# Patient Record
Sex: Female | Born: 1975 | Race: Black or African American | Hispanic: No | Marital: Married | Smoking: Never smoker
Health system: Southern US, Community
[De-identification: ages and names within clinical notes are randomized; demographics above are authoritative.]

## PROBLEM LIST (undated history)

## (undated) ENCOUNTER — Inpatient Hospital Stay (HOSPITAL_COMMUNITY): Payer: Self-pay

## (undated) DIAGNOSIS — O139 Gestational [pregnancy-induced] hypertension without significant proteinuria, unspecified trimester: Secondary | ICD-10-CM

## (undated) DIAGNOSIS — E119 Type 2 diabetes mellitus without complications: Secondary | ICD-10-CM

## (undated) DIAGNOSIS — IMO0002 Reserved for concepts with insufficient information to code with codable children: Secondary | ICD-10-CM

## (undated) DIAGNOSIS — R112 Nausea with vomiting, unspecified: Secondary | ICD-10-CM

## (undated) DIAGNOSIS — T7840XA Allergy, unspecified, initial encounter: Secondary | ICD-10-CM

## (undated) DIAGNOSIS — R011 Cardiac murmur, unspecified: Secondary | ICD-10-CM

## (undated) DIAGNOSIS — Z8719 Personal history of other diseases of the digestive system: Secondary | ICD-10-CM

## (undated) DIAGNOSIS — Z9889 Other specified postprocedural states: Secondary | ICD-10-CM

## (undated) DIAGNOSIS — D649 Anemia, unspecified: Secondary | ICD-10-CM

## (undated) DIAGNOSIS — D573 Sickle-cell trait: Secondary | ICD-10-CM

## (undated) DIAGNOSIS — H269 Unspecified cataract: Secondary | ICD-10-CM

## (undated) DIAGNOSIS — R87619 Unspecified abnormal cytological findings in specimens from cervix uteri: Secondary | ICD-10-CM

## (undated) DIAGNOSIS — L709 Acne, unspecified: Secondary | ICD-10-CM

## (undated) HISTORY — DX: Gestational (pregnancy-induced) hypertension without significant proteinuria, unspecified trimester: O13.9

## (undated) HISTORY — DX: Cardiac murmur, unspecified: R01.1

## (undated) HISTORY — DX: Type 2 diabetes mellitus without complications: E11.9

## (undated) HISTORY — DX: Sickle-cell trait: D57.3

## (undated) HISTORY — DX: Acne, unspecified: L70.9

## (undated) HISTORY — PX: OTHER SURGICAL HISTORY: SHX169

## (undated) HISTORY — DX: Reserved for concepts with insufficient information to code with codable children: IMO0002

## (undated) HISTORY — DX: Allergy, unspecified, initial encounter: T78.40XA

## (undated) HISTORY — DX: Unspecified cataract: H26.9

## (undated) HISTORY — DX: Unspecified abnormal cytological findings in specimens from cervix uteri: R87.619

---

## 2005-10-09 ENCOUNTER — Emergency Department (HOSPITAL_COMMUNITY): Admission: EM | Admit: 2005-10-09 | Discharge: 2005-10-09 | Payer: Self-pay | Admitting: Emergency Medicine

## 2006-05-26 DIAGNOSIS — IMO0002 Reserved for concepts with insufficient information to code with codable children: Secondary | ICD-10-CM

## 2006-05-26 HISTORY — DX: Reserved for concepts with insufficient information to code with codable children: IMO0002

## 2007-02-18 ENCOUNTER — Other Ambulatory Visit: Admission: RE | Admit: 2007-02-18 | Discharge: 2007-02-18 | Payer: Self-pay | Admitting: Gynecology

## 2007-12-07 ENCOUNTER — Other Ambulatory Visit: Admission: RE | Admit: 2007-12-07 | Discharge: 2007-12-07 | Payer: Self-pay | Admitting: Gynecology

## 2008-04-19 ENCOUNTER — Ambulatory Visit: Payer: Self-pay | Admitting: Women's Health

## 2008-06-19 ENCOUNTER — Ambulatory Visit (HOSPITAL_COMMUNITY): Admission: RE | Admit: 2008-06-19 | Discharge: 2008-06-19 | Payer: Self-pay | Admitting: Gynecology

## 2008-07-13 ENCOUNTER — Ambulatory Visit: Payer: Self-pay | Admitting: Women's Health

## 2008-07-28 ENCOUNTER — Ambulatory Visit: Payer: Self-pay | Admitting: Gynecology

## 2009-05-04 ENCOUNTER — Ambulatory Visit: Payer: Self-pay | Admitting: Women's Health

## 2009-10-12 ENCOUNTER — Emergency Department (HOSPITAL_COMMUNITY): Admission: EM | Admit: 2009-10-12 | Discharge: 2009-10-12 | Payer: Self-pay | Admitting: Emergency Medicine

## 2010-03-26 HISTORY — PX: GASTRIC BYPASS: SHX52

## 2010-06-16 ENCOUNTER — Encounter: Payer: Self-pay | Admitting: Gynecology

## 2010-09-15 ENCOUNTER — Emergency Department (HOSPITAL_COMMUNITY)
Admission: EM | Admit: 2010-09-15 | Discharge: 2010-09-15 | Disposition: A | Discharge status: Home / Self Care | Payer: 59 | Attending: Emergency Medicine | Admitting: Emergency Medicine

## 2010-09-15 DIAGNOSIS — J3489 Other specified disorders of nose and nasal sinuses: Secondary | ICD-10-CM | POA: Insufficient documentation

## 2010-09-15 DIAGNOSIS — J069 Acute upper respiratory infection, unspecified: Secondary | ICD-10-CM | POA: Insufficient documentation

## 2010-09-15 DIAGNOSIS — R05 Cough: Secondary | ICD-10-CM | POA: Insufficient documentation

## 2010-09-15 DIAGNOSIS — R070 Pain in throat: Secondary | ICD-10-CM | POA: Insufficient documentation

## 2010-09-15 DIAGNOSIS — R059 Cough, unspecified: Secondary | ICD-10-CM | POA: Insufficient documentation

## 2010-12-12 ENCOUNTER — Encounter: Payer: Self-pay | Admitting: *Deleted

## 2010-12-18 ENCOUNTER — Ambulatory Visit (INDEPENDENT_AMBULATORY_CARE_PROVIDER_SITE_OTHER): Payer: 59 | Admitting: Women's Health

## 2010-12-18 ENCOUNTER — Encounter: Payer: Self-pay | Admitting: Women's Health

## 2010-12-18 ENCOUNTER — Other Ambulatory Visit (HOSPITAL_COMMUNITY)
Admission: RE | Admit: 2010-12-18 | Discharge: 2010-12-18 | Disposition: A | Discharge status: Home / Self Care | Payer: 59 | Source: Ambulatory Visit | Attending: Obstetrics and Gynecology | Admitting: Obstetrics and Gynecology

## 2010-12-18 VITALS — BP 130/70 | Ht 62.0 in | Wt 192.0 lb

## 2010-12-18 DIAGNOSIS — R81 Glycosuria: Secondary | ICD-10-CM

## 2010-12-18 DIAGNOSIS — Z01419 Encounter for gynecological examination (general) (routine) without abnormal findings: Secondary | ICD-10-CM | POA: Insufficient documentation

## 2010-12-18 DIAGNOSIS — R82998 Other abnormal findings in urine: Secondary | ICD-10-CM

## 2010-12-18 NOTE — Progress Notes (Signed)
Melissa Braun January 07, 1976 [id number]    History:    The patient presents for annual exam.  35 year old MBF G0 monthly 28 day cycles for about 7 days to up to 7 days are cramping with some heavy flow has had a history of infertility. She does have a history of a normal HSG, husband with normal SA.  history of diabetes with hypertension she is currently on no medications for either now. He had gastric bypass surgery in November of 2011. She has lost over 60 pounds with diet exercise and healthy her lifestyle. She's currently sees Dr. Talmage Nap . She has  followup scheduled.   Past medical history, past surgical history, family history and social history were all reviewed and documented in the EPIC chart.   ROS:  A 14 point ROS was performed and pertinent positives and negatives are included in the history.  Exam:  Filed Vitals:   12/18/10 1537  BP: 130/70    General appearance:  Normal Head/Neck:  Normal, without cervical or supraclavicular adenopathy. Thyroid:  Symmetrical, normal in size, without palpable masses or nodularity. Respiratory  Effort:  Normal  Auscultation:  Clear without wheezing or rhonchi Cardiovascular  Auscultation:  Regular rate, without rubs, murmurs or gallops  Edema/varicosities:  Not grossly evident Abdominal  Masses/tenderness:  Soft,nontender, without masses, guarding or rebound.  Liver/spleen:  No organomegaly noted  Hernia:  None appreciated  Occult test:   Skin  Inspection:  Grossly normal  Palpation:  Grossly normal Neurologic/psychiatric  Orientation:  Normal with appropriate conversation.  Mood/affect:  Normal  Genitourinary with chaperone present    Breasts: Examined lying and sitting.     Right: Without masses, retractions,         discharge or axillary adenopathy.     Left: Without masses, retractions,         discharge or axillary adenopathy.   Inguinal/mons:  Normal without inguinal adenopathy  External genitalia:   Normal  BUS/Urethra/Skene's glands:  Normal  Bladder:  Normal  Vagina:  Normal  Cervix:  Normal  Uterus:  anteverted, normal in size, shape and contour.  Midline and mobile  Adnexa/parametria:     Rt: Without masses or tenderness.   Lt: Without masses or tenderness.  Anus and perineum: Normal  Digital rectal exam: Normal sphincter tone without palpated masses or       tenderness.   Assessment/Plan:  35 y.o. year old female for annual exam.   History of infertility. Her last hemoglobin A1c with less than 9 which is much better for her to need care with Dr. balanced. She's currently on folic acid and a multivitamin and will continue. Did review of she would miss a cycle to return to the office for a viability ultrasound she has recently adopted a 1-month-old daughter named Melissa Braun.  Reviewed the importance of exercise, calcium rich diet, SVTs,. Reviewed exercise in relationship to health and diabetes maintenance. Pap and UA only today, she has had her labs at her primary care.   Harrington Challenger MD, 4:33 PM 12/18/2010

## 2010-12-19 ENCOUNTER — Telehealth: Payer: Self-pay | Admitting: Women's Health

## 2010-12-19 DIAGNOSIS — N390 Urinary tract infection, site not specified: Secondary | ICD-10-CM

## 2010-12-19 MED ORDER — SULFAMETHOXAZOLE-TMP DS 800-160 MG PO TABS: 1.0000 | ORAL_TABLET | Freq: Two times a day (BID) | ORAL | Status: AC

## 2010-12-19 NOTE — Telephone Encounter (Signed)
PT. NOTIFIED RX ESCRIBED TO HER PHARMACY AND TO RETURN FOR TOC UA  AND ORDER IN PC.

## 2010-12-19 NOTE — Telephone Encounter (Signed)
Sherri left message to call you directly.  100,000 on urine culture.  Allergic levaqun    Septra DS i po bid for 3 d #6  Please call in for and check TOC ua in 2 weeeks.  Thanks    Harriett Sine

## 2010-12-24 ENCOUNTER — Telehealth: Payer: Self-pay | Admitting: Women's Health

## 2010-12-24 DIAGNOSIS — B373 Candidiasis of vulva and vagina: Secondary | ICD-10-CM

## 2010-12-24 MED ORDER — FLUCONAZOLE 150 MG PO TABS: 150.0000 mg | ORAL_TABLET | Freq: Once | ORAL | Status: AC

## 2010-12-24 NOTE — Telephone Encounter (Signed)
Telephone call to patient. Informed Pap smear was normal but positive for yeast. Diflucan 150 by mouth x1 dose called to her pharmacy.

## 2011-02-20 ENCOUNTER — Emergency Department (HOSPITAL_COMMUNITY)
Admission: EM | Admit: 2011-02-20 | Discharge: 2011-02-20 | Disposition: A | Discharge status: Home / Self Care | Payer: No Typology Code available for payment source | Attending: Emergency Medicine | Admitting: Emergency Medicine

## 2011-02-20 ENCOUNTER — Emergency Department (HOSPITAL_COMMUNITY): Payer: No Typology Code available for payment source

## 2011-02-20 DIAGNOSIS — M542 Cervicalgia: Secondary | ICD-10-CM | POA: Insufficient documentation

## 2011-02-20 DIAGNOSIS — S139XXA Sprain of joints and ligaments of unspecified parts of neck, initial encounter: Secondary | ICD-10-CM | POA: Insufficient documentation

## 2011-08-13 ENCOUNTER — Telehealth: Payer: Self-pay | Admitting: *Deleted

## 2011-08-13 NOTE — Telephone Encounter (Signed)
Pt had questions about a possible in grown hair in vaginal area. All question anserine pt has appointment on 08/14/11

## 2011-08-14 ENCOUNTER — Encounter: Payer: Self-pay | Admitting: Women's Health

## 2011-08-14 ENCOUNTER — Ambulatory Visit (INDEPENDENT_AMBULATORY_CARE_PROVIDER_SITE_OTHER): Payer: 59 | Admitting: Women's Health

## 2011-08-14 DIAGNOSIS — B373 Candidiasis of vulva and vagina: Secondary | ICD-10-CM

## 2011-08-14 DIAGNOSIS — L738 Other specified follicular disorders: Secondary | ICD-10-CM

## 2011-08-14 DIAGNOSIS — L739 Follicular disorder, unspecified: Secondary | ICD-10-CM

## 2011-08-14 MED ORDER — FLUCONAZOLE 150 MG PO TABS: ORAL_TABLET | ORAL | Status: DC

## 2011-08-14 MED ORDER — SULFAMETHOXAZOLE-TRIMETHOPRIM 400-80 MG PO TABS: 1.0000 | ORAL_TABLET | Freq: Two times a day (BID) | ORAL | Status: DC

## 2011-08-14 NOTE — Patient Instructions (Signed)
Folliculitis  °Folliculitis is an infection and inflammation of the hair follicles. Hair follicles become red and irritated. This inflammation is usually caused by bacteria. The bacteria thrive in warm, moist environments. This condition can be seen anywhere on the body.  °CAUSES °The most common cause of folliculitis is an infection by germs (bacteria). Fungal and viral infections can also cause the condition. Viral infections may be more common in people whose bodies are unable to fight disease well (weakened immune systems). Examples include people with: °· AIDS.  °· An organ transplant.  °· Cancer.  °People with depressed immune systems, diabetes, or obesity, have a greater risk of getting folliculitis than the general population. Certain chemicals, especially oils and tars, also can cause folliculitis. °SYMPTOMS °· An early sign of folliculitis is a small, white or yellow pus-filled, itchy lesion (pustule). These lesions appear on a red, inflamed follicle. They are usually less than 5 mm (.20 inches).  °· The most likely starting points are the scalp, thighs, legs, back and buttocks. Folliculitis is also frequently found in areas of repeated shaving.  °· When an infection of the follicle goes deeper, it becomes a boil or furuncle. A group of closely packed boils create a larger lesion (a carbuncle). These sores (lesions) tend to occur in hairy, sweaty areas of the body.  °TREATMENT  °· A doctor who specializes in skin problems (dermatologists) treats mild cases of folliculitis with antiseptic washes.  °· They also use a skin application which kills germs (topical antibiotics). Tea tree oil is a good topical antiseptic as well. It can be found at a health food store. A small percentage of individuals may develop an allergy to the tea tree oil.  °· Mild to moderate boils respond well to warm water compresses applied three times daily.  °· In some cases, oral antibiotics should be taken with the skin treatment.    °· If lesions contain large quantities of pus or fluid, your caregiver may drain them. This allows the topical antibiotics to get to the affected areas better.  °· Stubborn cases of folliculitis may respond to laser hair removal. This process uses a high intensity light beam (a laser) to destroy the follicle and reduces the scarring from folliculitis. After laser hair removal, hair will no longer grow in the laser treated area.  °Patients with long-lasting folliculitis need to find out where the infection is coming from. Germs can live in the nostrils of the patient. This can trigger an outbreak now and then. Sometimes the bacteria live in the nostrils of a family member. This person does not develop the disorder but they repeatedly re-expose others to the germ. To break the cycle of recurrence in the patient, the family member must also undergo treatment. °PREVENTION  °· Individuals who are predisposed to folliculitis should be extremely careful about personal hygiene.  °· Application of antiseptic washes may help prevent recurrences.  °· A topical antibiotic cream, mupirocin (Bactroban®), has been effective at reducing bacteria in the nostrils. It is applied inside the nose with your little finger. This is done twice daily for a week. Then it is repeated every 6 months.  °· Because follicle disorders tend to come back, patients must receive follow-up care. Your caregiver may be able to recognize a recurrence before it becomes severe.  °SEEK IMMEDIATE MEDICAL CARE IF:  °· You develop redness, swelling, or increasing pain in the area.  °· You have a fever.  °· You are not improving with treatment   or are getting worse.  °· You have any other questions or concerns.  °Document Released: 07/21/2001 Document Revised: 05/01/2011 Document Reviewed: 05/17/2008 °[employer]® Patient Information ©2012 [employer]. °

## 2011-08-14 NOTE — Progress Notes (Signed)
Patient ID: Melissa Braun, female   DOB: 08-21-1975, 36 y.o.   MRN: [id number] Presents with complaint of painful ingrown hair on the mons pubis and questionable infection under her right axilla. History of diabetes currently on no medications due to weight loss resolving problem. Denies fever. Requested area to be opened and drained due to pain. States area started approximately one week ago after shaving and has gotten progressively larger and more tender.  Exam: Right axilla 2 cm slightly tender nonfluctuant folliculitis. On mons pubis 5 cm, indurated erythematous area, tender to touch. External genitalia extremely erythematous. Speculum exam cervix is pink moderate amount of a white discharge. Wet prep positive for yeast.Mons pubis prepped with Betadine, 2% plain Xylocaine used to anesthetize the area #11 blade used to open, drained small amount of serous drainage.  Folliculitis Yeast  Plan: Check random glucose. Septra DS one by mouth twice a day for 10 days, Diflucan 150 by mouth daily for 3 days with a refill. Instructed to soak in warm tub twice daily, loose clothes, Polysporin. Instructed to call if no relief or area not responding in 3 days. Patient felt better when leaving.

## 2011-08-15 LAB — GLUCOSE, RANDOM: Glucose, Bld: 314 mg/dL — ABNORMAL HIGH (ref 70–99)

## 2011-08-17 ENCOUNTER — Encounter (HOSPITAL_COMMUNITY): Payer: Self-pay | Admitting: *Deleted

## 2011-08-17 ENCOUNTER — Emergency Department (HOSPITAL_COMMUNITY)
Admission: EM | Admit: 2011-08-17 | Discharge: 2011-08-18 | Disposition: A | Discharge status: Home / Self Care | Payer: 59 | Attending: Emergency Medicine | Admitting: Emergency Medicine

## 2011-08-17 DIAGNOSIS — E119 Type 2 diabetes mellitus without complications: Secondary | ICD-10-CM | POA: Insufficient documentation

## 2011-08-17 DIAGNOSIS — L02412 Cutaneous abscess of left axilla: Secondary | ICD-10-CM

## 2011-08-17 DIAGNOSIS — X58XXXA Exposure to other specified factors, initial encounter: Secondary | ICD-10-CM | POA: Insufficient documentation

## 2011-08-17 DIAGNOSIS — R062 Wheezing: Secondary | ICD-10-CM | POA: Insufficient documentation

## 2011-08-17 DIAGNOSIS — IMO0002 Reserved for concepts with insufficient information to code with codable children: Secondary | ICD-10-CM | POA: Insufficient documentation

## 2011-08-17 DIAGNOSIS — I10 Essential (primary) hypertension: Secondary | ICD-10-CM | POA: Insufficient documentation

## 2011-08-17 DIAGNOSIS — L02411 Cutaneous abscess of right axilla: Secondary | ICD-10-CM

## 2011-08-17 DIAGNOSIS — L509 Urticaria, unspecified: Secondary | ICD-10-CM | POA: Insufficient documentation

## 2011-08-17 DIAGNOSIS — T7840XA Allergy, unspecified, initial encounter: Secondary | ICD-10-CM | POA: Insufficient documentation

## 2011-08-17 MED ORDER — DIPHENHYDRAMINE HCL 25 MG PO CAPS
50.0000 mg | ORAL_CAPSULE | Freq: Once | ORAL | Status: AC
  Administered 2011-08-17: 50 mg via ORAL
  Filled 2011-08-17: qty 2

## 2011-08-17 NOTE — ED Notes (Signed)
Pt has been taking antibiotics and has been having increasing hives.  Pt has generalized rash, hives.  No sob

## 2011-08-18 MED ORDER — PREDNISONE 20 MG PO TABS
60.0000 mg | ORAL_TABLET | Freq: Once | ORAL | Status: AC
  Administered 2011-08-18: 60 mg via ORAL
  Filled 2011-08-18: qty 3

## 2011-08-18 MED ORDER — CLINDAMYCIN HCL 150 MG PO CAPS: 300.0000 mg | ORAL_CAPSULE | Freq: Three times a day (TID) | ORAL | Status: AC

## 2011-08-18 MED ORDER — FAMOTIDINE 20 MG PO TABS
20.0000 mg | ORAL_TABLET | Freq: Once | ORAL | Status: AC
  Administered 2011-08-18: 20 mg via ORAL
  Filled 2011-08-18: qty 1

## 2011-08-18 MED ORDER — PREDNISONE 20 MG PO TABS: 40.0000 mg | ORAL_TABLET | Freq: Every day | ORAL | Status: AC

## 2011-08-18 NOTE — ED Provider Notes (Signed)
History     CSN: [id number]  Arrival date & time 08/17/11  2113   First MD Initiated Contact with Patient 08/18/11 0012      Chief Complaint  Patient presents with  . Allergic Reaction    (Consider location/radiation/quality/duration/timing/severity/associated sxs/prior treatment) Patient is a 36 y.o. female presenting with allergic reaction. The history is provided by the patient.  Allergic Reaction The primary symptoms are  wheezing, rash and urticaria. The primary symptoms do not include shortness of breath, cough, nausea, vomiting, dizziness, palpitations or angioedema. The current episode started more than 2 days ago. The problem has been gradually improving. This is a new problem.  The onset of the reaction was associated with a new medication.   patient reports she was started on Septra this past Thursday for an infected hair follicle by her GYN physician. After 24 hours of the Septra patient developed severe rash and itching. She calls GYN back, Septra was stopped and patient was started on Doxycycline on radiate millimeters images it appears that she can by the time patient started the doxycycline on Saturday her itching and rash had almost resolved. After second dose of doxycycline patient had return of rash and severe itching. Prior to arrival patient with severe itching to torso upper extremities upper legs and palms of bilateral hands. Rash and itching spared face. No significant swelling noted patient admits that the infected hair follicle on her pubic area has greatly improved. But now she is concerned regarding the fact that she doesn't feel safe continuing either of these antibiotics. Patient also has access to her right axilla and to her left anterior axilla that are TTP as well.   Past Medical History  Diagnosis Date  . Diabetes mellitus AGE 41    DR Talmage Nap  . Infertility   . Hypertension   . Acne   . LGSIL (low grade squamous intraepithelial dysplasia) 2008    Past  Surgical History  Procedure Date  . Exploratory laparoscopy     DUE TO RLQ PAIN  . Gastric bypass nov. 2011    [facility] [place]    Family History  Problem Relation Age of Onset  . Diabetes Father   . Hypertension Father   . Cancer Father     prostate    History  Substance Use Topics  . Smoking status: Never Smoker   . Smokeless tobacco: Never Used  . Alcohol Use: No    OB History    Grav Para Term Preterm Abortions TAB SAB Ect Mult Living   0               Review of Systems  Constitutional: Negative.   HENT: Negative.   Eyes: Negative.   Respiratory: Positive for wheezing. Negative for cough, chest tightness and shortness of breath.   Cardiovascular: Negative.  Negative for palpitations.  Gastrointestinal: Negative.  Negative for nausea and vomiting.  Genitourinary: Negative.   Musculoskeletal: Negative.   Skin: Positive for rash.  Neurological: Negative.  Negative for dizziness.  Hematological: Negative.   Psychiatric/Behavioral: Negative.     Allergies  Bactrim; Doxycycline; and Levaquin  Home Medications   Current Outpatient Rx  Name Route Sig Dispense Refill  . AMPICILLIN 250 MG PO CAPS Oral Take 250 mg by mouth 2 (two) times daily.      Marland Kitchen DOXYCYCLINE HYCLATE 100 MG PO CAPS Oral Take 100 mg by mouth 2 (two) times daily.    Marland Kitchen FOLIC ACID PO Oral Take 1 tablet by mouth daily.     Marland Kitchen  ONE-DAILY MULTI VITAMINS PO TABS Oral Take 1 tablet by mouth daily.      . SULFAMETHOXAZOLE-TRIMETHOPRIM 400-80 MG PO TABS Oral Take 1 tablet by mouth 2 (two) times daily.      BP 137/58  Pulse 120  Temp 98.7 F (37.1 C)  Resp 22  SpO2 100%  LMP 07/25/2011  Physical Exam  Constitutional: She appears well-developed and well-nourished.  HENT:  Head: Normocephalic and atraumatic.  Eyes: Conjunctivae are normal.  Neck: Neck supple.  Cardiovascular: Normal rate and regular rhythm.   Pulmonary/Chest: Effort normal and breath sounds normal.  Abdominal: Soft. Bowel sounds are  normal.  Musculoskeletal: Normal range of motion.  Neurological: She is alert.  Skin: Skin is warm and dry. Rash noted. Rash is urticarial. No erythema.       Raised red rash noted to lower back consistent with urticaria. Palmar aspects of bilateral hands noted to be very red.  Abscess of mons pubis is draining and soft and patient admits much improved. There is an abscess anteriorly to the right axilla that is firm, raised (approximately 2 x 2 in size) with minimal redness, very firm to touch without palpable fluctuance.  The abscess anterior to the left axilla is also raised, minimal to no erythema and appears to be a resolving abscess as there is no palpable fluctuance and minimal TTP.  Psychiatric: She has a normal mood and affect.    ED Course  Procedures   Findings and clinical impression discussed with patient. Will plan to discharge home on clindamycin and encourage followup either here or at the home urgent care in 2 days for recheck of these areas. Patient is to return sooner if her allergic symptoms return and worsen. We'll treat with prednisone and Pepcid and Benadryl for one week. Patient agreeable with plan.   Labs Reviewed - No data to display No results found.   No diagnosis found.    MDM  HPI/PE and clinical findings c/w 1. Allergic reaction (symptoms much improved after Benadryl. Minimal itching and rash at time of discharge, patient denies significant swelling, shortness of breath or wheezing at any time during the symptoms. Will reat with Prednisone, Pepcid and Benadryl x1 week and change patient's antibiotic. She is to follow up in 2 days for recheck either here or at [facility]. 2. Multiple abscesses (patient with a resolving abscess to the mons pubis, almost completely resolved abscess to anterior left axilla, approximately 2 x 2 centimeter raised, firm abscess to anterior right axilla that is TTP without palpable fluctuance. Abscesses to bilateral axilla  circled for reevaluation in 2 days after 2 days of clindamycin.)        Leanne Chang, NP 08/18/11 1610

## 2011-08-18 NOTE — ED Provider Notes (Signed)
Medical screening examination/treatment/procedure(s) were performed by non-physician practitioner and as supervising physician I was immediately available for consultation/collaboration.  Rhema Boyett K Sadik Piascik-Rasch, MD 08/18/11 0742 

## 2011-08-18 NOTE — Discharge Instructions (Signed)
Please review the instructions below. Your were evaluated in the emergency department tonight for your itchy rash. Your symptoms are consistent with and allergic reaction. I would stop the Septra and Doxycycline and consider yourself allergic to these medications in the future. Take the Prednisone as directed daily for 5 days. Get over-the-counter Pepcid and take 20 mg 2 times a day for 5 days. Also get over-the-counter Benadryl and take 25-50 mg every 4-6 hours as needed for itching. Try to take the Benadryl as regularly as possible for the next 2-3 days. If your symptoms worsen return immediately. Otherwise return either here or to the [facility] in 2 days so that we can reassess your allergic symptoms as well as the status of your multiple abscesses after 2 days of the new antibiotic.    Abscess An abscess (boil or furuncle) is an infected area under your skin. This area is filled with yellowish white fluid (pus). HOME CARE   Only take medicine as told by your doctor.   Keep the skin clean around your abscess. Keep clothes that may touch the abscess clean.   Change any bandages (dressings) as told by your doctor.   Avoid direct skin contact with other people. The infection can spread by skin contact with others.   Practice good hygiene and do not share personal care items.   Do not share athletic equipment, towels, or whirlpools. Shower after every practice or work out session.   If a draining area cannot be covered:   Do not play sports.   Children should not go to daycare until the wound has healed or until fluid (drainage) stops coming out of the wound.   See your doctor for a follow-up visit as told.  GET HELP RIGHT AWAY IF:   There is more pain, puffiness (swelling), and redness in the wound site.   There is fluid or bleeding from the wound site.   You have muscle aches, chills, fever, or feel sick.   You or your child has a temperature by mouth above 102 F (38.9  C), not controlled by medicine.   Your baby is older than 3 months with a rectal temperature of 102 F (38.9 C) or higher.  MAKE SURE YOU:   Understand these instructions.   Will watch your condition.   Will get help right away if you are not doing well or get worse.  Document Released: 10/29/2007 Document Revised: 05/01/2011 Document Reviewed: 10/29/2007 [facility] Patient Information 2012 [place], [place].Allergic Reaction Allergic reactions can be caused by anything your body is sensitive to. Your body may be sensitive to food, medicines, molds, pollens, cockroaches, dust mites, pets, insect stings, and other things around you. An allergic reaction may cause puffiness (swelling), itching, sneezing, coughing, or problems breathing.  Allergies cannot be cured, but they can be controlled with medicine. Some allergies happen only at certain times of the year. Try to stay away from what causes your reaction if possible. Sometimes, it is hard to tell what causes your reaction. HOME CARE If you have a rash or red patches (hives) on your skin:  Take medicines as told by your doctor.   Do not drive or drink alcohol after taking medicines. They can make you sleepy.   Put cold cloths on your skin. Take baths in cool water. This will help your itching. Do not take hot baths or showers. Heat will make the itching worse.   If your allergies get worse, your doctor might give you  other medicines. Talk to your doctor if problems continue.  GET HELP RIGHT AWAY IF:   You have trouble breathing.   You have a tight feeling in your chest or throat.   Your mouth gets puffy (swollen).   You have red, itchy patches on your skin (hives) that get worse.   You have itching all over your body.  MAKE SURE YOU:   Understand these instructions.   Will watch your condition.   Will get help right away if you are not doing well or get worse.  Document Released: 04/30/2009 Document Revised: 05/01/2011  Document Reviewed: 04/30/2009 [facility] Patient Information 2012 [place], [place].

## 2011-08-20 ENCOUNTER — Telehealth: Payer: Self-pay | Admitting: *Deleted

## 2011-08-20 NOTE — Telephone Encounter (Signed)
Message copied by Libby Maw on Wed Aug 20, 2011 11:58 AM ------      Message from: [place], [place] J      Created: Mon Aug 18, 2011  8:21 AM       Margorie Renner, please schedule appointment with Dr. Talmage Nap. Patient out of town week of April 1. Late afternoon best for patient. HAs seen in the past, had been on insulin and in the past, blood sugar now 314 on no medication. Had major weight loss

## 2011-08-20 NOTE — Telephone Encounter (Signed)
appt set up with Dr. Talmage Nap on 09/09/11 @ 12:15. Records sent.

## 2011-11-23 ENCOUNTER — Emergency Department (HOSPITAL_COMMUNITY)
Admission: EM | Admit: 2011-11-23 | Discharge: 2011-11-23 | Disposition: A | Discharge status: Home / Self Care | Payer: 59 | Attending: Emergency Medicine | Admitting: Emergency Medicine

## 2011-11-23 ENCOUNTER — Encounter (HOSPITAL_COMMUNITY): Payer: Self-pay | Admitting: Emergency Medicine

## 2011-11-23 DIAGNOSIS — Z794 Long term (current) use of insulin: Secondary | ICD-10-CM | POA: Insufficient documentation

## 2011-11-23 DIAGNOSIS — R4182 Altered mental status, unspecified: Secondary | ICD-10-CM | POA: Insufficient documentation

## 2011-11-23 DIAGNOSIS — E1169 Type 2 diabetes mellitus with other specified complication: Secondary | ICD-10-CM | POA: Insufficient documentation

## 2011-11-23 DIAGNOSIS — E162 Hypoglycemia, unspecified: Secondary | ICD-10-CM

## 2011-11-23 LAB — BASIC METABOLIC PANEL
CO2: 24 mEq/L (ref 19–32)
Calcium: 8.9 mg/dL (ref 8.4–10.5)
Chloride: 105 mEq/L (ref 96–112)
Glucose, Bld: 58 mg/dL — ABNORMAL LOW (ref 70–99)
Sodium: 141 mEq/L (ref 135–145)

## 2011-11-23 LAB — CBC WITH DIFFERENTIAL/PLATELET
Basophils Absolute: 0 10*3/uL (ref 0.0–0.1)
Eosinophils Relative: 1 % (ref 0–5)
HCT: 35.1 % — ABNORMAL LOW (ref 36.0–46.0)
Lymphocytes Relative: 14 % (ref 12–46)
Lymphs Abs: 2 10*3/uL (ref 0.7–4.0)
MCV: 71.2 fL — ABNORMAL LOW (ref 78.0–100.0)
Monocytes Absolute: 0.7 10*3/uL (ref 0.1–1.0)
Neutro Abs: 11.3 10*3/uL — ABNORMAL HIGH (ref 1.7–7.7)
Platelets: 201 10*3/uL (ref 150–400)
RBC: 4.93 MIL/uL (ref 3.87–5.11)
WBC: 14.2 10*3/uL — ABNORMAL HIGH (ref 4.0–10.5)

## 2011-11-23 LAB — GLUCOSE, CAPILLARY
Glucose-Capillary: 64 mg/dL — ABNORMAL LOW (ref 70–99)
Glucose-Capillary: 108 mg/dL — ABNORMAL HIGH (ref 70–99)

## 2011-11-23 MED ORDER — DEXTROSE 50 % IV SOLN
INTRAVENOUS | Status: AC
  Administered 2011-11-23: 04:00:00
  Filled 2011-11-23: qty 50

## 2011-11-23 NOTE — ED Provider Notes (Signed)
I assumed care of patient from Dr. Bebe Shaggy at 7 AM. Decreased mental status secondary to hypoglycemia. Feeling better.  Has had 3 BS >100 and feels ready to go home.  BP 123/69  Pulse 87  Temp 97.5 F (36.4 C) (Oral)  Resp 13  SpO2 100%   Glynn Octave, MD 11/23/11 [phone]

## 2011-11-23 NOTE — ED Notes (Signed)
Patient given orange juice to drink.

## 2011-11-23 NOTE — Discharge Instructions (Signed)
Low Blood Sugar Low blood sugar (hypoglycemia) means that the level of sugar in your blood is lower than it should be. Signs of low blood sugar include:  Getting sweaty.   Feeling hungry.   Feeling dizzy or weak.   Feeling sleepier than normal.   Feeling nervous.   Headaches.   Having a fast heartbeat.  Low blood sugar can happen fast and can be an emergency. Your doctor can do tests to check your blood sugar level. You can have low blood sugar and not have diabetes. HOME CARE  Check your blood sugar as told by your doctor. If it is less than 70 mg/dl or as told by your doctor, take 1 of the following:   3 to 4 glucose tablets.    cup clear juice.    cup soda pop, not diet.   1 cup milk.   5 to 6 hard candies.   Recheck blood sugar after 15 minutes. Repeat until it is at the right level.   Eat a snack if it is more than 1 hour until the next meal.   Only take medicine as told by your doctor.   Do not skip meals. Eat on time.   Do not drink alcohol except with meals.   Check your blood glucose before driving.   Check your blood glucose before and after exercise.   Always carry treatment with you, such as glucose pills.   Always wear a medical alert bracelet if you have diabetes.  GET HELP RIGHT AWAY IF:   Your blood glucose goes below 70 mg/dl or as told by your doctor, and you:   Are confused.   Are not able to swallow.   Pass out (faint).   You cannot treat yourself. You may need someone to help you.   You have low blood sugar problems often.   You have problems from your medicines.   You are not feeling better after 3 to 4 days.   You have vision changes.  MAKE SURE YOU:   Understand these instructions.   Will watch this condition.   Will get help right away if you are not doing well or get worse.  Document Released: 08/06/2009 Document Revised: 05/01/2011 Document Reviewed: 08/06/2009 [facility] Patient Information 2012 [place],  [place].  BE SURE TO CHECK YOUR SUGAR FREQUENTLY TODAY.  PLEASE CALL YOUR DOCTOR FOR FURTHER INSTRUCTIONS ON YOUR INSULIN DOSING

## 2011-11-23 NOTE — ED Notes (Signed)
Report given to oncoming RN.

## 2011-11-23 NOTE — ED Notes (Signed)
Pt's CBG is 64 an letting Rn Steward Drone know.3:41am JG.

## 2011-11-23 NOTE — ED Notes (Signed)
Patient eating graham crackers with a cup of ginger ale.  Patient much more alert and awake at this time.  Patient has no other complaints.

## 2011-11-23 NOTE — ED Provider Notes (Signed)
History     CSN: [id number]  Arrival date & time 11/23/11  0325   First MD Initiated Contact with Patient 11/23/11 [phone]      Chief Complaint  Patient presents with  . Altered Mental Status     Patient is a 36 y.o. female presenting with altered mental status. The history is provided by the patient.  Altered Mental Status This is a new problem. Episode onset: an unknown time ago. The problem occurs constantly. The problem has been gradually improving. Pertinent negatives include no chest pain, no abdominal pain, no headaches and no shortness of breath. Nothing aggravates the symptoms. Relieved by: glucose. Treatments tried: glucose. The treatment provided mild relief.  pt presents for altered mental status Apparently EMS arrived checked glucose and it was 40 Given D50 with some improvement in mental status On arrival glucose was 64 Pt reports taking only insulin She denies cp/sob/headache/fever or recent infections  Past Medical History  Diagnosis Date  . Diabetes mellitus AGE 18    DR Talmage Nap  . Infertility   . Hypertension   . Acne   . LGSIL (low grade squamous intraepithelial dysplasia) 2008    Past Surgical History  Procedure Date  . Exploratory laparoscopy     DUE TO RLQ PAIN  . Gastric bypass nov. 2011    cary Hayti    Family History  Problem Relation Age of Onset  . Diabetes Father   . Hypertension Father   . Cancer Father     prostate    History  Substance Use Topics  . Smoking status: Never Smoker   . Smokeless tobacco: Never Used  . Alcohol Use: No    OB History    Grav Para Term Preterm Abortions TAB SAB Ect Mult Living   0               Review of Systems  Respiratory: Negative for shortness of breath.   Cardiovascular: Negative for chest pain.  Gastrointestinal: Negative for abdominal pain.  Neurological: Negative for headaches.  Psychiatric/Behavioral: Positive for altered mental status.  All other systems reviewed and are  negative.    Allergies  Bactrim; Doxycycline; and Levofloxacin  Home Medications   Current Outpatient Rx  Name Route Sig Dispense Refill  . AMPICILLIN 250 MG PO CAPS Oral Take 250 mg by mouth 2 (two) times daily.      Marland Kitchen DOXYCYCLINE HYCLATE 100 MG PO CAPS Oral Take 100 mg by mouth 2 (two) times daily.    Marland Kitchen FOLIC ACID PO Oral Take 1 tablet by mouth daily.     Marland Kitchen ONE-DAILY MULTI VITAMINS PO TABS Oral Take 1 tablet by mouth daily.        BP 132/72  Pulse 90  Temp 97.5 F (36.4 C) (Oral)  Resp 14  SpO2 98%  Physical Exam CONSTITUTIONAL: Well developed/well nourished HEAD AND FACE: Normocephalic/atraumatic EYES: EOMI/PERRL ENMT: Mucous membranes moist NECK: supple no meningeal signs SPINE:entire spine nontender CV: S1/S2 noted, no murmurs/rubs/gallops noted LUNGS: Lungs are clear to auscultation bilaterally, no apparent distress ABDOMEN: soft, nontender, no rebound or guarding GU:no cva tenderness NEURO: sleeping on arrival but easily arousable, no arm/leg drift, no facial droop noted EXTREMITIES: pulses normal, full ROM SKIN: warm, color normal PSYCH: no abnormalities of mood noted  ED Course  Procedures    Labs Reviewed  CBC WITH DIFFERENTIAL  BASIC METABOLIC PANEL   Pt with hypoglycemia that is improving Will follow closely 6:14 AM Mental status much improved Taking PO  6:26 AM Pt reports was well yesterday, took her evening insulin prior to bedtime Her husband (who is on the phone) noted that she was diaphoretic, confused at home.  EMS called and hypoglycemia noted She denies cp/sob/fever/weakness/dysuria/abdominal pain recently 8:06 AM WILL MONITOR IN ED ANOTHER HOUR THEN RECHECK GLUCOSE.  IF STILL ABOVE 100, WILL BE DISCHARGED HOME, D/W RANCOUR AT [facility]  MDM  Nursing notes including past medical history and social history reviewed and considered in documentation All labs/vitals reviewed and considered Previous records reviewed and considered - recent ED  visit reviewed         Joya Gaskins, MD 11/23/11 [phone]

## 2011-11-23 NOTE — ED Notes (Signed)
IV 18g in right AC was D/Cd

## 2011-11-23 NOTE — ED Notes (Signed)
Per EMS patient had altered mental status and CBG 40, 1 amp D50 given and recheck of 148.  Patient does not want to talk to staff.

## 2012-01-01 ENCOUNTER — Encounter: Payer: Self-pay | Admitting: Women's Health

## 2012-01-01 ENCOUNTER — Ambulatory Visit (INDEPENDENT_AMBULATORY_CARE_PROVIDER_SITE_OTHER): Payer: 59 | Admitting: Women's Health

## 2012-01-01 VITALS — BP 126/78 | Ht 62.0 in | Wt 175.0 lb

## 2012-01-01 DIAGNOSIS — Z01419 Encounter for gynecological examination (general) (routine) without abnormal findings: Secondary | ICD-10-CM

## 2012-01-01 DIAGNOSIS — E139 Other specified diabetes mellitus without complications: Secondary | ICD-10-CM | POA: Insufficient documentation

## 2012-01-01 DIAGNOSIS — E119 Type 2 diabetes mellitus without complications: Secondary | ICD-10-CM

## 2012-01-01 NOTE — Patient Instructions (Addendum)

## 2012-01-01 NOTE — Progress Notes (Signed)
Melissa Braun 1976/02/15 [id number]    History:    The patient presents for annual exam.  Regular monthly 6-7 day cycle using no contraception. History of normal Paps. Type I diabetic on insulin - Dr. Talmage Nap.   Past medical history, past surgical history, family history and social history were all reviewed and documented in the EPIC chart. Has 2 adopted children [name] age 36 and a son 83 months. Had gastric bypass surgery has lost approximately 100 pounds. Works at [employer].   ROS:  A  ROS was performed and pertinent positives and negatives are included in the history.  Exam:  Filed Vitals:   01/01/12 1559  BP: 126/78    General appearance:  Normal Head/Neck:  Normal, without cervical or supraclavicular adenopathy. Thyroid:  Symmetrical, normal in size, without palpable masses or nodularity. Respiratory  Effort:  Normal  Auscultation:  Clear without wheezing or rhonchi Cardiovascular  Auscultation:  Regular rate, without rubs, murmurs or gallops  Edema/varicosities:  Not grossly evident Abdominal  Soft,nontender, without masses, guarding or rebound.  Liver/spleen:  No organomegaly noted  Hernia:  None appreciated  Skin  Inspection:  Grossly normal  Palpation:  Grossly normal Neurologic/psychiatric  Orientation:  Normal with appropriate conversation.  Mood/affect:  Normal  Genitourinary    Breasts: Examined lying and sitting.     Right: Without masses, retractions, discharge or axillary adenopathy.     Left: Without masses, retractions, discharge or axillary adenopathy.   Inguinal/mons:  Normal without inguinal adenopathy  External genitalia:  Normal  BUS/Urethra/Skene's glands:  Normal  Bladder:  Normal  Vagina:  Normal  Cervix:  Normal  Uterus:   normal in size, shape and contour.  Midline and mobile  Adnexa/parametria:     Rt: Without masses or tenderness.   Lt: Without masses or tenderness.  Anus and perineum: Normal  Digital rectal exam: Normal sphincter  tone without palpated masses or tenderness  Assessment/Plan:  36 y.o. MBF G0  for annual exam with no complaints.  Normal GYN exam Type I diabetic since age 36-Dr. Talmage Nap labs and meds  Plan: Contraception reviewed declines if conceives will deal with it. States has tried to conceive for many years without success and has now adopted 2 children. SBE's, exercise, calcium rich diet, MVI daily encouraged. History of normal Paps, no Pap new screening guidelines reviewed.    Harrington Challenger [facility] [place], 4:58 PM 01/01/2012

## 2012-01-21 ENCOUNTER — Ambulatory Visit (INDEPENDENT_AMBULATORY_CARE_PROVIDER_SITE_OTHER): Payer: 59 | Admitting: Women's Health

## 2012-01-21 ENCOUNTER — Encounter: Payer: Self-pay | Admitting: Women's Health

## 2012-01-21 DIAGNOSIS — N912 Amenorrhea, unspecified: Secondary | ICD-10-CM

## 2012-01-21 NOTE — Progress Notes (Signed)
Patient ID: Melissa Braun, female   DOB: Mar 04, 1976, 36 y.o.   MRN: [id number] Presents with home positive U PT. LMP 12/21/11 normal cycle. History of monthly every 28 day cycles, using no contraception, history of infertility. Currently on insulin at at bedtime history of insulin-dependent diabetes diagnosed at age 78. Gastric Bypass surgery 11/11 with approximately 100 pound weight loss. Denies any bleeding, discharge, pain, or nausea. Pleased with pregnancy but surprised. Has 2 adopted children [name] 2-1/2 and a son 17 months.  Exam: Positive U PT External genitalia within normal limits, speculum exam no blood or discharge noted. Bimanual no CMT or adnexal fullness or tenderness, uterus palpates 6-8 weeks' size.  Early pregnancy/5 weeks 6 days per dates with  Insulin-dependent diabetes and history of gastric bypass  Plan: Refer to perinatologist. Dr. Sherrie [facility] at [facility] contacted and will contact patient for new OB appointment. Instructed to continue insulin, prenatal vitamin daily, and keep scheduled appointment.

## 2012-01-21 NOTE — Patient Instructions (Addendum)
No meds other than insulin, prenatal vit and plain tylenol or robitussinPregnancy If you are planning on getting pregnant, it is a good idea to make a preconception appointment with your care- giver to discuss having a healthy lifestyle before getting pregnant. Such as, diet, weight, exercise, taking prenatal vitamins especially folic acid (it helps prevent brain and spinal cord defects), avoiding alcohol, smoking and illegal drugs, medical problems (diabetes, convulsions), family history of genetic problems, working conditions and immunizations. It is better to have knowledge of these things and do something about them before getting pregnant. In your pregnancy, it is important to follow certain guidelines to have a healthy baby. It is very important to get good prenatal care and follow your caregiver's instructions. Prenatal care includes all the medical care you receive before your baby's birth. This helps to prevent problems during the pregnancy and childbirth. HOME CARE INSTRUCTIONS   Start your prenatal visits by the 12th week of pregnancy or before when possible. They are usually scheduled monthly at first. They are more often in the last 2 months before delivery. It is important that you keep your caregiver's appointments and follow your caregiver's instructions regarding medication use, exercise, and diet.   During pregnancy, you are providing food for you and your baby. Eat a regular, well-balanced diet. Choose foods such as meat, fish, milk and other dairy products, vegetables, fruits, whole-grain breads and cereals. Your caregiver will inform you of the ideal weight gain depending on your current height and weight. Drink lots of liquids. Try to drink 8 glasses of water a day.   Alcohol is associated with a number of birth defects including fetal alcohol syndrome. It is best to avoid alcohol completely. Smoking will cause low birth rate and prematurity. Use of alcohol and nicotine during your  pregnancy also increases the chances that your child will be chemically dependent later in their life and may contribute to SIDS (Sudden Infant Death Syndrome).   Do not use illegal drugs.   Only take prescription or over-the-counter medications that are recommended by your caregiver. Other medications can cause genetic and physical problems in the baby.   Morning sickness can often be helped by keeping soda crackers at the bedside. Eat a couple before arising in the morning.   A sexual relationship may be continued until near the end of pregnancy if there are no other problems such as early (premature) leaking of amniotic fluid from the membranes, vaginal bleeding, painful intercourse or belly (abdominal) pain.   Exercise regularly. Check with your caregiver if you are unsure of the safety of some of your exercises.   Do not use hot tubs, steam rooms or saunas. These increase the risk of fainting or passing out and hurting yourself and the baby. Swimming is OK for exercise. Get plenty of rest, including afternoon naps when possible especially in the third trimester.   Avoid toxic odors and chemicals.   Do not wear high heels. They may cause you to lose your balance and fall.   Do not lift over 5 pounds. If you do lift anything, lift with your legs and thighs, not your back.   Avoid long trips, especially in the third trimester.   If you have to travel out of the city or state, take a copy of your medical records with you.  SEEK IMMEDIATE MEDICAL CARE IF:   You develop an unexplained oral temperature above 102 F (38.9 C), or as your caregiver suggests.   You have leaking  of fluid from the vagina. If leaking membranes are suspected, take your temperature and inform your caregiver of this when you call.   There is vaginal spotting or bleeding. Notify your caregiver of the amount and how many pads are used.   You continue to feel sick to your stomach (nauseous) and have no relief from  remedies suggested, or you throw up (vomit) blood or coffee ground like materials.   You develop upper abdominal pain.   You have round ligament discomfort in the lower abdominal area. This still must be evaluated by your caregiver.   You feel contractions of the uterus.   You do not feel the baby move, or there is less movement than before.   You have painful urination.   You have abnormal vaginal discharge.   You have persistent diarrhea.   You get a severe headache.   You have problems with your vision.   You develop muscle weakness.   You feel dizzy and faint.   You develop shortness of breath.   You develop chest pain.   You have back pain that travels down to your leg and feet.   You feel irregular or a very fast heartbeat.   You develop excessive weight gain in a short period of time (5 pounds in 3 to 5 days).   You are involved with a domestic violence situation.  Document Released: 05/12/2005 Document Revised: 05/01/2011 Document Reviewed: 11/03/2008 [facility] Patient Information 2012 [place], [place].

## 2012-02-03 ENCOUNTER — Telehealth: Payer: Self-pay | Admitting: *Deleted

## 2012-02-03 NOTE — Telephone Encounter (Signed)
Pt called requesting copy of positive preg. Test  01/21/12. Pt filled out medical release, copy faxed to pt at [phone].

## 2012-02-23 ENCOUNTER — Other Ambulatory Visit: Payer: Self-pay | Admitting: Family Medicine

## 2012-02-23 ENCOUNTER — Ambulatory Visit (INDEPENDENT_AMBULATORY_CARE_PROVIDER_SITE_OTHER): Payer: 59 | Admitting: Family Medicine

## 2012-02-23 ENCOUNTER — Encounter: Payer: Self-pay | Admitting: Family Medicine

## 2012-02-23 ENCOUNTER — Encounter: Payer: 59 | Attending: Family Medicine | Admitting: Dietician

## 2012-02-23 VITALS — BP 127/74 | Temp 97.5°F | Ht 61.25 in | Wt 180.9 lb

## 2012-02-23 DIAGNOSIS — O24919 Unspecified diabetes mellitus in pregnancy, unspecified trimester: Secondary | ICD-10-CM | POA: Insufficient documentation

## 2012-02-23 DIAGNOSIS — O10019 Pre-existing essential hypertension complicating pregnancy, unspecified trimester: Secondary | ICD-10-CM

## 2012-02-23 DIAGNOSIS — O09529 Supervision of elderly multigravida, unspecified trimester: Secondary | ICD-10-CM

## 2012-02-23 DIAGNOSIS — IMO0002 Reserved for concepts with insufficient information to code with codable children: Secondary | ICD-10-CM

## 2012-02-23 DIAGNOSIS — O99840 Bariatric surgery status complicating pregnancy, unspecified trimester: Secondary | ICD-10-CM | POA: Insufficient documentation

## 2012-02-23 DIAGNOSIS — O0990 Supervision of high risk pregnancy, unspecified, unspecified trimester: Secondary | ICD-10-CM

## 2012-02-23 DIAGNOSIS — I10 Essential (primary) hypertension: Secondary | ICD-10-CM

## 2012-02-23 DIAGNOSIS — Z713 Dietary counseling and surveillance: Secondary | ICD-10-CM | POA: Insufficient documentation

## 2012-02-23 DIAGNOSIS — E669 Obesity, unspecified: Secondary | ICD-10-CM

## 2012-02-23 DIAGNOSIS — Z3682 Encounter for antenatal screening for nuchal translucency: Secondary | ICD-10-CM

## 2012-02-23 DIAGNOSIS — O99810 Abnormal glucose complicating pregnancy: Secondary | ICD-10-CM | POA: Insufficient documentation

## 2012-02-23 LAB — POCT URINALYSIS DIP (DEVICE)
Bilirubin Urine: NEGATIVE
Hgb urine dipstick: NEGATIVE
Nitrite: NEGATIVE
Specific Gravity, Urine: 1.015 (ref 1.005–1.030)
pH: 7 (ref 5.0–8.0)

## 2012-02-23 MED ORDER — INSULIN NPH (HUMAN) (ISOPHANE) 100 UNIT/ML ~~LOC~~ SUSP: 10.0000 [IU] | Freq: Two times a day (BID) | SUBCUTANEOUS | Status: DC

## 2012-02-23 MED ORDER — GLUCOSE BLOOD VI STRP: ORAL_STRIP | Status: DC

## 2012-02-23 MED ORDER — INSULIN ASPART 100 UNIT/ML ~~LOC~~ SOLN: 12.0000 [IU] | Freq: Three times a day (TID) | SUBCUTANEOUS | Status: DC

## 2012-02-23 MED ORDER — ACCU-CHEK FASTCLIX LANCETS MISC: 1.0000 [IU] | Freq: Four times a day (QID) | Status: DC

## 2012-02-23 NOTE — Progress Notes (Signed)
  Subjective:    Melissa Braun is a 36 y.o. G1P0 [redacted]w[redacted]d being seen today for her obstetrical visit.  Patient reports no complaints. Fetal movement: not yet. Discussed DM at length.  Pt. Has adopted 2 children secondary to infertility and lost 80#, this is a spontaneous pregnancy.  Objective:    BP 127/74  Temp 97.5 F (36.4 C)  Ht 5' 1.25" (1.556 m)  Wt 180 lb 14.4 oz (82.056 kg)  BMI 33.90 kg/m2  LMP 12/11/2011  Physical Exam  HEENT:  [place]/AT, sclera without icterus Neck:  Supple, normal thyroid Lungs:   Clear bilaterally CV:  Reg S1S2, no murmur Abdomen:  Soft, NT Ext: tr edema L>R Skin:  Acne noted on face and chest Breasts:  Symmetric with everted nipples, no mass, no supraclavicular or axillary lymphadenopathy. GU:  NEFG, BUS WNL, Cervix nulliparous without lesion, uterus 12-14 wk size, firm. Exam  FHT:  169  Uterine Size:  12-14 wks size  Presentation:  unknown     Assessment:    Pregnancy:  G1P0   Patient Active Problem List   Diagnosis Date Noted  . Diabetes mellitus, antepartum 02/23/2012    Priority: High  . Benign essential hypertension antepartum 02/23/2012    Priority: High  . AMA (advanced maternal age) multigravida 35+ 02/23/2012    Priority: Medium  . Gastric bypass status for obesity complicating pregnancy, childbirth, or the puerperium 02/23/2012    Priority: Medium  . Essential hypertension, benign 02/23/2012  . Obesity 02/23/2012  . Unspecified high-risk pregnancy 02/23/2012  . Diabetes 1.5, managed as type 1 01/01/2012     Plan:   baseline labs, Hgb A1C, TSH, 24 hour urine, Fetal ECHO, Opthalmology referral MFM referral with [facility] vs. Other for AMA. Serial u/s for growth, 2x/wk testing > 32 wks. New OB labs Pt. Desires natural childbirth if possible, discussed water birth opportunities.   Follow up in 2 Weeks.

## 2012-02-23 NOTE — Progress Notes (Signed)
Nutrition note: 1st visit consult Pt has type 2 DM, had gastric bypass in 2011, and has h/o high BP. Pt stated she lost 75-80# after having gastric bypass and does not really follow any of the diet advice anymore except pt doesn't eat fruit due to MDs recs to avoid. Pt reports her BS normally run 80-90 for fasting & ~126 for 22 pp. Pt has gained 5.9# @ [redacted]w[redacted]d, which is > expected so far. Pt reports eating 3 meals & 2 snacks/ d. Pt reports drinking water, a little milk, and decaf/ sugar-free soda & tea. Pt is taking PNV & will be starting new insulin regime.  Pt reports no N&V.  Pt reports she is not getting any physical activity currently. Pt given verbal & written GDM education. Disc wt gain goals of 11-20#.  Disc importance of BF & physical activity such as walking. Pt agrees to follow GDM diet including 3 meals & 3 snacks with proper CHO/ protein combination.  Pt does not receive WIC but plans to apply. Pt does plan to BF. F/u in 2-4 wks Blondell Reveal, MS, RD, LDN

## 2012-02-23 NOTE — Progress Notes (Signed)
Diabetes Education:  Currently has been taking the Lantus and Novolog insulin.  Instructed regarding the use of the Novolin N insulin and the insulin curve.  She is to take 10 units of the Novolin N (NPH) in the AM and at bedtime.  The Novolog is 12 units before meals.  Instructed in the method for mixing the Novolin N and the Novolog insulin in the AM pre-breakfast.  Provided an insulin Kit BD Lot [id number] Expiration 2016/04/21.  Ask that she take a pre-lunch glucose for the next week.  She is to monitor her blood glucose fasting and 2 hr after the first bite of each meal.  Provide blood glucose log for recording blood glucose.  Did review the diet in light of her gastric bypass.  She is to limit breakfast to 30 gm of CHO, snacks are 15 gm of CHO and all meals are at 30 gm of CHO with addition of the protein and non-starchy vegetables.  It appears that her diet has been less restricted following her gastric bypass as she has only lost 75 lb total with the surgery.  Wncouraged aiming for the 60-90 mg for fasting and <120 mg for the 2 hours post-meal.  Her husband was present for the session.  Maggie Pat Sires, RN, CDE

## 2012-02-23 NOTE — Patient Instructions (Addendum)
Following an appropriate diet is the most important thing to do for your health and that of your unborn baby. Please keep accurate BS logs and bring them with you to every visit.  Check BS four times a day. Your goal of fasting BS is < 90, and 2 hours after meals is < 120.  Melissa Braun is safe to use in pregnancy.  Stop taking Sudafed and metoprolol.  Pregnancy - First Trimester During sexual intercourse, millions of sperm go into the vagina. Only 1 sperm will penetrate and fertilize the female egg while it is in the Fallopian tube. One week later, the fertilized egg implants into the wall of the uterus. An embryo begins to develop into a baby. At 6 to 8 weeks, the eyes and face are formed and the heartbeat can be seen on ultrasound. At the end of 12 weeks (first trimester), all the baby's organs are formed. Now that you are pregnant, you will want to do everything you can to have a healthy baby. Two of the most important things are to get good prenatal care and follow your caregiver's instructions. Prenatal care is all the medical care you receive before the baby's birth. It is given to prevent, find, and treat problems during the pregnancy and childbirth. PRENATAL EXAMS  During prenatal visits, your weight, blood pressure and urine are checked. This is done to make sure you are healthy and progressing normally during the pregnancy.   A pregnant woman should gain 25 to 35 pounds during the pregnancy. However, if you are over weight or underweight, your caregiver will advise you regarding your weight.   Your caregiver will ask and answer questions for you.   Blood work, cervical cultures, other necessary tests and a Pap test are done during your prenatal exams. These tests are done to check on your health and the probable health of your baby. Tests are strongly recommended and done for HIV with your permission. This is the virus that causes AIDS. These tests are done because medications can be given to  help prevent your baby from being born with this infection should you have been infected without knowing it. Blood work is also used to find out your blood type, previous infections and follow your blood levels (hemoglobin).   Low hemoglobin (anemia) is common during pregnancy. Iron and vitamins are given to help prevent this. Later in the pregnancy, blood tests for diabetes will be done along with any other tests if any problems develop. You may need tests to make sure you and the baby are doing well.   You may need other tests to make sure you and the baby are doing well.  CHANGES DURING THE FIRST TRIMESTER (THE FIRST 3 MONTHS OF PREGNANCY) Your body goes through many changes during pregnancy. They vary from person to person. Talk to your caregiver about changes you notice and are concerned about. Changes can include:  Your menstrual period stops.   The egg and sperm carry the genes that determine what you look like. Genes from you and your partner are forming a baby. The female genes determine whether the baby is a boy or a girl.   Your body increases in girth and you may feel bloated.   Feeling sick to your stomach (nauseous) and throwing up (vomiting). If the vomiting is uncontrollable, call your caregiver.   Your breasts will begin to enlarge and become tender.   Your nipples may stick out more and become darker.   The need  to urinate more. Painful urination may mean you have a bladder infection.   Tiring easily.   Loss of appetite.   Cravings for certain kinds of food.   At first, you may gain or lose a couple of pounds.   You may have changes in your emotions from day to day (excited to be pregnant or concerned something may go wrong with the pregnancy and baby).   You may have more vivid and strange dreams.  HOME CARE INSTRUCTIONS   It is very important to avoid all smoking, alcohol and un-prescribed drugs during your pregnancy. These affect the formation and growth of the  baby. Avoid chemicals while pregnant to ensure the delivery of a healthy infant.   Start your prenatal visits by the 12th week of pregnancy. They are usually scheduled monthly at first, then more often in the last 2 months before delivery. Keep your caregiver's appointments. Follow your caregiver's instructions regarding medication use, blood and lab tests, exercise, and diet.   During pregnancy, you are providing food for you and your baby. Eat regular, well-balanced meals. Choose foods such as meat, fish, milk and other low fat dairy products, vegetables, fruits, and whole-grain breads and cereals. Your caregiver will tell you of the ideal weight gain.   You can help morning sickness by keeping soda crackers at the bedside. Eat a couple before arising in the morning. You may want to use the crackers without salt on them.   Eating 4 to 5 small meals rather than 3 large meals a day also may help the nausea and vomiting.   Drinking liquids between meals instead of during meals also seems to help nausea and vomiting.   A physical sexual relationship may be continued throughout pregnancy if there are no other problems. Problems may be early (premature) leaking of amniotic fluid from the membranes, vaginal bleeding, or belly (abdominal) pain.   Exercise regularly if there are no restrictions. Check with your caregiver or physical therapist if you are unsure of the safety of some of your exercises. Greater weight gain will occur in the last 2 trimesters of pregnancy. Exercising will help:   Control your weight.   Keep you in shape.   Prepare you for labor and delivery.   Help you lose your pregnancy weight after you deliver your baby.   Wear a good support or jogging bra for breast tenderness during pregnancy. This may help if worn during sleep too.   Ask when prenatal classes are available. Begin classes when they are offered.   Do not use hot tubs, steam rooms or saunas.   Wear your seat  belt when driving. This protects you and your baby if you are in an accident.   Avoid raw meat, uncooked cheese, cat litter boxes and soil used by cats throughout the pregnancy. These carry germs that can cause birth defects in the baby.   The first trimester is a good time to visit your dentist for your dental health. Getting your teeth cleaned is OK. Use a softer toothbrush and brush gently during pregnancy.   Ask for help if you have financial, counseling or nutritional needs during pregnancy. Your caregiver will be able to offer counseling for these needs as well as refer you for other special needs.   Do not take any medications or herbs unless told by your caregiver.   Inform your caregiver if there is any mental or physical domestic violence.   Make a list of emergency phone numbers  of family, friends, hospital, and police and fire departments.   Write down your questions. Take them to your prenatal visit.   Do not douche.   Do not cross your legs.   If you have to stand for long periods of time, rotate you feet or take small steps in a circle.   You may have more vaginal secretions that may require a sanitary pad. Do not use tampons or scented sanitary pads.  MEDICATIONS AND DRUG USE IN PREGNANCY  Take prenatal vitamins as directed. The vitamin should contain 1 milligram of folic acid. Keep all vitamins out of reach of children. Only a couple vitamins or tablets containing iron may be fatal to a baby or young child when ingested.   Avoid use of all medications, including herbs, over-the-counter medications, not prescribed or suggested by your caregiver. Only take over-the-counter or prescription medicines for pain, discomfort, or fever as directed by your caregiver. Do not use aspirin, ibuprofen, or naproxen unless directed by your caregiver.   Let your caregiver also know about herbs you may be using.   Alcohol is related to a number of birth defects. This includes fetal  alcohol syndrome. All alcohol, in any form, should be avoided completely. Smoking will cause low birth rate and premature babies.   Street or illegal drugs are very harmful to the baby. They are absolutely forbidden. A baby born to an addicted mother will be addicted at birth. The baby will go through the same withdrawal an adult does.   Let your caregiver know about any medications that you have to take and for what reason you take them.  MISCARRIAGE IS COMMON DURING PREGNANCY A miscarriage does not mean you did something wrong. It is not a reason to worry about getting pregnant again. Your caregiver will help you with questions you may have. If you have a miscarriage, you may need minor surgery. SEEK MEDICAL CARE IF:  You have any concerns or worries during your pregnancy. It is better to call with your questions if you feel they cannot wait, rather than worry about them. SEEK IMMEDIATE MEDICAL CARE IF:   An unexplained oral temperature above 102 F (38.9 C) develops, or as your caregiver suggests.   You have leaking of fluid from the vagina (birth canal). If leaking membranes are suspected, take your temperature and inform your caregiver of this when you call.   There is vaginal spotting or bleeding. Notify your caregiver of the amount and how many pads are used.   You develop a bad smelling vaginal discharge with a change in the color.   You continue to feel sick to your stomach (nauseated) and have no relief from remedies suggested. You vomit blood or coffee ground-like materials.   You lose more than 2 pounds of weight in 1 week.   You gain more than 2 pounds of weight in 1 week and you notice swelling of your face, hands, feet, or legs.   You gain 5 pounds or more in 1 week (even if you do not have swelling of your hands, face, legs, or feet).   You get exposed to [place] measles and have never had them.   You are exposed to fifth disease or chickenpox.   You develop belly  (abdominal) pain. Round ligament discomfort is a common non-cancerous (benign) cause of abdominal pain in pregnancy. Your caregiver still must evaluate this.   You develop headache, fever, diarrhea, pain with urination, or shortness of breath.   You  fall or are in a car accident or have any kind of trauma.   There is mental or physical violence in your home.  Document Released: 05/06/2001 Document Revised: 05/01/2011 Document Reviewed: 11/07/2008 [facility] Patient Information 2012 [place], [place]. Gestational Diabetes Mellitus Gestational diabetes mellitus (GDM) is diabetes that occurs only during pregnancy. This happens when the body cannot properly handle the glucose (sugar) that increases in the blood after eating. During pregnancy, insulin resistance (reduced sensitivity to insulin) occurs because of the release of hormones from the placenta. Usually, the pancreas of pregnant women produces enough insulin to overcome the resistance that occurs. However, in gestational diabetes, the insulin is there but it does not work effectively. If the resistance is severe enough that the pancreas does not produce enough insulin, extra glucose builds up in the blood.  WHO IS AT RISK FOR DEVELOPING GESTATIONAL DIABETES?  Women with a history of diabetes in the family.   Women over age 31.   Women who are overweight.   Women in certain ethnic groups (Hispanic, African American, Native American, [place] and [place] Islander).  WHAT CAN HAPPEN TO THE BABY? If the mother's blood glucose is too high while she is pregnant, the extra sugar will travel through the umbilical cord to the baby. Some of the problems the baby may have are:  Large Baby - If the baby receives too much sugar, the baby will gain more weight. This may cause the baby to be too large to be born normally (vaginally) and a Cesarean section (C-section) may be needed.   Low Blood Glucose (hypoglycemia) - The baby makes extra insulin, in response  to the extra sugar its gets from its mother. When the baby is born and no longer needs this extra insulin, the baby's blood glucose level may drop.   Jaundice (yellow coloring of the skin and eyes) - This is fairly common in babies. It is caused from a build-up of the chemical called bilirubin. This is rarely serious, but is seen more often in babies whose mothers had gestational diabetes.  RISKS TO THE MOTHER Women who have had gestational diabetes may be at higher risk for some problems, including:  Preeclampsia or toxemia, which includes problems with high blood pressure. Blood pressure and protein levels in the urine must be checked frequently.   Infections.   Cesarean section (C-section) for delivery.   Developing Type 2 diabetes later in life. About 30-50% will develop diabetes later, especially if obese.  DIAGNOSIS  The hormones that cause insulin resistance are highest at about 24-28 weeks of pregnancy. If symptoms are experienced, they are much like symptoms you would normally expect during pregnancy.  GDM is often diagnosed using a two part method: 1. After 24-28 weeks of pregnancy, the woman drinks a glucose solution and takes a blood test. If the glucose level is high, a second test will be given.  2. Oral Glucose Tolerance Test (OGTT) which is 3 hours long - After not eating overnight, the blood glucose is checked. The woman drinks a glucose solution, and hourly blood glucose tests are taken.  If the woman has risk factors for GDM, the caregiver may test earlier than 24 weeks of pregnancy. TREATMENT  Treatment of GDM is directed at keeping the mother's blood glucose level normal, and may include:  Meal planning.   Taking insulin or other medicine to control your blood glucose level.   Exercise.   Keeping a daily record of the foods you eat.   Blood  glucose monitoring and keeping a record of your blood glucose levels.   May monitor ketone levels in the urine, although this  is no longer considered necessary in most pregnancies.  HOME CARE INSTRUCTIONS  While you are pregnant:  Follow your caregiver's advice regarding your prenatal appointments, meal planning, exercise, medicines, vitamins, blood and other tests, and physical activities.   Keep a record of your meals, blood glucose tests, and the amount of insulin you are taking (if any). Show this to your caregiver at every prenatal visit.   If you have GDM, you may have problems with hypoglycemia (low blood glucose). You may suspect this if you become suddenly dizzy, feel shaky, and/or weak. If you think this is happening and you have a glucose meter, try to test your blood glucose level. Follow your caregiver's advice for when and how to treat your low blood glucose. Generally, the 15:15 rule is followed: Treat by consuming 15 grams of carbohydrates, wait 15 minutes, and recheck blood glucose. Examples of 15 grams of carbohydrates are:   1 cup skim or low-fat milk.    cup juice.   3-4 glucose tablets.   5-6 hard candies.   1 small box raisins.    cup regular soda pop.   Practice good hygiene, to avoid infections.   Do not smoke.  SEEK MEDICAL CARE IF:   You develop abnormal vaginal discharge, with or without itching.   You become weak and tired more than expected.   You seem to sweat a lot.   You have a sudden increase in weight, 5 pounds or more in one week.   You are losing weight, 3 pounds or more in a week.   Your blood glucose level is high, and you need instructions on what to do about it.  SEEK IMMEDIATE MEDICAL CARE IF:   You develop a severe headache.   You faint or pass out.   You develop nausea and vomiting.   You become disoriented or confused.   You have a convulsion.   You develop vision problems.   You develop stomach pain.   You develop vaginal bleeding.   You develop uterine contractions.   You have leaking or a gush of fluid from the vagina.  AFTER YOU  HAVE THE BABY:  Go to all of your follow-up appointments, and have blood tests as advised by your caregiver.   Maintain a healthy lifestyle, to prevent diabetes in the future. This includes:   Following a healthy meal plan.   Controlling your weight.   Getting enough exercise and proper rest.   Do not smoke.   Breastfeed your baby if you can. This will lower the chance of you and your baby developing diabetes later in life.  For more information about diabetes, go to the American Diabetes Association at: PMFashions.com.cy. For more information about gestational diabetes, go to the [employer] at: RentRule.com.au. Document Released: 08/18/2000 Document Revised: 05/01/2011 Document Reviewed: 03/12/2009 [facility] Patient Information 2012 [place], [place]. Breastfeeding BENEFITS OF BREASTFEEDING For the baby  The first milk (colostrum) helps the baby's digestive system function better.   There are antibodies from the mother in the milk that help the baby fight off infections.   The baby has a lower incidence of asthma, allergies, and SIDS (sudden infant death syndrome).   The nutrients in breast milk are better than formulas for the baby and helps the baby's brain grow better.   Babies who breastfeed have less  gas, colic, and constipation.  For the mother  Breastfeeding helps develop a very special bond between mother and baby.   It is more convenient, always available at the correct temperature and cheaper than formula feeding.   It burns calories in the mother and helps with losing weight that was gained during pregnancy.   It makes the uterus contract back down to normal size faster and slows bleeding following delivery.   Breastfeeding mothers have a lower risk of developing breast cancer.  NURSE FREQUENTLY  A healthy, full-term baby may breastfeed as often as every hour or space his or her feedings to every 3 hours.     How often to nurse will vary from baby to baby. Watch your baby for signs of hunger, not the clock.   Nurse as often as the baby requests, or when you feel the need to reduce the fullness of your breasts.   Awaken the baby if it has been 3 to 4 hours since the last feeding.   Frequent feeding will help the mother make more milk and will prevent problems like sore nipples and engorgement of the breasts.  BABY'S POSITION AT THE BREAST  Whether lying down or sitting, be sure that the baby's tummy is facing your tummy.   Support the breast with 4 fingers underneath the breast and the thumb above. Make sure your fingers are well away from the nipple and baby's mouth.   Stroke the baby's lips and cheek closest to the breast gently with your finger or nipple.   When the baby's mouth is open wide enough, place all of your nipple and as much of the dark area around the nipple as possible into your baby's mouth.   Pull the baby in close so the tip of the nose and the baby's cheeks touch the breast during the feeding.  FEEDINGS  The length of each feeding varies from baby to baby and from feeding to feeding.   The baby must suck about 2 to 3 minutes for your milk to get to him or her. This is called a "let down." For this reason, allow the baby to feed on each breast as long as he or she wants. Your baby will end the feeding when he or she has received the right balance of nutrients.   To break the suction, put your finger into the corner of the baby's mouth and slide it between his or her gums before removing your breast from his or her mouth. This will help prevent sore nipples.  REDUCING BREAST ENGORGEMENT  In the first week after your baby is born, you may experience signs of breast engorgement. When breasts are engorged, they feel heavy, warm, full, and may be tender to the touch. You can reduce engorgement if you:   Nurse frequently, every 2 to 3 hours. Mothers who breastfeed early and  often have fewer problems with engorgement.   Place light ice packs on your breasts between feedings. This reduces swelling. Wrap the ice packs in a lightweight towel to protect your skin.   Apply moist hot packs to your breast for 5 to 10 minutes before each feeding. This increases circulation and helps the milk flow.   Gently massage your breast before and during the feeding.   Make sure that the baby empties at least one breast at every feeding before switching sides.   Use a breast pump to empty the breasts if your baby is sleepy or not nursing well. You  may also want to pump if you are returning to work or or you feel you are getting engorged.   Avoid bottle feeds, pacifiers or supplemental feedings of water or juice in place of breastfeeding.   Be sure the baby is latched on and positioned properly while breastfeeding.   Prevent fatigue, stress, and anemia.   Wear a supportive bra, avoiding underwire styles.   Eat a balanced diet with enough fluids.  If you follow these suggestions, your engorgement should improve in 24 to 48 hours. If you are still experiencing difficulty, call your lactation consultant or caregiver. IS MY BABY GETTING ENOUGH MILK? Sometimes, mothers worry about whether their babies are getting enough milk. You can be assured that your baby is getting enough milk if:  The baby is actively sucking and you hear swallowing.   The baby nurses at least 8 to 12 times in a 24 hour time period. Nurse your baby until he or she unlatches or falls asleep at the first breast (at least 10 to 20 minutes), then offer the second side.   The baby is wetting 5 to 6 disposable diapers (6 to 8 cloth diapers) in a 24 hour period by 77 to 42 days of age.   The baby is having at least 2 to 3 stools every 24 hours for the first few months. Breast milk is all the food your baby needs. It is not necessary for your baby to have water or formula. In fact, to help your breasts make more milk,  it is best not to give your baby supplemental feedings during the early weeks.   The stool should be soft and yellow.   The baby should gain 4 to 7 ounces per week after he is 51 days old.  TAKE CARE OF YOURSELF Take care of your breasts by:  Bathing or showering daily.   Avoiding the use of soaps on your nipples.   Start feedings on your left breast at one feeding and on your right breast at the next feeding.   You will notice an increase in your milk supply 2 to 5 days after delivery. You may feel some discomfort from engorgement, which makes your breasts very firm and often tender. Engorgement "peaks" out within 24 to 48 hours. In the meantime, apply warm moist towels to your breasts for 5 to 10 minutes before feeding. Gentle massage and expression of some milk before feeding will soften your breasts, making it easier for your baby to latch on. Wear a well fitting nursing bra and air dry your nipples for 10 to 15 minutes after each feeding.   Only use cotton bra pads.   Only use pure lanolin on your nipples after nursing. You do not need to wash it off before nursing.  Take care of yourself by:   Eating well-balanced meals and nutritious snacks.   Drinking milk, fruit juice, and water to satisfy your thirst (about 8 glasses a day).   Getting plenty of rest.   Increasing calcium in your diet (1200 mg a day).   Avoiding foods that you notice affect the baby in a bad way.  SEEK MEDICAL CARE IF:   You have any questions or difficulty with breastfeeding.   You need help.   You have a hard, red, sore area on your breast, accompanied by a fever of 100.5 F (38.1 C) or more.   Your baby is too sleepy to eat well or is having trouble sleeping.   Your  baby is wetting less than 6 diapers per day, by 22 days of age.   Your baby's skin or white part of his or her eyes is more yellow than it was in the hospital.   You feel depressed.  Document Released: 05/12/2005 Document Revised:  05/01/2011 Document Reviewed: 12/25/2008 [facility] Patient Information 2012 [place], [place].

## 2012-02-23 NOTE — Progress Notes (Signed)
P=87, Given new patient information, discussed appropriate weight gain. Needs to see Diabetic Educator/Nutrition today, has been diabetic 17 years.  Referred here from [facility] due to history diabetes and gastric bypass surgery. Plans to get flu shot at work.

## 2012-02-23 NOTE — Progress Notes (Signed)
MFM appointment scheduled on March 09, 2012 at 8 am for Genetic Counseling and Development worker, international aid.

## 2012-02-27 ENCOUNTER — Encounter (HOSPITAL_COMMUNITY): Payer: Self-pay | Admitting: Family Medicine

## 2012-03-01 ENCOUNTER — Telehealth: Payer: Self-pay | Admitting: Medical

## 2012-03-01 LAB — CBC
HCT: 31.1 % — ABNORMAL LOW (ref 34.0–46.6)
MCH: 26.1 pg — ABNORMAL LOW (ref 26.6–33.0)
MCHC: 34.4 g/dL (ref 31.5–35.7)
MCV: 76 fL — ABNORMAL LOW (ref 79–97)
RDW: 22.6 % — ABNORMAL HIGH (ref 12.3–15.4)

## 2012-03-01 LAB — ANTIBODY SCREEN: Antibody Screen: NEGATIVE

## 2012-03-01 LAB — SPECIMEN STATUS REPORT

## 2012-03-01 LAB — HEMOGLOBIN A1C
Est. average glucose Bld gHb Est-mCnc: 166 mg/dL
Hgb A1c MFr Bld: 7.4 % — ABNORMAL HIGH (ref 4.8–5.6)

## 2012-03-01 LAB — PROTEIN, URINE, 24 HOUR: Protein, Ur: 7.2 mg/dL (ref 0.0–15.0)

## 2012-03-01 NOTE — Telephone Encounter (Signed)
Message copied by Freddi Starr on Mon Mar 01, 2012 11:52 AM ------      Message from: Reva Bores      Created: Thu Feb 26, 2012  5:30 PM       Can this be looked into---Will have to call [facility]

## 2012-03-01 NOTE — Telephone Encounter (Signed)
[facility]ed [facility] about issues with OB urine culture. Patient will need to Rx for Routine OB urine culture at next visit and can take this to [facility] for collection. Test could not be performed on previous urine sample as it must be complete within 48 hours of collection and patient was seen on 02/23/12.

## 2012-03-02 ENCOUNTER — Encounter: Payer: Self-pay | Admitting: Family Medicine

## 2012-03-02 LAB — HIV ANTIBODY (ROUTINE TESTING W REFLEX)
HIV 1/O/2 Abs-Index Value: <1 (ref <1.00)
HIV-1/HIV-2 Ab: NONREACTIVE

## 2012-03-02 LAB — COMPREHENSIVE METABOLIC PANEL
AST: 19 IU/L (ref 0–40)
Alkaline Phosphatase: 74 IU/L (ref 42–107)
BUN/Creatinine Ratio: 13 (ref 8–20)
CO2: 21 mmol/L (ref 19–28)
Chloride: 105 mmol/L (ref 97–108)
Sodium: 139 mmol/L (ref 134–144)
Total Bilirubin: 0.3 mg/dL (ref 0.0–1.2)

## 2012-03-02 LAB — TSH: TSH: 0.744 u[IU]/mL (ref 0.450–4.500)

## 2012-03-02 LAB — RPR: RPR: NONREACTIVE

## 2012-03-07 LAB — OB RESULTS CONSOLE RUBELLA ANTIBODY, IGM: Rubella: IMMUNE

## 2012-03-08 ENCOUNTER — Ambulatory Visit (INDEPENDENT_AMBULATORY_CARE_PROVIDER_SITE_OTHER): Payer: 59 | Admitting: Obstetrics & Gynecology

## 2012-03-08 VITALS — BP 132/79 | Temp 98.3°F | Wt 182.0 lb

## 2012-03-08 DIAGNOSIS — O24919 Unspecified diabetes mellitus in pregnancy, unspecified trimester: Secondary | ICD-10-CM

## 2012-03-08 LAB — POCT URINALYSIS DIP (DEVICE)
Bilirubin Urine: NEGATIVE
Ketones, ur: NEGATIVE mg/dL
Leukocytes, UA: NEGATIVE
Specific Gravity, Urine: 1.015 (ref 1.005–1.030)
pH: 6 (ref 5.0–8.0)

## 2012-03-08 NOTE — Progress Notes (Signed)
Pulse: 92

## 2012-03-08 NOTE — Progress Notes (Signed)
Fastings 85-120; mostly below 90.  2 hour pp after breakfast mostly below 120.  2 hr pp lunch 120-150.  2 hr pp dinner 160-180.  Will increase before lunch and dinner novalog to 14.  Pt needs refills on strips but unsure of meter type.  Pt will take meter to pharmacy and request a refill.  Pt needs to bring CBG log.  Genetic screening tomorrow.

## 2012-03-08 NOTE — Patient Instructions (Signed)
Medication change:  Novalog 14 units before lunch and dinner.  Keep before breakfast 12 units of Novalog.  Keep the NPH (cloudy) at 10 units twice a day. Bring CBG log and meter to your next visit.

## 2012-03-09 ENCOUNTER — Other Ambulatory Visit: Payer: Self-pay

## 2012-03-09 ENCOUNTER — Ambulatory Visit (HOSPITAL_COMMUNITY)
Admission: RE | Admit: 2012-03-09 | Discharge: 2012-03-09 | Disposition: A | Discharge status: Home / Self Care | Payer: 59 | Source: Ambulatory Visit | Attending: Family Medicine | Admitting: Family Medicine

## 2012-03-09 VITALS — BP 144/83 | HR 93 | Wt 184.0 lb

## 2012-03-09 DIAGNOSIS — O10019 Pre-existing essential hypertension complicating pregnancy, unspecified trimester: Secondary | ICD-10-CM

## 2012-03-09 DIAGNOSIS — Z3689 Encounter for other specified antenatal screening: Secondary | ICD-10-CM | POA: Insufficient documentation

## 2012-03-09 DIAGNOSIS — I10 Essential (primary) hypertension: Secondary | ICD-10-CM

## 2012-03-09 DIAGNOSIS — E139 Other specified diabetes mellitus without complications: Secondary | ICD-10-CM

## 2012-03-09 DIAGNOSIS — O0990 Supervision of high risk pregnancy, unspecified, unspecified trimester: Secondary | ICD-10-CM

## 2012-03-09 DIAGNOSIS — O24919 Unspecified diabetes mellitus in pregnancy, unspecified trimester: Secondary | ICD-10-CM | POA: Insufficient documentation

## 2012-03-09 DIAGNOSIS — O351XX0 Maternal care for (suspected) chromosomal abnormality in fetus, not applicable or unspecified: Secondary | ICD-10-CM | POA: Insufficient documentation

## 2012-03-09 DIAGNOSIS — O3510X0 Maternal care for (suspected) chromosomal abnormality in fetus, unspecified, not applicable or unspecified: Secondary | ICD-10-CM | POA: Insufficient documentation

## 2012-03-09 DIAGNOSIS — IMO0002 Reserved for concepts with insufficient information to code with codable children: Secondary | ICD-10-CM

## 2012-03-09 DIAGNOSIS — Z3682 Encounter for antenatal screening for nuchal translucency: Secondary | ICD-10-CM

## 2012-03-09 DIAGNOSIS — O09529 Supervision of elderly multigravida, unspecified trimester: Secondary | ICD-10-CM

## 2012-03-09 DIAGNOSIS — E669 Obesity, unspecified: Secondary | ICD-10-CM

## 2012-03-09 NOTE — Progress Notes (Signed)
Genetic Counseling  High-Risk Gestation Note  Appointment Date:  03/09/2012 Referred By: Reva Bores, MD Date of Birth:  January 27, 1976 Partner: Grayland Jack  Pregnancy History: G1P0 Estimated Date of Delivery: 09/16/12 Estimated Gestational Age: [redacted]w[redacted]d  Mrs. Wallis Mart and her husband, Mr. Myfanwy Simkin, were seen for genetic counseling because of a maternal age of 66.   They were counseled regarding maternal age and the association with risk for chromosome conditions due to nondisjunction with aging of the ova.   We reviewed chromosomes, nondisjunction, and the associated 1 in 110 risk for fetal aneuploidy related to a maternal age of 36 y.o. at [redacted]w[redacted]d gestation.  They were counseled that the risk for aneuploidy decreases as gestational age increases, accounting for those pregnancies which spontaneously abort.  We specifically discussed Down syndrome (trisomy 72), trisomies 76 and 28, and sex chromosome aneuploidies (47,XXX and 47,XXY) including the common features and prognoses of each.   We reviewed available screening options including First screen, Quad screen, noninvasive prenatal testing (NIPT), and detailed ultrasound. They understand that screening tests are used to modify a patient's a priori risk for aneuploidy, typically based on age.  This estimate provides a pregnancy specific risk assessment.  Specifically, we discussed that NIPT analyzes cell free fetal DNA found in the maternal circulation. This test is not diagnostic for chromosome conditions, but can provide information regarding the presence or absence of extra fetal DNA for chromosomes 13, 18, 21, X, and Y, and missing fetal DNA for chromosome X and Y (Turner syndrome). Thus, it would not identify or rule out all genetic conditions. The reported detection rate is greater than 99% for Trisomy 21, greater than 98% for Trisomy 18, and is approximately 80% (8 out of 10) for Trisomy 13. The false positive rate is reported to be  less than 0.1% for any of these conditions.  In addition, we discussed that ~50-80% of fetuses with Down syndrome and up to 90-95% of fetuses with trisomy 18/13, when well visualized, have detectable anomalies or soft markers by detailed ultrasound (~18+ weeks gestation).   They were also counseled regarding diagnostic testing via CVS or amniocentesis.  We reviewed the risks, limitations, and benefits of each screening and diagnostic testing option. After consideration of all the options, and a clear understanding of the newness and limitations of NIPT, they elected to proceed with cell free fetal DNA testing. Those results will be available in 8-10 days.  They also expressed interest in having a nuchal translucency ultrasound.  An NT ultrasound was performed today (1.7 mm).  The report will be documented separately.  She also elected to return in ~[redacted] weeks gestation for a detailed anatomy ultrasound.   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  We discussed that SCA affects the shape and function of the red blood cell by producing abnormal hemoglobin. They were counseled that hemoglobin is a protein in the RBCs that carries oxygen to the body's organs. Individuals who have SCA have a changes within the genes that codes for hemoglobin. This couple was counseled that SCA is inherited in an autosomal recessive manner, and occurs when both copies of the hemoglobin gene are changed and produce an abnormal hemoglobin S. Typically, one abnormal gene for the production of hemoglobin S is inherited from each parent. A carrier of SCA has one altered copy of the gene for hemoglobin and one typical working copy. Carriers of recessive conditions typically do not have symptoms related to  the condition because they still have one functioning copy of the gene, and thus some production of the typical protein coded for by that gene.  Given the recessive inheritance, we discussed the importance of  understanding Mr. Studley's carrier status in order to accurately predict the risk of a hemoglobinopathy in the fetus. Mr. Jefcoat reported that he has had testing for SCA in the past and that he does NOT have SCT. He did not have medical records available to verify the reported information. We discussed that other hemoglobin variants (hemoglobin C), when combined with hemoglobin S, can cause a medically significant hemoglobinopathy. Mr. Skyles was offered the option of hemoglobin electrophoresis to determine whether he has any hemoglobin variant, including SCT. He declined this testing today. They understand that an accurate risk assessment cannot be performed without further information. However, assuming that Mr. Drum does not have SCT or any other hemoglobin variant, the fetus would not be expected to have an increased risk for a hemoglobinopathy.  We also reviewed the availability of newborn screening in [place] for hemoglobinopathies.   Both family histories were reviewed and found to be noncontributory for birth defects, mental retardation, and other known genetic conditions. Without further information regarding the provided family history, an accurate genetic risk cannot be calculated. Further genetic counseling is warranted if more information is obtained.  Mrs. Cunanan denied exposure to environmental toxins or chemical agents. She denied the use of alcohol, tobacco or street drugs. She denied significant viral illnesses during the course of her pregnancy. Her medical and surgical histories were contributory.  This patient stated that she has diabetes and controls her blood sugar with insulin.  We discussed the fact that women who have insulin dependent diabetes are at an increased risk to have a baby with a birth defect.  The increase in risk correlates with the level of blood sugar control during the pregnancy, particularly during organogenesis.  The increase in risk is for any type of birth  defect but is greatest for heart, limb, and neural tube defects.  The risk could be as high as 6-10% for individuals whose blood sugars are not well-controlled, but lower for women who have good blood sugar control throughout pregnancy.   I counseled this couple regarding the above risks and available options.  The approximate face-to-face time with the genetic counselor was 75 minutes.  Donald Prose, MS,  Certified Genetic Counselor

## 2012-03-10 ENCOUNTER — Encounter: Payer: Self-pay | Admitting: Family Medicine

## 2012-03-10 DIAGNOSIS — D573 Sickle-cell trait: Secondary | ICD-10-CM | POA: Insufficient documentation

## 2012-03-10 NOTE — Progress Notes (Signed)
Per Note in chart from [place]. Patient will need to take an rx to [facility] so that they can collect a urine for culture.

## 2012-03-11 ENCOUNTER — Emergency Department (HOSPITAL_COMMUNITY)
Admission: EM | Admit: 2012-03-11 | Discharge: 2012-03-11 | Disposition: A | Discharge status: Home / Self Care | Payer: 59 | Attending: Emergency Medicine | Admitting: Emergency Medicine

## 2012-03-11 ENCOUNTER — Encounter (HOSPITAL_COMMUNITY): Payer: Self-pay | Admitting: *Deleted

## 2012-03-11 DIAGNOSIS — Z794 Long term (current) use of insulin: Secondary | ICD-10-CM | POA: Insufficient documentation

## 2012-03-11 DIAGNOSIS — I10 Essential (primary) hypertension: Secondary | ICD-10-CM | POA: Insufficient documentation

## 2012-03-11 DIAGNOSIS — D573 Sickle-cell trait: Secondary | ICD-10-CM | POA: Insufficient documentation

## 2012-03-11 DIAGNOSIS — E1169 Type 2 diabetes mellitus with other specified complication: Secondary | ICD-10-CM | POA: Insufficient documentation

## 2012-03-11 DIAGNOSIS — Z888 Allergy status to other drugs, medicaments and biological substances status: Secondary | ICD-10-CM | POA: Insufficient documentation

## 2012-03-11 DIAGNOSIS — N979 Female infertility, unspecified: Secondary | ICD-10-CM | POA: Insufficient documentation

## 2012-03-11 DIAGNOSIS — E162 Hypoglycemia, unspecified: Secondary | ICD-10-CM

## 2012-03-11 LAB — BASIC METABOLIC PANEL
BUN: 8 mg/dL (ref 6–23)
CO2: 19 mEq/L (ref 19–32)
Chloride: 103 mEq/L (ref 96–112)
Creatinine, Ser: 0.31 mg/dL — ABNORMAL LOW (ref 0.50–1.10)
GFR calc Af Amer: >90 mL/min (ref >90)
Glucose, Bld: 119 mg/dL — ABNORMAL HIGH (ref 70–99)
Potassium: 3.2 mEq/L — ABNORMAL LOW (ref 3.5–5.1)

## 2012-03-11 LAB — URINALYSIS, ROUTINE W REFLEX MICROSCOPIC
Bilirubin Urine: NEGATIVE
Glucose, UA: >1000 mg/dL — AB
Hgb urine dipstick: NEGATIVE
Ketones, ur: 15 mg/dL — AB
Nitrite: NEGATIVE
Specific Gravity, Urine: 1.016 (ref 1.005–1.030)
pH: 6 (ref 5.0–8.0)

## 2012-03-11 LAB — URINE MICROSCOPIC-ADD ON

## 2012-03-11 LAB — CBC WITH DIFFERENTIAL/PLATELET
Basophils Relative: 0 % (ref 0–1)
Eosinophils Relative: 0 % (ref 0–5)
HCT: 35.9 % — ABNORMAL LOW (ref 36.0–46.0)
Hemoglobin: 12.3 g/dL (ref 12.0–15.0)
Lymphocytes Relative: 9 % — ABNORMAL LOW (ref 12–46)
Lymphs Abs: 1 10*3/uL (ref 0.7–4.0)
MCV: 77.9 fL — ABNORMAL LOW (ref 78.0–100.0)
Monocytes Relative: 3 % (ref 3–12)
Neutro Abs: 10.2 10*3/uL — ABNORMAL HIGH (ref 1.7–7.7)
RDW: 21.2 % — ABNORMAL HIGH (ref 11.5–15.5)
WBC: 11.5 10*3/uL — ABNORMAL HIGH (ref 4.0–10.5)

## 2012-03-11 LAB — GLUCOSE, CAPILLARY: Glucose-Capillary: 227 mg/dL — ABNORMAL HIGH (ref 70–99)

## 2012-03-11 MED ORDER — INSULIN ASPART 100 UNIT/ML ~~LOC~~ SOLN: 12.0000 [IU] | Freq: Three times a day (TID) | SUBCUTANEOUS | Status: DC

## 2012-03-11 MED ORDER — SODIUM CHLORIDE 0.9 % IV SOLN: Freq: Once | INTRAVENOUS | Status: DC

## 2012-03-11 MED ORDER — SODIUM CHLORIDE 0.9 % IV SOLN: INTRAVENOUS | Status: DC

## 2012-03-11 MED ORDER — SODIUM CHLORIDE 0.9 % IV BOLUS (SEPSIS): 500.0000 mL | Freq: Once | INTRAVENOUS | Status: DC

## 2012-03-11 NOTE — ED Provider Notes (Signed)
History     CSN: [id number]  Arrival date & time 03/11/12  0430   First MD Initiated Contact with Patient 03/11/12 0446      No chief complaint on file.   (Consider location/radiation/quality/duration/timing/severity/associated sxs/prior treatment) HPI Comments: Melissa Braun is a 36 y.o. Female who is here for altered mental status. She presents by EMS. Her CBG was in the 30s, and she was treated with D50, IV.  Apparently, a family member found her altered. The patient recalls feeling dizzy, and cold. She ate her usual diet during the day yesterday. She had her insulin dosage increased 2 days ago. She has no recent illnesses, including cough, chest pain, dysuria, urinary frequency, vomiting, or diarrhea. She stopped eating a time snack several weeks ago. There are no other modifying factors.  The history is provided by the patient.    Past Medical History  Diagnosis Date  . Diabetes mellitus AGE 1    DR Talmage Nap  . Infertility   . Hypertension   . Acne   . LGSIL (low grade squamous intraepithelial dysplasia) 2008  . Sickle cell trait   . Heart murmur     Past Surgical History  Procedure Date  . Exploratory laparoscopy     DUE TO RLQ PAIN  . Gastric bypass nov. 2011    cary Bentley    Family History  Problem Relation Age of Onset  . Diabetes Father   . Hypertension Father   . Cancer Father     prostate and mouth  . Sickle cell anemia Mother   . Anemia Mother   . Sickle cell trait Sister   . Hypertension Sister     History  Substance Use Topics  . Smoking status: Never Smoker   . Smokeless tobacco: Never Used  . Alcohol Use: No    OB History    Grav Para Term Preterm Abortions TAB SAB Ect Mult Living   1               Review of Systems  All other systems reviewed and are negative.    Allergies  Bactrim; Doxycycline; and Levofloxacin  Home Medications   Current Outpatient Rx  Name Route Sig Dispense Refill  . ACCU-CHEK FASTCLIX LANCETS MISC  Percutaneous 1 Units by Percutaneous route 4 (four) times daily. 100 each 12    Dx: 648.83  . AZELAIC ACID 15 % EX GEL Topical Apply topically 2 (two) times daily. After skin is thoroughly washed and patted dry, gently but thoroughly massage a thin film of azelaic acid cream into the affected area twice daily, in the morning and evening.    Marland Kitchen GLUCOSE BLOOD VI STRP  Check blood sugars 4x/daily 100 each 12    Dx code:  409.81  . INSULIN ISOPHANE HUMAN 100 UNIT/ML [place] SUSP Subcutaneous Inject 10 Units into the skin 2 (two) times daily.    Marland Kitchen PRENATAL VITAMINS PO Oral Take 1 tablet by mouth daily.    Marland Kitchen PSEUDOEPHEDRINE-ACETAMINOPHEN 30-500 MG PO TABS Oral Take 1 tablet by mouth every 4 (four) hours as needed. For sinus pain    . INSULIN ASPART 100 UNIT/ML [place] SOLN Subcutaneous Inject 12 Units into the skin 3 (three) times daily before meals. 1 vial 0    BP 112/59  Pulse 88  Resp 18  SpO2 100%  LMP 12/11/2011  Physical Exam  Nursing note and vitals reviewed. Constitutional: She is oriented to person, place, and time. She appears well-developed and well-nourished.  HENT:  Head: Normocephalic and atraumatic.  Eyes: Conjunctivae normal and EOM are normal. Pupils are equal, round, and reactive to light.  Neck: Normal range of motion and phonation normal. Neck supple.  Cardiovascular: Normal rate, regular rhythm and intact distal pulses.   Pulmonary/Chest: Effort normal and breath sounds normal. She exhibits no tenderness.  Abdominal: Soft. She exhibits no distension. There is no tenderness. There is no guarding.  Musculoskeletal: Normal range of motion.  Neurological: She is alert and oriented to person, place, and time. She has normal strength. She exhibits normal muscle tone.  Skin: Skin is warm and dry.  Psychiatric: She has a normal mood and affect. Her behavior is normal. Judgment and thought content normal.    ED Course  Procedures (including critical care time)  CBG in the ED,  123. She's given oral food and liquid  07:45- I. discussed case with Dr. Despina Hidden, he recommends, reverting to her prior insulin dosing with meals and encourage a bedtime snack. Regular followup will be maintained.  Labs Reviewed  GLUCOSE, CAPILLARY - Abnormal; Notable for the following:    Glucose-Capillary 123 (*)     All other components within normal limits  CBC WITH DIFFERENTIAL - Abnormal; Notable for the following:    WBC 11.5 (*)     HCT 35.9 (*)     MCV 77.9 (*)     RDW 21.2 (*)     Platelets 144 (*)  PLATELET COUNT CONFIRMED BY SMEAR   Neutrophils Relative 88 (*)     Lymphocytes Relative 9 (*)     Neutro Abs 10.2 (*)     All other components within normal limits  BASIC METABOLIC PANEL - Abnormal; Notable for the following:    Potassium 3.2 (*)     Glucose, Bld 119 (*)     Creatinine, Ser 0.31 (*)     All other components within normal limits  URINALYSIS, ROUTINE W REFLEX MICROSCOPIC - Abnormal; Notable for the following:    Glucose, UA >1000 (*)     Ketones, ur 15 (*)     All other components within normal limits  GLUCOSE, CAPILLARY - Abnormal; Notable for the following:    Glucose-Capillary 227 (*)     All other components within normal limits  URINE MICROSCOPIC-ADD ON - Abnormal; Notable for the following:    Squamous Epithelial / LPF FEW (*)     All other components within normal limits  URINE CULTURE   US Fetal Nuchal Translucency Measurement  03/09/2012  OBSTETRICAL ULTRASOUND: This exam was performed within a [place]. The OB US report was generated in the [facility] system, and faxed to the ordering physician.   This report is also available in [employer] and in the [employer]. See [facility] Obstetric US report.     1. Hypoglycemia       MDM  Hypoglycemia, associated with recent increase in insulin dosing. No clinical or laboratory findings for infection. Doubt metabolic instability, serious bacterial infection or  impending vascular collapse; the patient is stable for discharge.    Plan: Home Medications- reduce mealtime Novolog to 12 units; Home Treatments- Eat bedtime snack; Recommended follow up- OB as scheduled in 4 days        Flint Melter, MD 03/11/12 [phone]

## 2012-03-11 NOTE — ED Notes (Signed)
Pt st's she's [redacted] weeks pregnant, not 16 as she previously stated.

## 2012-03-11 NOTE — ED Notes (Addendum)
Per EMS:  Family called EMS because they couldn't wake pt up.  Pt has hx of diabetes, pt was cool and diaphoretic upon EMS' arrival, initial cbg was 53.  Pt received full amp of D50, repeat CBG was 121.  Pt is fully alert x4 now.  Pt is [redacted] weeks pregnant.

## 2012-03-11 NOTE — ED Notes (Signed)
Pt made aware that we need urine sample.  

## 2012-03-12 LAB — URINE CULTURE: Colony Count: NO GROWTH

## 2012-03-15 ENCOUNTER — Other Ambulatory Visit: Payer: Self-pay | Admitting: Obstetrics & Gynecology

## 2012-03-15 ENCOUNTER — Ambulatory Visit (INDEPENDENT_AMBULATORY_CARE_PROVIDER_SITE_OTHER): Payer: 59 | Admitting: Obstetrics & Gynecology

## 2012-03-15 VITALS — BP 134/78 | Temp 98.0°F | Wt 184.2 lb

## 2012-03-15 DIAGNOSIS — O99840 Bariatric surgery status complicating pregnancy, unspecified trimester: Secondary | ICD-10-CM

## 2012-03-15 DIAGNOSIS — IMO0002 Reserved for concepts with insufficient information to code with codable children: Secondary | ICD-10-CM

## 2012-03-15 LAB — POCT URINALYSIS DIP (DEVICE)
Hgb urine dipstick: NEGATIVE
Ketones, ur: NEGATIVE mg/dL
Protein, ur: NEGATIVE mg/dL
Specific Gravity, Urine: 1.015 (ref 1.005–1.030)

## 2012-03-15 NOTE — Progress Notes (Signed)
Pulse- 94 

## 2012-03-15 NOTE — Progress Notes (Signed)
Pt had a low 35 at 4 am one morning.  Has been having lows in the morning frequently.  Will change NPH at night from 10 units qhs to 9 units qhs.  Rest of CBGs are nml per patient (patient did not bring CBG log)  Pt is going to call Maggie next week with CBGs.  Waiting on [facility] results.  Ordered ferritin, RBC folate, calcium, Vit D 25 OHD, and Vit B 12 through [facility] for history of gastric bypass.

## 2012-03-19 ENCOUNTER — Telehealth (HOSPITAL_COMMUNITY): Payer: Self-pay

## 2012-03-19 NOTE — Telephone Encounter (Signed)
Called Flora Parks to discuss her [facility], cell free fetal DNA testing. Testing was offered because of a maternal age of 52. We reviewed that these are within normal limits, showing a less than 1 in 10,000 risk for trisomies 21, 18 and 13. We reviewed that this testing identifies > 99% of pregnancies with trisomy 21, >98% of pregnancies with trisomy 35, and >80% with trisomy 5; the false positive rate is <0.1% for all conditions. Testing was also performed for X and Y chromosome analysis. This did not show evidence of aneuploidy for X or Y. It was also consistent with female gender. X and Y analysis has a detection rate of approximately 99%. She understands that this testing does not identify all genetic conditions. All questions were answered to her satisfaction, she was encouraged to call with additional questions or concerns.  Despina Arias, MS Certified Genetic Counselor

## 2012-03-22 ENCOUNTER — Telehealth: Payer: Self-pay | Admitting: Dietician

## 2012-03-22 ENCOUNTER — Telehealth: Payer: Self-pay | Admitting: *Deleted

## 2012-03-22 NOTE — Telephone Encounter (Signed)
Diabetes Education per Phone Call on 03/22/2012:  Received fax from Diane Day in [facility].  Carollee Herter had faxed her numbers for the last week into to the clinic for me to review. Fasting levels: 77-89 mg/dl 2 hr past BK: 161-096 (3 were greater than 120) 2 hr past Lunch: 155-173 mg/dl (all were greater than 155) 2 hr past Dinner: 112-150 mg/dl. (4 of the 7 were greater than 120)  Review of her meals reveals that breakfast is more than 30 gm of CHO and has more than needed fat.  She is to try an [place] muffin rather than a biscuit for this meal and omit any fried foods.  Snacks: She is not always eating a snack in the AM, Afternoon, or Evening.  Encouraged her to get her snack and aim for less carb at meal time.  Lunch:  Appears to be greater than 50 gm of CHO at lunch.  Encouraged to eat less carb and have a mid-afternoon snack.  Dinner:  Aim for less fried foods and plan for 45-50 gm of CHO at dinner and a bedtime snack.  Keep a diary for this next week and plan to bring the levels to clinic or to call/fax them in as she did this week.  Dorothy Puffer, CDE

## 2012-03-22 NOTE — Telephone Encounter (Addendum)
Pt left message stating that she is calling with her CBG results from the past week. She was unable to call earlier today to speak w/Maggie May per Dr. Bertram Denver request.  I called pt and asked her to fax the information to me and I will forward to [facility].  Pt agreed. Fax received and forwarded on to [facility] @ [facility].  Seward Grater was notified to expect the fax and to call pt directly with any change in plan of care recommendations.

## 2012-03-24 LAB — CALCIUM: Calcium: 8.9 mg/dL (ref 8.7–10.2)

## 2012-03-24 LAB — FOLATE RBC
Folate, RBC: 1003 ng/mL (ref 499–1504)
HCT: 34.7 % (ref 34.0–46.6)

## 2012-03-24 LAB — VITAMIN B12: Vitamin B-12: 245 pg/mL (ref 211–946)

## 2012-03-24 LAB — FERRITIN: Ferritin: 14 ng/mL — ABNORMAL LOW (ref 15–150)

## 2012-04-05 ENCOUNTER — Ambulatory Visit (INDEPENDENT_AMBULATORY_CARE_PROVIDER_SITE_OTHER): Payer: 59 | Admitting: Obstetrics & Gynecology

## 2012-04-05 ENCOUNTER — Encounter: Payer: 59 | Attending: Family Medicine | Admitting: Dietician

## 2012-04-05 VITALS — BP 146/83 | Temp 98.2°F | Wt 186.0 lb

## 2012-04-05 DIAGNOSIS — Z713 Dietary counseling and surveillance: Secondary | ICD-10-CM | POA: Insufficient documentation

## 2012-04-05 DIAGNOSIS — O24919 Unspecified diabetes mellitus in pregnancy, unspecified trimester: Secondary | ICD-10-CM

## 2012-04-05 DIAGNOSIS — O0990 Supervision of high risk pregnancy, unspecified, unspecified trimester: Secondary | ICD-10-CM

## 2012-04-05 DIAGNOSIS — O99810 Abnormal glucose complicating pregnancy: Secondary | ICD-10-CM | POA: Insufficient documentation

## 2012-04-05 LAB — POCT URINALYSIS DIP (DEVICE)
Bilirubin Urine: NEGATIVE
Glucose, UA: NEGATIVE mg/dL
Hgb urine dipstick: NEGATIVE
Specific Gravity, Urine: 1.02 (ref 1.005–1.030)
Urobilinogen, UA: 1 mg/dL (ref 0.0–1.0)
pH: 7 (ref 5.0–8.0)

## 2012-04-05 NOTE — Progress Notes (Signed)
Pulse 92 2ndBP: 138/87 Edema trace in ankles and feet.

## 2012-04-05 NOTE — Progress Notes (Signed)
Diabetes/Nutritional F/U:  Relates recent episode of low blood glucose.  On that day, had 25-30 gm CHO breakfast and about 9:45-10:15, had an episode of the nervous, sweaty symptoms with a blood glucose drop to 55 mg. Treated with "a piece of candy and about 4-6 oz of regular soda and relates it went too high."  Does not have glucose log today.  Completed review of treatment of hypoglycemia and interventions to try and the process of using either the candy or the soda.  Maggie Lexington Krotz, RN, RD, CDE

## 2012-04-05 NOTE — Patient Instructions (Signed)
Gestational Diabetes Mellitus Gestational diabetes mellitus (GDM) is diabetes that occurs only during pregnancy. This happens when the body cannot properly handle the glucose (sugar) that increases in the blood after eating. During pregnancy, insulin resistance (reduced sensitivity to insulin) occurs because of the release of hormones from the placenta. Usually, the pancreas of pregnant women produces enough insulin to overcome the resistance that occurs. However, in gestational diabetes, the insulin is there but it does not work effectively. If the resistance is severe enough that the pancreas does not produce enough insulin, extra glucose builds up in the blood.  WHO IS AT RISK FOR DEVELOPING GESTATIONAL DIABETES?  Women with a history of diabetes in the family.  Women over age 25.  Women who are overweight.  Women in certain ethnic groups (Hispanic, African American, Native American, Asian and Pacific Islander). WHAT CAN HAPPEN TO THE BABY? If the mother's blood glucose is too high while she is pregnant, the extra sugar will travel through the umbilical cord to the baby. Some of the problems the baby may have are:  Large Baby - If the baby receives too much sugar, the baby will gain more weight. This may cause the baby to be too large to be born normally (vaginally) and a Cesarean section (C-section) may be needed.  Low Blood Glucose (hypoglycemia)  The baby makes extra insulin, in response to the extra sugar its gets from its mother. When the baby is born and no longer needs this extra insulin, the baby's blood glucose level may drop.  Jaundice (yellow coloring of the skin and eyes)  This is fairly common in babies. It is caused from a build-up of the chemical called bilirubin. This is rarely serious, but is seen more often in babies whose mothers had gestational diabetes. RISKS TO THE MOTHER Women who have had gestational diabetes may be at higher risk for some problems,  including:  Preeclampsia or toxemia, which includes problems with high blood pressure. Blood pressure and protein levels in the urine must be checked frequently.  Infections.  Cesarean section (C-section) for delivery.  Developing Type 2 diabetes later in life. About 30-50% will develop diabetes later, especially if obese. DIAGNOSIS  The hormones that cause insulin resistance are highest at about 24-28 weeks of pregnancy. If symptoms are experienced, they are much like symptoms you would normally expect during pregnancy.  GDM is often diagnosed using a two part method: 1. After 24-28 weeks of pregnancy, the woman drinks a glucose solution and takes a blood test. If the glucose level is high, a second test will be given. 2. Oral Glucose Tolerance Test (OGTT) which is 3 hours long  After not eating overnight, the blood glucose is checked. The woman drinks a glucose solution, and hourly blood glucose tests are taken. If the woman has risk factors for GDM, the caregiver may test earlier than 24 weeks of pregnancy. TREATMENT  Treatment of GDM is directed at keeping the mother's blood glucose level normal, and may include:  Meal planning.  Taking insulin or other medicine to control your blood glucose level.  Exercise.  Keeping a daily record of the foods you eat.  Blood glucose monitoring and keeping a record of your blood glucose levels.  May monitor ketone levels in the urine, although this is no longer considered necessary in most pregnancies. HOME CARE INSTRUCTIONS  While you are pregnant:  Follow your caregiver's advice regarding your prenatal appointments, meal planning, exercise, medicines, vitamins, blood and other tests, and physical   activities.  Keep a record of your meals, blood glucose tests, and the amount of insulin you are taking (if any). Show this to your caregiver at every prenatal visit.  If you have GDM, you may have problems with hypoglycemia (low blood glucose).  You may suspect this if you become suddenly dizzy, feel shaky, and/or weak. If you think this is happening and you have a glucose meter, try to test your blood glucose level. Follow your caregiver's advice for when and how to treat your low blood glucose. Generally, the 15:15 rule is followed: Treat by consuming 15 grams of carbohydrates, wait 15 minutes, and recheck blood glucose. Examples of 15 grams of carbohydrates are:  1 cup skim or low-fat milk.   cup juice.  3-4 glucose tablets.  5-6 hard candies.  1 small box raisins.   cup regular soda pop.  Practice good hygiene, to avoid infections.  Do not smoke. SEEK MEDICAL CARE IF:   You develop abnormal vaginal discharge, with or without itching.  You become weak and tired more than expected.  You seem to sweat a lot.  You have a sudden increase in weight, 5 pounds or more in one week.  You are losing weight, 3 pounds or more in a week.  Your blood glucose level is high, and you need instructions on what to do about it. SEEK IMMEDIATE MEDICAL CARE IF:   You develop a severe headache.  You faint or pass out.  You develop nausea and vomiting.  You become disoriented or confused.  You have a convulsion.  You develop vision problems.  You develop stomach pain.  You develop vaginal bleeding.  You develop uterine contractions.  You have leaking or a gush of fluid from the vagina. AFTER YOU HAVE THE BABY:  Go to all of your follow-up appointments, and have blood tests as advised by your caregiver.  Maintain a healthy lifestyle, to prevent diabetes in the future. This includes:  Following a healthy meal plan.  Controlling your weight.  Getting enough exercise and proper rest.  Do not smoke.  Breastfeed your baby if you can. This will lower the chance of you and your baby developing diabetes later in life. For more information about diabetes, go to the American Diabetes Association at:  www.americandiabetesassociation.org. For more information about gestational diabetes, go to the American Congress of Obstetricians and Gynecologists at: www.acog.org. Document Released: 08/18/2000 Document Revised: 08/04/2011 Document Reviewed: 03/12/2009 ExitCare Patient Information 2013 [employer].  

## 2012-04-05 NOTE — Progress Notes (Signed)
States BG only rarely low, may spike to 220. Does not have her book today. Will meet with nutritionist. Has [place] scheduled

## 2012-04-13 DIAGNOSIS — O24919 Unspecified diabetes mellitus in pregnancy, unspecified trimester: Secondary | ICD-10-CM

## 2012-04-13 DIAGNOSIS — O10019 Pre-existing essential hypertension complicating pregnancy, unspecified trimester: Secondary | ICD-10-CM

## 2012-04-13 DIAGNOSIS — O09529 Supervision of elderly multigravida, unspecified trimester: Secondary | ICD-10-CM

## 2012-04-13 DIAGNOSIS — O0990 Supervision of high risk pregnancy, unspecified, unspecified trimester: Secondary | ICD-10-CM

## 2012-04-13 DIAGNOSIS — IMO0002 Reserved for concepts with insufficient information to code with codable children: Secondary | ICD-10-CM

## 2012-04-16 ENCOUNTER — Ambulatory Visit (HOSPITAL_COMMUNITY)
Admission: RE | Admit: 2012-04-16 | Discharge: 2012-04-16 | Disposition: A | Discharge status: Home / Self Care | Payer: 59 | Source: Ambulatory Visit | Attending: Obstetrics & Gynecology | Admitting: Obstetrics & Gynecology

## 2012-04-16 VITALS — BP 141/80 | HR 95 | Wt 189.0 lb

## 2012-04-16 DIAGNOSIS — O0990 Supervision of high risk pregnancy, unspecified, unspecified trimester: Secondary | ICD-10-CM

## 2012-04-16 DIAGNOSIS — O358XX0 Maternal care for other (suspected) fetal abnormality and damage, not applicable or unspecified: Secondary | ICD-10-CM | POA: Insufficient documentation

## 2012-04-16 DIAGNOSIS — O24919 Unspecified diabetes mellitus in pregnancy, unspecified trimester: Secondary | ICD-10-CM | POA: Insufficient documentation

## 2012-04-16 DIAGNOSIS — O10019 Pre-existing essential hypertension complicating pregnancy, unspecified trimester: Secondary | ICD-10-CM

## 2012-04-16 DIAGNOSIS — Z3682 Encounter for antenatal screening for nuchal translucency: Secondary | ICD-10-CM

## 2012-04-16 DIAGNOSIS — Z363 Encounter for antenatal screening for malformations: Secondary | ICD-10-CM | POA: Insufficient documentation

## 2012-04-16 DIAGNOSIS — IMO0002 Reserved for concepts with insufficient information to code with codable children: Secondary | ICD-10-CM

## 2012-04-16 DIAGNOSIS — E669 Obesity, unspecified: Secondary | ICD-10-CM

## 2012-04-16 DIAGNOSIS — Z1389 Encounter for screening for other disorder: Secondary | ICD-10-CM | POA: Insufficient documentation

## 2012-04-16 DIAGNOSIS — I10 Essential (primary) hypertension: Secondary | ICD-10-CM

## 2012-04-16 DIAGNOSIS — O09529 Supervision of elderly multigravida, unspecified trimester: Secondary | ICD-10-CM | POA: Insufficient documentation

## 2012-04-16 DIAGNOSIS — E139 Other specified diabetes mellitus without complications: Secondary | ICD-10-CM

## 2012-04-16 NOTE — Progress Notes (Signed)
Melissa Braun  was seen today for an ultrasound appointment.  See full report in [facility].  Comments: Ms. Steinmeyer is seen today due to history of pregestational diabetes and advanced maternal age.  She is currently scheduled for fetal echo.  Cell free fetal DNA ([facility] test) was low risk for aneuploidy.  Impression: Single IUP at 18 1/7 weeks Normal detailed fetal anatomy No markers associated with aneuploidy noted Normal amniotic fluid volume  Plan: Recommend follow up ultrasound in 6 weeks.   Alpha Gula, MD

## 2012-04-25 ENCOUNTER — Other Ambulatory Visit: Payer: Self-pay | Admitting: Obstetrics & Gynecology

## 2012-04-25 DIAGNOSIS — E569 Vitamin deficiency, unspecified: Secondary | ICD-10-CM

## 2012-04-25 MED ORDER — FERROUS SULFATE 325 (65 FE) MG PO TABS: 325.0000 mg | ORAL_TABLET | Freq: Every day | ORAL | Status: DC

## 2012-04-25 MED ORDER — VITAMIN D3 50 MCG (2000 UT) PO CAPS: 2000.0000 [IU] | ORAL_CAPSULE | Freq: Every day | ORAL | Status: DC

## 2012-04-25 NOTE — Progress Notes (Signed)
Patient has low vit D level and low ferritin.  Patient was prescribed Vit D3 2000 units daily and ferrous sulfate.  RN to call patient to inform her about her medications.  Recheck Vit D level in one month.

## 2012-04-26 ENCOUNTER — Ambulatory Visit (INDEPENDENT_AMBULATORY_CARE_PROVIDER_SITE_OTHER): Payer: 59 | Admitting: Obstetrics & Gynecology

## 2012-04-26 VITALS — BP 140/82 | Temp 98.2°F | Wt 187.9 lb

## 2012-04-26 DIAGNOSIS — O24919 Unspecified diabetes mellitus in pregnancy, unspecified trimester: Secondary | ICD-10-CM

## 2012-04-26 DIAGNOSIS — O0990 Supervision of high risk pregnancy, unspecified, unspecified trimester: Secondary | ICD-10-CM

## 2012-04-26 LAB — POCT URINALYSIS DIP (DEVICE)
Hgb urine dipstick: NEGATIVE
Protein, ur: NEGATIVE mg/dL
Specific Gravity, Urine: 1.015 (ref 1.005–1.030)
Urobilinogen, UA: 1 mg/dL (ref 0.0–1.0)
pH: 6.5 (ref 5.0–8.0)

## 2012-04-26 MED ORDER — INSULIN ASPART 100 UNIT/ML ~~LOC~~ SOLN: SUBCUTANEOUS | Status: DC

## 2012-04-26 NOTE — Progress Notes (Signed)
Patient forgot her log book at home, reports fastings in 70-90s; 2 hr PP B  And L less than 120, dinner in 180s-200s.  Advised to cut down carb intake at dinner; also to increase Novolin at dinner ti 15 units.  New regimen; Novolog 05/06/14 and Novolin 10/10.  Random CBG 81. Scheduled for fetal ECHO soon, will schedule Optho appointment.  OB precautions advised.

## 2012-04-26 NOTE — Patient Instructions (Signed)
Increase Novolog regimen to 05/06/14;  Keep the Novolin the same  Remember to bring blood sugar log next time

## 2012-04-26 NOTE — Progress Notes (Signed)
Pulse: 92

## 2012-04-26 NOTE — Progress Notes (Signed)
Pt informed

## 2012-04-27 ENCOUNTER — Other Ambulatory Visit: Payer: Self-pay | Admitting: Obstetrics & Gynecology

## 2012-04-27 MED ORDER — VITAMIN D3 75 MCG (3000 UT) PO TABS: 1.0000 | ORAL_TABLET | Freq: Every day | ORAL | Status: DC

## 2012-04-27 MED ORDER — FERROUS SULFATE 325 (65 FE) MG PO TABS: 325.0000 mg | ORAL_TABLET | Freq: Every day | ORAL | Status: DC

## 2012-05-03 ENCOUNTER — Ambulatory Visit (HOSPITAL_COMMUNITY)
Admission: RE | Admit: 2012-05-03 | Discharge: 2012-05-03 | Disposition: A | Discharge status: Home / Self Care | Payer: 59 | Source: Ambulatory Visit | Attending: Obstetrics & Gynecology | Admitting: Obstetrics & Gynecology

## 2012-05-03 ENCOUNTER — Other Ambulatory Visit (HOSPITAL_COMMUNITY): Payer: Self-pay | Admitting: Maternal and Fetal Medicine

## 2012-05-03 DIAGNOSIS — IMO0002 Reserved for concepts with insufficient information to code with codable children: Secondary | ICD-10-CM

## 2012-05-03 DIAGNOSIS — E669 Obesity, unspecified: Secondary | ICD-10-CM

## 2012-05-03 DIAGNOSIS — I10 Essential (primary) hypertension: Secondary | ICD-10-CM

## 2012-05-03 DIAGNOSIS — E139 Other specified diabetes mellitus without complications: Secondary | ICD-10-CM

## 2012-05-03 DIAGNOSIS — O0990 Supervision of high risk pregnancy, unspecified, unspecified trimester: Secondary | ICD-10-CM

## 2012-05-03 DIAGNOSIS — Z3682 Encounter for antenatal screening for nuchal translucency: Secondary | ICD-10-CM

## 2012-05-03 DIAGNOSIS — O24919 Unspecified diabetes mellitus in pregnancy, unspecified trimester: Secondary | ICD-10-CM

## 2012-05-03 DIAGNOSIS — O10019 Pre-existing essential hypertension complicating pregnancy, unspecified trimester: Secondary | ICD-10-CM

## 2012-05-03 DIAGNOSIS — O09529 Supervision of elderly multigravida, unspecified trimester: Secondary | ICD-10-CM

## 2012-05-03 NOTE — Consult Note (Signed)
Dear  Colleagues,  I had the pleasure of seeing Melissa Braun for consultation in the Fetal Heart Program at [facility]. As you know, she is a 36 y.o. G1P0 currently at 20 weeks 5 days gestation referred for fetal cardiovascular evaluation secondary to maternal diabetes. This pregnancy was conceived naturally. There has not been any indication for amniocentesis. She has not experienced any significant intercurrent illnesses or hospitalizations. She is on prenatal vitamins and insulin. Her sugar control has been adequate by report with a HgA1C of 7.9% reported from her first trimester.  She denies any exposure to tobacco, alcohol or recreational drugs. She denies any complaints today.  All systems reviewed and negative other than listed in HPI.  There is no family history of congenital heart disease, birth defects, genetic abnormalities, arrhythmia, pacemakers or sudden cardiac death.    A complete Fetal Echocardiogram with Doppler assessment was performed in our clinic today, which I personally interpreted and reviewed with Melissa Braun and her husband.  Please refer to a copy of my final report in [facility] if you would like to review further details.  We did not identify any significant intracardiac abnormalities.  There was normal Doppler patterns in the umbilical vessels and middle cerebral artery. There was a normal fetal heart rate and rhythm throughout today's study without ectopy.  In summary, I felt that this Fetal Echo was completely normal. These findings were discussed with Melissa Braun and her husband.  I also told them that fetal echocardiography may not be able to detect minor abnormalities such as small ventricular septal defects, subtle valve abnormalities or mild coarctation of the aorta. Nor can it predict postnatal persistence of normal fetal structures such as a patent foramen ovale or patent ductus arteriosus.  I do not feel that Melissa Braun requires a  follow-up Fetal Echo appointment in our [facility]etal [facility] here at [facility].  But I am glad to see her back if you have any persistent concerns or if new clinical problems arise. I also do not feel that an echocardiogram is necessary after delivery unless indicated by clinical conditions. Please do not hesitate to contact me with any questions or concerns  Thank you for involving me in the care of your patient today.   Sincerely,  Massie Bougie, MD Pediatric Cardiology [facility]  Total Time: 30 minues. Time of Counseling/Coordinating care: 20 minutes.

## 2012-05-10 ENCOUNTER — Encounter: Payer: 59 | Attending: Family Medicine | Admitting: Dietician

## 2012-05-10 ENCOUNTER — Ambulatory Visit (INDEPENDENT_AMBULATORY_CARE_PROVIDER_SITE_OTHER): Payer: 59 | Admitting: Obstetrics & Gynecology

## 2012-05-10 VITALS — BP 144/83 | Temp 97.7°F | Wt 188.5 lb

## 2012-05-10 DIAGNOSIS — O99810 Abnormal glucose complicating pregnancy: Secondary | ICD-10-CM | POA: Insufficient documentation

## 2012-05-10 DIAGNOSIS — O0990 Supervision of high risk pregnancy, unspecified, unspecified trimester: Secondary | ICD-10-CM

## 2012-05-10 DIAGNOSIS — O24919 Unspecified diabetes mellitus in pregnancy, unspecified trimester: Secondary | ICD-10-CM

## 2012-05-10 DIAGNOSIS — Z713 Dietary counseling and surveillance: Secondary | ICD-10-CM | POA: Insufficient documentation

## 2012-05-10 LAB — POCT URINALYSIS DIP (DEVICE)
Bilirubin Urine: NEGATIVE
Glucose, UA: NEGATIVE mg/dL
Hgb urine dipstick: NEGATIVE
Ketones, ur: NEGATIVE mg/dL
Nitrite: NEGATIVE
Protein, ur: NEGATIVE mg/dL
Specific Gravity, Urine: 1.015 (ref 1.005–1.030)
Urobilinogen, UA: 1 mg/dL (ref 0.0–1.0)
pH: 7 (ref 5.0–8.0)

## 2012-05-10 NOTE — Progress Notes (Signed)
Blood sugars ok, FBS<90, pp almost all<140. Had normal fetal echo 05/03/12

## 2012-05-10 NOTE — Progress Notes (Signed)
Diabetes Education:  Reports that her insurance plan is not covering the strips for Accu-Chek meter that I provided. Reports that on the past her insurance has covered what she determines is a Life Scan One Touch Ultra product.  Provided her with a One Touch Ultra Two meter.  Lot: [id number] X Exp: 08/2012.  Strips will be comparable with the One Touch Ultra Mini which says she was using at home.  Has question regarding her lancing device and the lancets it will need.  She is to check her meter and to call the clinic nurse on 05/11/2012 with the meter name for changing the prescription for strips and lancets in [facility] and at the drugstore.  Maggie Zaniah Titterington, RN, RD, CDE

## 2012-05-10 NOTE — Patient Instructions (Signed)
Breastfeeding Deciding to breastfeed is one of the best choices you can make for you and your baby. The information that follows gives a brief overview of the benefits of breastfeeding as well as common topics surrounding breastfeeding. BENEFITS OF BREASTFEEDING For the baby  The first milk (colostrum) helps the baby's digestive system function better.   There are antibodies in the mother's milk that help the baby fight off infections.   The baby has a lower incidence of asthma, allergies, and sudden infant death syndrome (SIDS).   The nutrients in breast milk are better for the baby than infant formulas, and breast milk helps the baby's brain grow better.   Babies who breastfeed have less gas, colic, and constipation.  For the mother  Breastfeeding helps develop a very special bond between the mother and her baby.   Breastfeeding is convenient, always available at the correct temperature, and costs nothing.   Breastfeeding burns calories in the mother and helps her lose weight that was gained during pregnancy.   Breastfeeding makes the uterus contract back down to normal size faster and slows bleeding following delivery.   Breastfeeding mothers have a lower risk of developing breast cancer.  BREASTFEEDING FREQUENCY  A healthy, full-term baby may breastfeed as often as every hour or space his or her feedings to every 3 hours.   Watch your baby for signs of hunger. Nurse your baby if he or she shows signs of hunger. How often you nurse will vary from baby to baby.   Nurse as often as the baby requests, or when you feel the need to reduce the fullness of your breasts.   Awaken the baby if it has been 3 4 hours since the last feeding.   Frequent feeding will help the mother make more milk and will help prevent problems, such as sore nipples and engorgement of the breasts.  BABY'S POSITION AT THE BREAST  Whether lying down or sitting, be sure that the baby's tummy is  facing your tummy.   Support the breast with 4 fingers underneath the breast and the thumb above. Make sure your fingers are well away from the nipple and baby's mouth.   Stroke the baby's lips gently with your finger or nipple.   When the baby's mouth is open wide enough, place all of your nipple and as much of the areola as possible into your baby's mouth.   Pull the baby in close so the tip of the nose and the baby's cheeks touch the breast during the feeding.  FEEDINGS AND SUCTION  The length of each feeding varies from baby to baby and from feeding to feeding.   The baby must suck about 2 3 minutes for your milk to get to him or her. This is called a "let down." For this reason, allow the baby to feed on each breast as long as he or she wants. Your baby will end the feeding when he or she has received the right balance of nutrients.   To break the suction, put your finger into the corner of the baby's mouth and slide it between his or her gums before removing your breast from his or her mouth. This will help prevent sore nipples.  HOW TO TELL WHETHER YOUR BABY IS GETTING ENOUGH BREAST MILK. Wondering whether or not your baby is getting enough milk is a common concern among mothers. You can be assured that your baby is getting enough milk if:   Your baby is actively   sucking and you hear swallowing.   Your baby seems relaxed and satisfied after a feeding.   Your baby nurses at least 8 12 times in a 24 hour time period. Nurse your baby until he or she unlatches or falls asleep at the first breast (at least 10 20 minutes), then offer the second side.   Your baby is wetting 5 6 disposable diapers (6 8 cloth diapers) in a 24 hour period by 5 6 days of age.   Your baby is having at least 3 4 stools every 24 hours for the first 6 weeks. The stool should be soft and yellow.   Your baby should gain 4 7 ounces per week after he or she is 4 days old.   Your breasts feel softer  after nursing.  REDUCING BREAST ENGORGEMENT  In the first week after your baby is born, you may experience signs of breast engorgement. When breasts are engorged, they feel heavy, warm, full, and may be tender to the touch. You can reduce engorgement if you:   Nurse frequently, every 2 3 hours. Mothers who breastfeed early and often have fewer problems with engorgement.   Place light ice packs on your breasts for 10 20 minutes between feedings. This reduces swelling. Wrap the ice packs in a lightweight towel to protect your skin. Bags of frozen vegetables work well for this purpose.   Take a warm shower or apply warm, moist heat to your breast for 5 10 minutes just before each feeding. This increases circulation and helps the milk flow.   Gently massage your breast before and during the feeding. Using your finger tips, massage from the chest wall towards your nipple in a circular motion.   Make sure that the baby empties at least one breast at every feeding before switching sides.   Use a breast pump to empty the breasts if your baby is sleepy or not nursing well. You may also want to pump if you are returning to work oryou feel you are getting engorged.   Avoid bottle feeds, pacifiers, or supplemental feedings of water or juice in place of breastfeeding. Breast milk is all the food your baby needs. It is not necessary for your baby to have water or formula. In fact, to help your breasts make more milk, it is best not to give your baby supplemental feedings during the early weeks.   Be sure the baby is latched on and positioned properly while breastfeeding.   Wear a supportive bra, avoiding underwire styles.   Eat a balanced diet with enough fluids.   Rest often, relax, and take your prenatal vitamins to prevent fatigue, stress, and anemia.  If you follow these suggestions, your engorgement should improve in 24 48 hours. If you are still experiencing difficulty, call your  lactation consultant or caregiver.  CARING FOR YOURSELF Take care of your breasts  Bathe or shower daily.   Avoid using soap on your nipples.   Start feedings on your left breast at one feeding and on your right breast at the next feeding.   You will notice an increase in your milk supply 2 5 days after delivery. You may feel some discomfort from engorgement, which makes your breasts very firm and often tender. Engorgement "peaks" out within 24 48 hours. In the meantime, apply warm moist towels to your breasts for 5 10 minutes before feeding. Gentle massage and expression of some milk before feeding will soften your breasts, making it easier for your   baby to latch on.   Wear a well-fitting nursing bra, and air dry your nipples for a 3 4minutes after each feeding.   Only use cotton bra pads.   Only use pure lanolin on your nipples after nursing. You do not need to wash it off before feeding the baby again. Another option is to express a few drops of breast milk and gently massage it into your nipples.  Take care of yourself  Eat well-balanced meals and nutritious snacks.   Drinking milk, fruit juice, and water to satisfy your thirst (about 8 glasses a day).   Get plenty of rest.  Avoid foods that you notice affect the baby in a bad way.  SEEK MEDICAL CARE IF:   You have difficulty with breastfeeding and need help.   You have a hard, red, sore area on your breast that is accompanied by a fever.   Your baby is too sleepy to eat well or is having trouble sleeping.   Your baby is wetting less than 6 diapers a day, by 5 days of age.   Your baby's skin or white part of his or her eyes is more yellow than it was in the hospital.   You feel depressed.  Document Released: 05/12/2005 Document Revised: 11/11/2011 Document Reviewed: 08/10/2011 ExitCare Patient Information 2013 [employer].  

## 2012-05-10 NOTE — Progress Notes (Signed)
p=[phone] 

## 2012-05-11 ENCOUNTER — Telehealth: Payer: Self-pay | Admitting: *Deleted

## 2012-05-11 NOTE — Telephone Encounter (Signed)
Pt left message stating that she is calling to report the name of her glucometer so that test strips and lancets can be ordered. She uses the One touch machine.  I called her pharmacy and gave the information to them of the name of her meter.  Pt has refills on file for test strips and lancets. I then called pt and left message on her personal voice mail that she may call her pharmacy for the refill and go to pick up. If she has any additional questions or problems, please call back with new message.

## 2012-05-13 ENCOUNTER — Telehealth: Payer: Self-pay

## 2012-05-13 DIAGNOSIS — O24919 Unspecified diabetes mellitus in pregnancy, unspecified trimester: Secondary | ICD-10-CM

## 2012-05-13 NOTE — Telephone Encounter (Signed)
Pt called and stated that she needed to have her test strips/lancets for her one touch meter and that she needed her insulins called in to [facility] @ [phone] cause now her insurance needs her to start doing it by mail.

## 2012-05-14 MED ORDER — INSULIN NPH (HUMAN) (ISOPHANE) 100 UNIT/ML ~~LOC~~ SUSP: 10.0000 [IU] | Freq: Two times a day (BID) | SUBCUTANEOUS | Status: DC

## 2012-05-14 MED ORDER — ACCU-CHEK FASTCLIX LANCETS MISC: 1.0000 [IU] | Freq: Four times a day (QID) | Status: AC

## 2012-05-14 MED ORDER — INSULIN SYRINGES (DISPOSABLE) U-100 0.5 ML MISC: Status: DC

## 2012-05-14 MED ORDER — GLUCOSE BLOOD VI STRP: ORAL_STRIP | Status: AC

## 2012-05-14 MED ORDER — INSULIN ASPART 100 UNIT/ML ~~LOC~~ SOLN: SUBCUTANEOUS | Status: DC

## 2012-05-14 NOTE — Telephone Encounter (Signed)
Spoke w/ multiple representatives from [facility] and at the recommendation of pharmacist-Ariel tried to obtain override so that pt could continue to receive her prescriptions from retail pharmacy. This was not going to be possible so I provided pt's insulin and test supply Rx information to [facility]- pharmacist.  Pt was notified to expect her meds and supplies via mail.  Pt voiced understanding.

## 2012-05-24 ENCOUNTER — Ambulatory Visit (INDEPENDENT_AMBULATORY_CARE_PROVIDER_SITE_OTHER): Payer: 59 | Admitting: Obstetrics & Gynecology

## 2012-05-24 VITALS — BP 135/77 | Wt 194.0 lb

## 2012-05-24 DIAGNOSIS — O0990 Supervision of high risk pregnancy, unspecified, unspecified trimester: Secondary | ICD-10-CM

## 2012-05-24 DIAGNOSIS — O24919 Unspecified diabetes mellitus in pregnancy, unspecified trimester: Secondary | ICD-10-CM

## 2012-05-24 LAB — POCT URINALYSIS DIP (DEVICE)
Glucose, UA: NEGATIVE mg/dL
Hgb urine dipstick: NEGATIVE
Ketones, ur: NEGATIVE mg/dL
Protein, ur: NEGATIVE mg/dL
Specific Gravity, Urine: 1.02 (ref 1.005–1.030)
Urobilinogen, UA: 0.2 mg/dL (ref 0.0–1.0)

## 2012-05-24 NOTE — Progress Notes (Signed)
Stop tylenol sinus due htn.  Pt will start zyrtec.  Pt did not bring sugar book.  Per patient--fastings are less than 90.  PP are <120.  Need vitamin D levels in January (next visit).  Not feeling fetal mvmt but can hear mvmt on doppler.

## 2012-05-24 NOTE — Progress Notes (Signed)
Pulse- 101 

## 2012-05-24 NOTE — Patient Instructions (Signed)
Contraception Choices  Contraception (birth control) is the use of any methods or devices to prevent pregnancy. Below are some methods to help avoid pregnancy.  HORMONAL METHODS   · Contraceptive implant. This is a thin, plastic tube containing progesterone hormone. It does not contain estrogen hormone. Your caregiver inserts the tube in the inner part of the upper arm. The tube can remain in place for up to 3 years. After 3 years, the implant must be removed. The implant prevents the ovaries from releasing an egg (ovulation), thickens the cervical mucus which prevents sperm from entering the uterus, and thins the lining of the inside of the uterus.  · Progesterone-only injections. These injections are given every 3 months by your caregiver to prevent pregnancy. This synthetic progesterone hormone stops the ovaries from releasing eggs. It also thickens cervical mucus and changes the uterine lining. This makes it harder for sperm to survive in the uterus.  · Birth control pills. These pills contain estrogen and progesterone hormone. They work by stopping the egg from forming in the ovary (ovulation). Birth control pills are prescribed by a caregiver. Birth control pills can also be used to treat heavy periods.  · Minipill. This type of birth control pill contains only the progesterone hormone. They are taken every day of each month and must be prescribed by your caregiver.  · Birth control patch. The patch contains hormones similar to those in birth control pills. It must be changed once a week and is prescribed by a caregiver.  · Vaginal ring. The ring contains hormones similar to those in birth control pills. It is left in the vagina for 3 weeks, removed for 1 week, and then a new one is put back in place. The patient must be comfortable inserting and removing the ring from the vagina. A caregiver's prescription is necessary.  · Emergency contraception. Emergency contraceptives prevent pregnancy after unprotected  sexual intercourse. This pill can be taken right after sex or up to 5 days after unprotected sex. It is most effective the sooner you take the pills after having sexual intercourse. Emergency contraceptive pills are available without a prescription. Check with your pharmacist. Do not use emergency contraception as your only form of birth control.  BARRIER METHODS   · Female condom. This is a thin sheath (latex or rubber) that is worn over the penis during sexual intercourse. It can be used with spermicide to increase effectiveness.  · Female condom. This is a soft, loose-fitting sheath that is put into the vagina before sexual intercourse.  · Diaphragm. This is a soft, latex, dome-shaped barrier that must be fitted by a caregiver. It is inserted into the vagina, along with a spermicidal jelly. It is inserted before intercourse. The diaphragm should be left in the vagina for 6 to 8 hours after intercourse.  · Cervical cap. This is a round, soft, latex or plastic cup that fits over the cervix and must be fitted by a caregiver. The cap can be left in place for up to 48 hours after intercourse.  · Sponge. This is a soft, circular piece of polyurethane foam. The sponge has spermicide in it. It is inserted into the vagina after wetting it and before sexual intercourse.  · Spermicides. These are chemicals that kill or block sperm from entering the cervix and uterus. They come in the form of creams, jellies, suppositories, foam, or tablets. They do not require a prescription. They are inserted into the vagina with an applicator before having sexual intercourse.   The process must be repeated every time you have sexual intercourse.  INTRAUTERINE CONTRACEPTION  · Intrauterine device (IUD). This is a T-shaped device that is put in a woman's uterus during a menstrual period to prevent pregnancy. There are 2 types:  · Copper IUD. This type of IUD is wrapped in copper wire and is placed inside the uterus. Copper makes the uterus and  fallopian tubes produce a fluid that kills sperm. It can stay in place for 10 years.  · Hormone IUD. This type of IUD contains the hormone progestin (synthetic progesterone). The hormone thickens the cervical mucus and prevents sperm from entering the uterus, and it also thins the uterine lining to prevent implantation of a fertilized egg. The hormone can weaken or kill the sperm that get into the uterus. It can stay in place for 5 years.  PERMANENT METHODS OF CONTRACEPTION  · Female tubal ligation. This is when the woman's fallopian tubes are surgically sealed, tied, or blocked to prevent the egg from traveling to the uterus.  · Female sterilization. This is when the female has the tubes that carry sperm tied off (vasectomy). This blocks sperm from entering the vagina during sexual intercourse. After the procedure, the man can still ejaculate fluid (semen).  NATURAL PLANNING METHODS  · Natural family planning. This is not having sexual intercourse or using a barrier method (condom, diaphragm, cervical cap) on days the woman could become pregnant.  · Calendar method. This is keeping track of the length of each menstrual cycle and identifying when you are fertile.  · Ovulation method. This is avoiding sexual intercourse during ovulation.  · Symptothermal method. This is avoiding sexual intercourse during ovulation, using a thermometer and ovulation symptoms.  · Post-ovulation method. This is timing sexual intercourse after you have ovulated.  Regardless of which type or method of contraception you choose, it is important that you use condoms to protect against the transmission of sexually transmitted diseases (STDs). Talk with your caregiver about which form of contraception is most appropriate for you.  Document Released: 05/12/2005 Document Revised: 08/04/2011 Document Reviewed: 09/18/2010  [employer]® Patient Information ©2013 [employer].

## 2012-05-28 ENCOUNTER — Ambulatory Visit (HOSPITAL_COMMUNITY)
Admission: RE | Admit: 2012-05-28 | Discharge: 2012-05-28 | Disposition: A | Discharge status: Home / Self Care | Payer: 59 | Source: Ambulatory Visit | Attending: Family Medicine | Admitting: Family Medicine

## 2012-05-28 VITALS — BP 129/69 | HR 92 | Wt 198.0 lb

## 2012-05-28 DIAGNOSIS — O24919 Unspecified diabetes mellitus in pregnancy, unspecified trimester: Secondary | ICD-10-CM | POA: Insufficient documentation

## 2012-05-28 DIAGNOSIS — O0990 Supervision of high risk pregnancy, unspecified, unspecified trimester: Secondary | ICD-10-CM

## 2012-05-28 DIAGNOSIS — E569 Vitamin deficiency, unspecified: Secondary | ICD-10-CM

## 2012-05-28 DIAGNOSIS — O10019 Pre-existing essential hypertension complicating pregnancy, unspecified trimester: Secondary | ICD-10-CM

## 2012-05-28 DIAGNOSIS — IMO0002 Reserved for concepts with insufficient information to code with codable children: Secondary | ICD-10-CM

## 2012-05-28 DIAGNOSIS — I10 Essential (primary) hypertension: Secondary | ICD-10-CM

## 2012-05-28 DIAGNOSIS — O09529 Supervision of elderly multigravida, unspecified trimester: Secondary | ICD-10-CM | POA: Insufficient documentation

## 2012-05-28 NOTE — Progress Notes (Signed)
Melissa Braun  was seen today for an ultrasound appointment.  See full report in [facility].  Impression: Single IUP at 24 1/7 weeks Normal interval anatomy Fetal growth is appropriate (38th %tile) Normal amniotic fluid volume  Recommendations: Recommend follow-up ultrasound examination in 4 weeks for interval growth.  Alpha Gula, MD

## 2012-06-07 ENCOUNTER — Encounter: Payer: 59 | Admitting: Family Medicine

## 2012-06-14 ENCOUNTER — Ambulatory Visit (INDEPENDENT_AMBULATORY_CARE_PROVIDER_SITE_OTHER): Payer: 59 | Admitting: Obstetrics & Gynecology

## 2012-06-14 VITALS — BP 137/77 | Temp 98.2°F | Wt 196.4 lb

## 2012-06-14 DIAGNOSIS — O99840 Bariatric surgery status complicating pregnancy, unspecified trimester: Secondary | ICD-10-CM

## 2012-06-14 DIAGNOSIS — O09529 Supervision of elderly multigravida, unspecified trimester: Secondary | ICD-10-CM

## 2012-06-14 DIAGNOSIS — O24919 Unspecified diabetes mellitus in pregnancy, unspecified trimester: Secondary | ICD-10-CM

## 2012-06-14 DIAGNOSIS — O10019 Pre-existing essential hypertension complicating pregnancy, unspecified trimester: Secondary | ICD-10-CM

## 2012-06-14 DIAGNOSIS — IMO0002 Reserved for concepts with insufficient information to code with codable children: Secondary | ICD-10-CM

## 2012-06-14 LAB — RPR

## 2012-06-14 LAB — POCT URINALYSIS DIP (DEVICE)
Bilirubin Urine: NEGATIVE
Hgb urine dipstick: NEGATIVE
Nitrite: NEGATIVE
Specific Gravity, Urine: 1.02 (ref 1.005–1.030)
pH: 6.5 (ref 5.0–8.0)

## 2012-06-14 LAB — COMPREHENSIVE METABOLIC PANEL
AST: 14 U/L (ref 0–37)
Alkaline Phosphatase: 69 U/L (ref 39–117)
BUN: 6 mg/dL (ref 6–23)
Creat: 0.43 mg/dL — ABNORMAL LOW (ref 0.50–1.10)
Glucose, Bld: 91 mg/dL (ref 70–99)
Total Bilirubin: 0.5 mg/dL (ref 0.3–1.2)

## 2012-06-14 LAB — FOLATE: Folate: >20 ng/mL

## 2012-06-14 LAB — CBC
HCT: 33.8 % — ABNORMAL LOW (ref 36.0–46.0)
Hemoglobin: 11.6 g/dL — ABNORMAL LOW (ref 12.0–15.0)
MCH: 29.5 pg (ref 26.0–34.0)
MCHC: 34.3 g/dL (ref 30.0–36.0)
MCV: 86 fL (ref 78.0–100.0)

## 2012-06-14 LAB — VITAMIN B12: Vitamin B-12: 235 pg/mL (ref 211–911)

## 2012-06-14 NOTE — Patient Instructions (Signed)
Return to clinic for any scheduled appointments or for any gynecologic concerns as needed.   

## 2012-06-14 NOTE — Progress Notes (Signed)
Patient reports fastings < 90, postprandials <110.  Emphasized importance of bringing log book/meter.  Will check HgA1C today.  Next growth scan is at the end of the month.  Checked nutrition labs today, will follow up next visit in two weeks.  Able to feel fetal movement during visit today.  No other complaints or concerns.  Fetal movement and labor precautions reviewed.

## 2012-06-14 NOTE — Progress Notes (Signed)
P=97  Patient is unsure of fetal movement.

## 2012-06-15 LAB — VITAMIN D 25 HYDROXY (VIT D DEFICIENCY, FRACTURES): Vit D, 25-Hydroxy: 26 ng/mL — ABNORMAL LOW (ref 30–89)

## 2012-06-18 LAB — VITAMIN D 1,25 DIHYDROXY
Vitamin D 1, 25 (OH)2 Total: 109 pg/mL — ABNORMAL HIGH (ref 18–72)
Vitamin D2 1, 25 (OH)2: <8 pg/mL
Vitamin D3 1, 25 (OH)2: 109 pg/mL

## 2012-06-25 ENCOUNTER — Ambulatory Visit (HOSPITAL_COMMUNITY)
Admission: RE | Admit: 2012-06-25 | Discharge: 2012-06-25 | Disposition: A | Discharge status: Home / Self Care | Payer: 59 | Source: Ambulatory Visit | Attending: Obstetrics and Gynecology | Admitting: Obstetrics and Gynecology

## 2012-06-25 VITALS — BP 129/77 | HR 95 | Wt 199.0 lb

## 2012-06-25 DIAGNOSIS — IMO0002 Reserved for concepts with insufficient information to code with codable children: Secondary | ICD-10-CM

## 2012-06-25 DIAGNOSIS — I10 Essential (primary) hypertension: Secondary | ICD-10-CM

## 2012-06-25 DIAGNOSIS — E569 Vitamin deficiency, unspecified: Secondary | ICD-10-CM

## 2012-06-25 DIAGNOSIS — O0990 Supervision of high risk pregnancy, unspecified, unspecified trimester: Secondary | ICD-10-CM

## 2012-06-25 DIAGNOSIS — O09529 Supervision of elderly multigravida, unspecified trimester: Secondary | ICD-10-CM | POA: Insufficient documentation

## 2012-06-25 DIAGNOSIS — O24919 Unspecified diabetes mellitus in pregnancy, unspecified trimester: Secondary | ICD-10-CM | POA: Insufficient documentation

## 2012-06-25 DIAGNOSIS — O10019 Pre-existing essential hypertension complicating pregnancy, unspecified trimester: Secondary | ICD-10-CM

## 2012-06-28 ENCOUNTER — Ambulatory Visit (INDEPENDENT_AMBULATORY_CARE_PROVIDER_SITE_OTHER): Payer: 59 | Admitting: Obstetrics & Gynecology

## 2012-06-28 VITALS — BP 122/72 | Temp 97.7°F | Wt 199.2 lb

## 2012-06-28 DIAGNOSIS — O24919 Unspecified diabetes mellitus in pregnancy, unspecified trimester: Secondary | ICD-10-CM

## 2012-06-28 LAB — POCT URINALYSIS DIP (DEVICE)
Bilirubin Urine: NEGATIVE
Glucose, UA: NEGATIVE mg/dL
Hgb urine dipstick: NEGATIVE
Ketones, ur: NEGATIVE mg/dL
Nitrite: NEGATIVE
pH: 7 (ref 5.0–8.0)

## 2012-06-28 NOTE — Patient Instructions (Signed)
Gestational Diabetes Mellitus Gestational diabetes mellitus (GDM) is diabetes that occurs only during pregnancy. This happens when the body cannot properly handle the glucose (sugar) that increases in the blood after eating. During pregnancy, insulin resistance (reduced sensitivity to insulin) occurs because of the release of hormones from the placenta. Usually, the pancreas of pregnant women produces enough insulin to overcome the resistance that occurs. However, in gestational diabetes, the insulin is there but it does not work effectively. If the resistance is severe enough that the pancreas does not produce enough insulin, extra glucose builds up in the blood.  WHO IS AT RISK FOR DEVELOPING GESTATIONAL DIABETES?  Women with a history of diabetes in the family.  Women over age 25.  Women who are overweight.  Women in certain ethnic groups (Hispanic, African American, Native American, Asian and Pacific Islander). WHAT CAN HAPPEN TO THE BABY? If the mother's blood glucose is too high while she is pregnant, the extra sugar will travel through the umbilical cord to the baby. Some of the problems the baby may have are:  Large Baby - If the baby receives too much sugar, the baby will gain more weight. This may cause the baby to be too large to be born normally (vaginally) and a Cesarean section (C-section) may be needed.  Low Blood Glucose (hypoglycemia)  The baby makes extra insulin, in response to the extra sugar its gets from its mother. When the baby is born and no longer needs this extra insulin, the baby's blood glucose level may drop.  Jaundice (yellow coloring of the skin and eyes)  This is fairly common in babies. It is caused from a build-up of the chemical called bilirubin. This is rarely serious, but is seen more often in babies whose mothers had gestational diabetes. RISKS TO THE MOTHER Women who have had gestational diabetes may be at higher risk for some problems,  including:  Preeclampsia or toxemia, which includes problems with high blood pressure. Blood pressure and protein levels in the urine must be checked frequently.  Infections.  Cesarean section (C-section) for delivery.  Developing Type 2 diabetes later in life. About 30-50% will develop diabetes later, especially if obese. DIAGNOSIS  The hormones that cause insulin resistance are highest at about 24-28 weeks of pregnancy. If symptoms are experienced, they are much like symptoms you would normally expect during pregnancy.  GDM is often diagnosed using a two part method: 1. After 24-28 weeks of pregnancy, the woman drinks a glucose solution and takes a blood test. If the glucose level is high, a second test will be given. 2. Oral Glucose Tolerance Test (OGTT) which is 3 hours long  After not eating overnight, the blood glucose is checked. The woman drinks a glucose solution, and hourly blood glucose tests are taken. If the woman has risk factors for GDM, the caregiver may test earlier than 24 weeks of pregnancy. TREATMENT  Treatment of GDM is directed at keeping the mother's blood glucose level normal, and may include:  Meal planning.  Taking insulin or other medicine to control your blood glucose level.  Exercise.  Keeping a daily record of the foods you eat.  Blood glucose monitoring and keeping a record of your blood glucose levels.  May monitor ketone levels in the urine, although this is no longer considered necessary in most pregnancies. HOME CARE INSTRUCTIONS  While you are pregnant:  Follow your caregiver's advice regarding your prenatal appointments, meal planning, exercise, medicines, vitamins, blood and other tests, and physical   activities.  Keep a record of your meals, blood glucose tests, and the amount of insulin you are taking (if any). Show this to your caregiver at every prenatal visit.  If you have GDM, you may have problems with hypoglycemia (low blood glucose).  You may suspect this if you become suddenly dizzy, feel shaky, and/or weak. If you think this is happening and you have a glucose meter, try to test your blood glucose level. Follow your caregiver's advice for when and how to treat your low blood glucose. Generally, the 15:15 rule is followed: Treat by consuming 15 grams of carbohydrates, wait 15 minutes, and recheck blood glucose. Examples of 15 grams of carbohydrates are:  1 cup skim or low-fat milk.   cup juice.  3-4 glucose tablets.  5-6 hard candies.  1 small box raisins.   cup regular soda pop.  Practice good hygiene, to avoid infections.  Do not smoke. SEEK MEDICAL CARE IF:   You develop abnormal vaginal discharge, with or without itching.  You become weak and tired more than expected.  You seem to sweat a lot.  You have a sudden increase in weight, 5 pounds or more in one week.  You are losing weight, 3 pounds or more in a week.  Your blood glucose level is high, and you need instructions on what to do about it. SEEK IMMEDIATE MEDICAL CARE IF:   You develop a severe headache.  You faint or pass out.  You develop nausea and vomiting.  You become disoriented or confused.  You have a convulsion.  You develop vision problems.  You develop stomach pain.  You develop vaginal bleeding.  You develop uterine contractions.  You have leaking or a gush of fluid from the vagina. AFTER YOU HAVE THE BABY:  Go to all of your follow-up appointments, and have blood tests as advised by your caregiver.  Maintain a healthy lifestyle, to prevent diabetes in the future. This includes:  Following a healthy meal plan.  Controlling your weight.  Getting enough exercise and proper rest.  Do not smoke.  Breastfeed your baby if you can. This will lower the chance of you and your baby developing diabetes later in life. For more information about diabetes, go to the American Diabetes Association at:  www.americandiabetesassociation.org. For more information about gestational diabetes, go to the American Congress of Obstetricians and Gynecologists at: www.acog.org. Document Released: 08/18/2000 Document Revised: 08/04/2011 Document Reviewed: 03/12/2009 ExitCare Patient Information 2013 [employer].  

## 2012-06-28 NOTE — Progress Notes (Signed)
Seeing a ophthalmologist retinopathy. States her sugars are the same as ever but did not bring her book. Growth [place] 2/1 43%ile

## 2012-06-28 NOTE — Progress Notes (Signed)
Patient reports a very sharp pain on her lower right side.  Last night it was more severe than usual.  p=97

## 2012-07-12 ENCOUNTER — Other Ambulatory Visit: Payer: Self-pay | Admitting: Obstetrics & Gynecology

## 2012-07-12 ENCOUNTER — Ambulatory Visit (INDEPENDENT_AMBULATORY_CARE_PROVIDER_SITE_OTHER): Payer: 59 | Admitting: Obstetrics & Gynecology

## 2012-07-12 VITALS — BP 135/78 | Temp 97.0°F | Wt 202.0 lb

## 2012-07-12 DIAGNOSIS — O99840 Bariatric surgery status complicating pregnancy, unspecified trimester: Secondary | ICD-10-CM

## 2012-07-12 DIAGNOSIS — O24919 Unspecified diabetes mellitus in pregnancy, unspecified trimester: Secondary | ICD-10-CM

## 2012-07-12 DIAGNOSIS — IMO0002 Reserved for concepts with insufficient information to code with codable children: Secondary | ICD-10-CM

## 2012-07-12 DIAGNOSIS — O09529 Supervision of elderly multigravida, unspecified trimester: Secondary | ICD-10-CM

## 2012-07-12 MED ORDER — VITAMIN D3 75 MCG (3000 UT) PO TABS: 1.0000 | ORAL_TABLET | Freq: Every day | ORAL | Status: DC

## 2012-07-12 MED ORDER — FERROUS SULFATE 325 (65 FE) MG PO TABS: 325.0000 mg | ORAL_TABLET | Freq: Every day | ORAL | Status: DC

## 2012-07-12 NOTE — Progress Notes (Signed)
Did not bring log book; reports fasting 80s, 2 hour postprandial B 115-120; L 110-120; D 160-170s.  Increase pre dinner Novolog to 18 units; new regimen NPH 10/10; Novolog 05/06/17.  Emphasized bringing log book for optimization of regimen.  No other complaints or concerns.  Fetal movement and labor precautions reviewed.  Refilled Rx per patient's request.

## 2012-07-12 NOTE — Patient Instructions (Signed)
Return to clinic for any obstetric concerns or go to MAU for evaluation  

## 2012-07-12 NOTE — Progress Notes (Signed)
Pulse: 100 Needs vit d and iron sent to [facility] pharmacy, correct pharmacy is selected, just needs to be sent in as a 90 day rx.

## 2012-07-13 ENCOUNTER — Inpatient Hospital Stay (HOSPITAL_COMMUNITY)
Admission: AD | Admit: 2012-07-13 | Discharge: 2012-07-13 | Disposition: A | Discharge status: Home / Self Care | Payer: 59 | Source: Ambulatory Visit | Attending: Obstetrics and Gynecology | Admitting: Obstetrics and Gynecology

## 2012-07-13 ENCOUNTER — Encounter (HOSPITAL_COMMUNITY): Payer: Self-pay | Admitting: Obstetrics and Gynecology

## 2012-07-13 DIAGNOSIS — O10019 Pre-existing essential hypertension complicating pregnancy, unspecified trimester: Secondary | ICD-10-CM | POA: Insufficient documentation

## 2012-07-13 DIAGNOSIS — R300 Dysuria: Secondary | ICD-10-CM | POA: Insufficient documentation

## 2012-07-13 DIAGNOSIS — R030 Elevated blood-pressure reading, without diagnosis of hypertension: Secondary | ICD-10-CM

## 2012-07-13 DIAGNOSIS — O368190 Decreased fetal movements, unspecified trimester, not applicable or unspecified: Secondary | ICD-10-CM | POA: Insufficient documentation

## 2012-07-13 DIAGNOSIS — Z3689 Encounter for other specified antenatal screening: Secondary | ICD-10-CM

## 2012-07-13 LAB — URINE MICROSCOPIC-ADD ON

## 2012-07-13 LAB — URINALYSIS, ROUTINE W REFLEX MICROSCOPIC
Bilirubin Urine: NEGATIVE
Hgb urine dipstick: NEGATIVE
Nitrite: NEGATIVE
Specific Gravity, Urine: 1.015 (ref 1.005–1.030)
Urobilinogen, UA: 0.2 mg/dL (ref 0.0–1.0)
pH: 6 (ref 5.0–8.0)

## 2012-07-13 LAB — POCT URINALYSIS DIP (DEVICE)
Ketones, ur: NEGATIVE mg/dL
Protein, ur: NEGATIVE mg/dL
Specific Gravity, Urine: 1.015 (ref 1.005–1.030)
Urobilinogen, UA: 1 mg/dL (ref 0.0–1.0)
pH: 6 (ref 5.0–8.0)

## 2012-07-13 LAB — COMPREHENSIVE METABOLIC PANEL
ALT: 9 U/L (ref 0–35)
AST: 14 U/L (ref 0–37)
CO2: 19 mEq/L (ref 19–32)
Chloride: 108 mEq/L (ref 96–112)
GFR calc Af Amer: >90 mL/min (ref >90)
GFR calc non Af Amer: >90 mL/min (ref >90)
Glucose, Bld: 124 mg/dL — ABNORMAL HIGH (ref 70–99)
Sodium: 136 mEq/L (ref 135–145)
Total Bilirubin: 0.3 mg/dL (ref 0.3–1.2)

## 2012-07-13 LAB — CBC
Hemoglobin: 12.3 g/dL (ref 12.0–15.0)
MCH: 29.9 pg (ref 26.0–34.0)
MCV: 83.7 fL (ref 78.0–100.0)
Platelets: 142 10*3/uL — ABNORMAL LOW (ref 150–400)
RBC: 4.11 MIL/uL (ref 3.87–5.11)
WBC: 8.6 10*3/uL (ref 4.0–10.5)

## 2012-07-13 NOTE — MAU Note (Signed)
"  I noticed that he hasn't been moving as much all day.  I did feel a good kick when I first got here.  No pain, VB or LOF."

## 2012-07-13 NOTE — MAU Provider Note (Signed)
History     CSN: [id number]  Arrival date and time: 07/13/12 1556   None     Chief Complaint  Patient presents with  . Decreased Fetal Movement   HPI 37 y/o G1P0 at [redacted]w[redacted]d here for decreased fetal movement. She states that she felt the baby moving normally yesterday and got through a half of day at work without noticing movement spurring her to come to the MAU. She denies LOF, vaginal bleeding and vaginal discharge. She also complains of some dysuria starting 1 day ago. She denies headaches, changes in vision, dyspnea, and chest pain. She has had some swelling throughout her pregnancy and has tried compression stockings which have not helped much.   OB History   Grav Para Term Preterm Abortions TAB SAB Ect Mult Living   1               Past Medical History  Diagnosis Date  . Diabetes mellitus AGE 24    [facility] Nap  . Infertility   . Acne   . LGSIL (low grade squamous intraepithelial dysplasia) 2008  . Sickle cell trait   . Heart murmur   . Hypertension     past h/o elevated BP with taking Tylenol Sinus    Past Surgical History  Procedure Laterality Date  . Exploratory laparoscopy      DUE TO RLQ PAIN  . Gastric bypass  nov. 2011    [facility]ary Martelle    Family History  Problem Relation Age of Onset  . Diabetes Father   . Hypertension Father   . Cancer Father     prostate and mouth  . Sickle cell anemia Mother   . Anemia Mother   . Sickle cell trait Sister   . Hypertension Sister     History  Substance Use Topics  . Smoking status: Never Smoker   . Smokeless tobacco: Never Used  . Alcohol Use: No    Allergies:  Allergies  Allergen Reactions  . Bactrim     hives  . Doxycycline     Hives   . Levofloxacin Itching and Other (See Comments)    Reaction unknown    Prescriptions prior to admission  Medication Sig Dispense Refill  . ACCU-CHEK FASTCLIX LANCETS MISC 1 Units by Percutaneous route 4 (four) times daily.  100 each  12  . ampicillin (PRINCIPEN) 250 MG  capsule Take 250 mg by mouth 2 (two) times daily.      . Azelaic Acid (FINACEA) 15 % cream Apply topically 2 (two) times daily. After skin is thoroughly washed and patted dry, gently but thoroughly massage a thin film of azelaic acid cream into the affected area twice daily, in the morning and evening.      . cetirizine (ZYRTEC) 10 MG tablet Take 10 mg by mouth daily.      . Cholecalciferol (VITAMIN D3) 3000 UNITS TABS Take 1 tablet by mouth daily.  90 tablet  3  . ferrous sulfate 325 (65 FE) MG tablet Take 1 tablet (325 mg total) by mouth daily.  90 tablet  3  . glucose blood (ACCU-CHEK SMARTVIEW) test strip Check blood sugars 4x/daily  100 each  12  . insulin aspart (NOVOLOG) 100 UNIT/ML injection Inject 12 units before breakfast and lunch; 15 units before dinner  5 vial  2  . insulin NPH (HUMULIN N,NOVOLIN N) 100 UNIT/ML injection Inject 10 Units into the skin 2 (two) times daily.  3 vial  2  . Insulin Syringes, Disposable,  U-100 0.5 ML MISC Use 4 times daily as directed  100 each  3  . Prenatal Vit-Fe Fumarate-FA (PRENATAL MULTIVITAMIN) TABS Take 1 tablet by mouth daily.      . [DISCONTINUED] PRENATAL VITAMINS PO Take 1 tablet by mouth daily.        ROS Review of body systems negative other than notes in HPI.  Physical Exam   Blood pressure 137/79, pulse 92, temperature 97.7 F (36.5 C), temperature source Oral, resp. rate 18, height 5\' 1"  (1.549 m), weight 92.806 kg (204 lb 9.6 oz), last menstrual period 12/11/2011.  Physical Exam Gen: NAD, alert, cooperative with exam HEENT: NCAT, EOMI, PERRL CV: RRR, good S1/S2, no murmur Resp: CTABL, no wheezes, non-labored Abd: Soft pregnant abdomen without guarding or tenderness to palpation Ext: trace to 1+ pitting edema of BL LE Neuro: Alert and oriented, No gross deficits  NST:  Fetal tracing baseline 140, moderate variability, accels present, no decels Toco: No contractions observed  MAU Course  Procedures   Results for orders  placed during the hospital encounter of 07/13/12 (from the past 48 hour(s))  URINALYSIS, ROUTINE W REFLEX MICROSCOPIC     Status: Abnormal   Collection Time    07/13/12  3:55 PM      Result Value Range   Color, Urine YELLOW  YELLOW   APPearance CLEAR  CLEAR   Specific Gravity, Urine 1.015  1.005 - 1.030   pH 6.0  5.0 - 8.0   Glucose, UA NEGATIVE  NEGATIVE mg/dL   Hgb urine dipstick NEGATIVE  NEGATIVE   Bilirubin Urine NEGATIVE  NEGATIVE   Ketones, ur NEGATIVE  NEGATIVE mg/dL   Protein, ur NEGATIVE  NEGATIVE mg/dL   Urobilinogen, UA 0.2  0.0 - 1.0 mg/dL   Nitrite NEGATIVE  NEGATIVE   Leukocytes, UA TRACE (*) NEGATIVE  PROTEIN / CREATININE RATIO, URINE     Status: Abnormal   Collection Time    07/13/12  3:55 PM      Result Value Range   Creatinine, Urine 60.53     Total Protein, Urine 18.9     Comment: NO NORMAL RANGE ESTABLISHED FOR THIS TEST   PROTEIN CREATININE RATIO 0.31 (*) 0.00 - 0.15  URINE MICROSCOPIC-ADD ON     Status: None   Collection Time    07/13/12  3:55 PM      Result Value Range   Squamous Epithelial / LPF RARE  RARE   WBC, UA 0-2  <3 WBC/hpf   RBC / HPF 0-2  <3 RBC/hpf  CBC     Status: Abnormal   Collection Time    07/13/12  5:21 PM      Result Value Range   WBC 8.6  4.0 - 10.5 K/uL   RBC 4.11  3.87 - 5.11 MIL/uL   Hemoglobin 12.3  12.0 - 15.0 g/dL   HCT 16.1 (*) 09.6 - 04.5 %   MCV 83.7  78.0 - 100.0 fL   MCH 29.9  26.0 - 34.0 pg   MCHC 35.8  30.0 - 36.0 g/dL   RDW 40.9  81.1 - 91.4 %   Platelets 142 (*) 150 - 400 K/uL  COMPREHENSIVE METABOLIC PANEL     Status: Abnormal   Collection Time    07/13/12  5:21 PM      Result Value Range   Sodium 136  135 - 145 mEq/L   Potassium 3.7  3.5 - 5.1 mEq/L   Chloride 108  96 - 112 mEq/L  CO2 19  19 - 32 mEq/L   Glucose, Bld 124 (*) 70 - 99 mg/dL   BUN 5 (*) 6 - 23 mg/dL   Creatinine, Ser 5.62 (*) 0.50 - 1.10 mg/dL   Calcium 8.4  8.4 - 13.0 mg/dL   Total Protein 5.8 (*) 6.0 - 8.3 g/dL   Albumin 2.7 (*) 3.5  - 5.2 g/dL   AST 14  0 - 37 U/L   ALT 9  0 - 35 U/L   Alkaline Phosphatase 87  39 - 117 U/L   Total Bilirubin 0.3  0.3 - 1.2 mg/dL   GFR calc non Af Amer >90  >90 mL/min   GFR calc Af Amer >90  >90 mL/min   Comment:            The eGFR has been calculated     using the CKD EPI equation.     This calculation has not been     validated in all clinical     situations.     eGFR's persistently     <90 mL/min signify     possible Chronic Kidney Disease.    Assessment and Plan  37 y/o G1P0 at [redacted]w[redacted]d with decreased fetal movement and dysuria.   Decreased fetal movement - Reassuring NST- category 1 - reassured mother and advised to f/u in [facility]  Dysuria - UA normal   HTN, Edema - Hypertensive to 153/76 on presentation, possibly increased edema today - Cre and Liver enzymes WNL, Urine protein pending , Urine cre/protein ratio pending - F/u in [facility], 24 hour urine protein ordered  Kevin Fenton 07/13/2012, 5:52 PM   I have seen this patient and agree with the above resident's note.  Pt to return 24 hour urine to [facility] on Thursday and have BP check.   LEFTWICH-KIRBY, Terel Bann Certified Nurse-Midwife

## 2012-07-13 NOTE — Progress Notes (Signed)
Notified of elevated BP's; orders for sending pt home with a 24 hr urine and instructions to begin it tomorrow morning and return it to [facility] on Thursday.  Pt also to have BP recheck on Thursday

## 2012-07-15 ENCOUNTER — Ambulatory Visit (INDEPENDENT_AMBULATORY_CARE_PROVIDER_SITE_OTHER): Payer: 59 | Admitting: *Deleted

## 2012-07-15 ENCOUNTER — Encounter: Payer: Self-pay | Admitting: *Deleted

## 2012-07-15 VITALS — BP 127/83 | HR 99

## 2012-07-15 DIAGNOSIS — O10019 Pre-existing essential hypertension complicating pregnancy, unspecified trimester: Secondary | ICD-10-CM

## 2012-07-15 NOTE — Progress Notes (Signed)
Pt here for BP check only and brought 24 hr urine today-labs drawn on 2/18. She denies h/a, visual disturbances and RUQ pain- instructed to go to MAU if these sx occur.  Pt voiced understanding.

## 2012-07-15 NOTE — MAU Provider Note (Signed)
Attestation of Attending Supervision of Advanced Practitioner (CNM/NP): Evaluation and management procedures were performed by the Advanced Practitioner under my supervision and collaboration.  I have reviewed the Advanced Practitioner's note and chart, and I agree with the management and plan.  Quinne Pires 07/15/2012 12:11 PM   

## 2012-07-21 LAB — CREATININE CLEARANCE, URINE, 24 HOUR
Creatinine Clearance: 24 mL/min — ABNORMAL LOW (ref 75–115)
Creatinine, 24H Ur: 146 mg/d — ABNORMAL LOW (ref 700–1800)

## 2012-07-21 LAB — PROTEIN, URINE, 24 HOUR: Protein, 24H Urine: 18 mg/d — ABNORMAL LOW (ref 50–100)

## 2012-07-22 ENCOUNTER — Other Ambulatory Visit: Payer: Self-pay | Admitting: Family Medicine

## 2012-07-22 DIAGNOSIS — O09529 Supervision of elderly multigravida, unspecified trimester: Secondary | ICD-10-CM

## 2012-07-23 ENCOUNTER — Ambulatory Visit (HOSPITAL_COMMUNITY)
Admission: RE | Admit: 2012-07-23 | Discharge: 2012-07-23 | Disposition: A | Discharge status: Home / Self Care | Payer: 59 | Source: Ambulatory Visit | Attending: Family Medicine | Admitting: Family Medicine

## 2012-07-23 VITALS — BP 148/82 | HR 97 | Wt 200.5 lb

## 2012-07-23 DIAGNOSIS — E139 Other specified diabetes mellitus without complications: Secondary | ICD-10-CM

## 2012-07-23 DIAGNOSIS — E569 Vitamin deficiency, unspecified: Secondary | ICD-10-CM

## 2012-07-23 DIAGNOSIS — O24919 Unspecified diabetes mellitus in pregnancy, unspecified trimester: Secondary | ICD-10-CM | POA: Insufficient documentation

## 2012-07-23 DIAGNOSIS — O09529 Supervision of elderly multigravida, unspecified trimester: Secondary | ICD-10-CM | POA: Insufficient documentation

## 2012-07-23 DIAGNOSIS — Z3689 Encounter for other specified antenatal screening: Secondary | ICD-10-CM | POA: Insufficient documentation

## 2012-07-23 NOTE — Progress Notes (Addendum)
Melissa Braun  was seen today for an ultrasound appointment.  See full report in [facility].  Impression: Single IUP at 32 1/7 weeks Interval growth is appropriate (59th %tile) Active fetus with BPP of 8/8 Normal amniotic fluid volume  Recommendation: Recommend follow-up ultrasound examination in 4 weeks Recommend 2x weekly nonstress tests with weekly amniotic fluid volume assessment.   Alpha Gula, MD

## 2012-07-26 ENCOUNTER — Ambulatory Visit (INDEPENDENT_AMBULATORY_CARE_PROVIDER_SITE_OTHER): Payer: 59 | Admitting: Obstetrics and Gynecology

## 2012-07-26 ENCOUNTER — Encounter: Payer: Self-pay | Admitting: Obstetrics and Gynecology

## 2012-07-26 ENCOUNTER — Other Ambulatory Visit: Payer: Self-pay | Admitting: Obstetrics and Gynecology

## 2012-07-26 VITALS — BP 117/77 | Wt 198.4 lb

## 2012-07-26 DIAGNOSIS — D573 Sickle-cell trait: Secondary | ICD-10-CM

## 2012-07-26 DIAGNOSIS — E669 Obesity, unspecified: Secondary | ICD-10-CM

## 2012-07-26 DIAGNOSIS — I10 Essential (primary) hypertension: Secondary | ICD-10-CM

## 2012-07-26 DIAGNOSIS — O99844 Bariatric surgery status complicating childbirth: Secondary | ICD-10-CM

## 2012-07-26 DIAGNOSIS — O10013 Pre-existing essential hypertension complicating pregnancy, third trimester: Secondary | ICD-10-CM

## 2012-07-26 DIAGNOSIS — O0990 Supervision of high risk pregnancy, unspecified, unspecified trimester: Secondary | ICD-10-CM

## 2012-07-26 DIAGNOSIS — O09529 Supervision of elderly multigravida, unspecified trimester: Secondary | ICD-10-CM

## 2012-07-26 DIAGNOSIS — O24913 Unspecified diabetes mellitus in pregnancy, third trimester: Secondary | ICD-10-CM

## 2012-07-26 DIAGNOSIS — O10019 Pre-existing essential hypertension complicating pregnancy, unspecified trimester: Secondary | ICD-10-CM

## 2012-07-26 DIAGNOSIS — O24919 Unspecified diabetes mellitus in pregnancy, unspecified trimester: Secondary | ICD-10-CM

## 2012-07-26 DIAGNOSIS — O09523 Supervision of elderly multigravida, third trimester: Secondary | ICD-10-CM

## 2012-07-26 DIAGNOSIS — O99843 Bariatric surgery status complicating pregnancy, third trimester: Secondary | ICD-10-CM

## 2012-07-26 LAB — POCT URINALYSIS DIP (DEVICE)
Glucose, UA: NEGATIVE mg/dL
Protein, ur: NEGATIVE mg/dL
Specific Gravity, Urine: 1.015 (ref 1.005–1.030)
Urobilinogen, UA: 2 mg/dL — ABNORMAL HIGH (ref 0.0–1.0)
pH: 7 (ref 5.0–8.0)

## 2012-07-26 MED ORDER — INSULIN NPH (HUMAN) (ISOPHANE) 100 UNIT/ML ~~LOC~~ SUSP: SUBCUTANEOUS | Status: DC

## 2012-07-26 MED ORDER — INSULIN ASPART 100 UNIT/ML ~~LOC~~ SOLN: SUBCUTANEOUS | Status: DC

## 2012-07-26 NOTE — Patient Instructions (Signed)
New insulin regimen Novolog- 12 with breakfast; 12 with lunch; and 17 with dinner NPH 12 in am and 10 at bedtime

## 2012-07-26 NOTE — Progress Notes (Signed)
P-90 

## 2012-07-26 NOTE — Progress Notes (Signed)
NSt reviewed and reactive. Patient did not bring meter or log book. Reports fasting and post meal all within normal range except 2 hr post dinner are still elevated 140-150's. Will increase morning NPH to 12. Making new regimen NPH 12/10 and regular 05/06/16. Reviewed importance of bringing log book or meter to each visit. FM/PTL precautions reviewed

## 2012-07-29 ENCOUNTER — Ambulatory Visit (INDEPENDENT_AMBULATORY_CARE_PROVIDER_SITE_OTHER): Payer: 59 | Admitting: *Deleted

## 2012-07-29 VITALS — BP 146/84 | Wt 200.9 lb

## 2012-07-29 DIAGNOSIS — O24919 Unspecified diabetes mellitus in pregnancy, unspecified trimester: Secondary | ICD-10-CM

## 2012-07-29 DIAGNOSIS — O24913 Unspecified diabetes mellitus in pregnancy, third trimester: Secondary | ICD-10-CM

## 2012-07-29 NOTE — Progress Notes (Signed)
P=93 

## 2012-07-29 NOTE — Progress Notes (Signed)
NST reviewed and reactive.  Nikira Kushnir L. Harraway-Smith, M.D., FACOG    

## 2012-08-02 ENCOUNTER — Encounter: Payer: Self-pay | Admitting: Obstetrics and Gynecology

## 2012-08-02 ENCOUNTER — Ambulatory Visit (INDEPENDENT_AMBULATORY_CARE_PROVIDER_SITE_OTHER): Payer: 59 | Admitting: Obstetrics and Gynecology

## 2012-08-02 ENCOUNTER — Other Ambulatory Visit: Payer: Self-pay | Admitting: Obstetrics & Gynecology

## 2012-08-02 VITALS — BP 143/90 | Wt 203.5 lb

## 2012-08-02 DIAGNOSIS — O99840 Bariatric surgery status complicating pregnancy, unspecified trimester: Secondary | ICD-10-CM

## 2012-08-02 DIAGNOSIS — E669 Obesity, unspecified: Secondary | ICD-10-CM

## 2012-08-02 DIAGNOSIS — O10019 Pre-existing essential hypertension complicating pregnancy, unspecified trimester: Secondary | ICD-10-CM

## 2012-08-02 DIAGNOSIS — O0990 Supervision of high risk pregnancy, unspecified, unspecified trimester: Secondary | ICD-10-CM

## 2012-08-02 DIAGNOSIS — O24913 Unspecified diabetes mellitus in pregnancy, third trimester: Secondary | ICD-10-CM

## 2012-08-02 DIAGNOSIS — O09529 Supervision of elderly multigravida, unspecified trimester: Secondary | ICD-10-CM

## 2012-08-02 DIAGNOSIS — O24919 Unspecified diabetes mellitus in pregnancy, unspecified trimester: Secondary | ICD-10-CM

## 2012-08-02 DIAGNOSIS — O09523 Supervision of elderly multigravida, third trimester: Secondary | ICD-10-CM

## 2012-08-02 DIAGNOSIS — I10 Essential (primary) hypertension: Secondary | ICD-10-CM

## 2012-08-02 DIAGNOSIS — D573 Sickle-cell trait: Secondary | ICD-10-CM

## 2012-08-02 DIAGNOSIS — O99843 Bariatric surgery status complicating pregnancy, third trimester: Secondary | ICD-10-CM

## 2012-08-02 DIAGNOSIS — O10013 Pre-existing essential hypertension complicating pregnancy, third trimester: Secondary | ICD-10-CM

## 2012-08-02 LAB — POCT URINALYSIS DIP (DEVICE)
Bilirubin Urine: NEGATIVE
Hgb urine dipstick: NEGATIVE
Nitrite: NEGATIVE
pH: 6 (ref 5.0–8.0)

## 2012-08-02 NOTE — Progress Notes (Signed)
Pulse- 94 

## 2012-08-02 NOTE — Progress Notes (Signed)
**Note De-Identified Melissa Braun Obfuscation** NST reviewed and reactive. Patient did not bring meter or log book today. She reports significant improvements in her glucose levels including the post dinner values. Reminded patient to bring log book at every visit to optimize her care and ultimately the well being of infant. FM/PTL precautions. F/U MFM ultrasound on 3/28

## 2012-08-05 ENCOUNTER — Ambulatory Visit (INDEPENDENT_AMBULATORY_CARE_PROVIDER_SITE_OTHER): Payer: 59 | Admitting: *Deleted

## 2012-08-05 VITALS — BP 151/82

## 2012-08-05 DIAGNOSIS — O10019 Pre-existing essential hypertension complicating pregnancy, unspecified trimester: Secondary | ICD-10-CM

## 2012-08-05 DIAGNOSIS — O24913 Unspecified diabetes mellitus in pregnancy, third trimester: Secondary | ICD-10-CM

## 2012-08-05 DIAGNOSIS — O24919 Unspecified diabetes mellitus in pregnancy, unspecified trimester: Secondary | ICD-10-CM

## 2012-08-05 DIAGNOSIS — O10013 Pre-existing essential hypertension complicating pregnancy, third trimester: Secondary | ICD-10-CM

## 2012-08-05 NOTE — Progress Notes (Signed)
P = 94  Pt denies H/A or visual disturbances

## 2012-08-09 ENCOUNTER — Ambulatory Visit (INDEPENDENT_AMBULATORY_CARE_PROVIDER_SITE_OTHER): Payer: 59 | Admitting: Obstetrics & Gynecology

## 2012-08-09 VITALS — BP 145/78 | Wt 204.3 lb

## 2012-08-09 DIAGNOSIS — O24913 Unspecified diabetes mellitus in pregnancy, third trimester: Secondary | ICD-10-CM

## 2012-08-09 DIAGNOSIS — O09529 Supervision of elderly multigravida, unspecified trimester: Secondary | ICD-10-CM

## 2012-08-09 DIAGNOSIS — O10013 Pre-existing essential hypertension complicating pregnancy, third trimester: Secondary | ICD-10-CM

## 2012-08-09 DIAGNOSIS — E139 Other specified diabetes mellitus without complications: Secondary | ICD-10-CM

## 2012-08-09 DIAGNOSIS — O09523 Supervision of elderly multigravida, third trimester: Secondary | ICD-10-CM

## 2012-08-09 DIAGNOSIS — O24919 Unspecified diabetes mellitus in pregnancy, unspecified trimester: Secondary | ICD-10-CM

## 2012-08-09 DIAGNOSIS — E119 Type 2 diabetes mellitus without complications: Secondary | ICD-10-CM

## 2012-08-09 DIAGNOSIS — O10019 Pre-existing essential hypertension complicating pregnancy, unspecified trimester: Secondary | ICD-10-CM

## 2012-08-09 MED ORDER — INSULIN ASPART 100 UNIT/ML ~~LOC~~ SOLN: SUBCUTANEOUS | Status: DC

## 2012-08-09 NOTE — Progress Notes (Signed)
313 NSt reviewed and reactive

## 2012-08-09 NOTE — Progress Notes (Signed)
P=93 

## 2012-08-09 NOTE — Progress Notes (Signed)
NST performed today was reviewed and was found to be reactive.  AFI of 13.7 on 08/05/12; vertex.  Continue recommended antenatal testing and prenatal care.  On review of blood sugars, fasting, postprandials after breakfast and lunch are good; dinner postprandials still in 140s.  Increased dinnertime regular insulin; new regimen NPH 12/10 and Regular 05/07/19.  Will reevaluate next week.  Initial BP today elevated, repeat was better.  Patient denies any symptoms.  Preeclampsia precautions reviewed.  No other complaints or concerns.  Fetal movement and labor precautions reviewed.  Patient is scheduled for growth scan with MFM on 08/20/12.

## 2012-08-09 NOTE — Progress Notes (Signed)
3/13 NST reviewed and reactive

## 2012-08-09 NOTE — Patient Instructions (Signed)
Return to clinic for any obstetric concerns or go to MAU for evaluation  

## 2012-08-12 ENCOUNTER — Ambulatory Visit (INDEPENDENT_AMBULATORY_CARE_PROVIDER_SITE_OTHER): Payer: 59 | Admitting: *Deleted

## 2012-08-12 VITALS — BP 149/86

## 2012-08-12 DIAGNOSIS — O24919 Unspecified diabetes mellitus in pregnancy, unspecified trimester: Secondary | ICD-10-CM

## 2012-08-12 DIAGNOSIS — O09519 Supervision of elderly primigravida, unspecified trimester: Secondary | ICD-10-CM

## 2012-08-12 DIAGNOSIS — O24913 Unspecified diabetes mellitus in pregnancy, third trimester: Secondary | ICD-10-CM

## 2012-08-12 NOTE — Progress Notes (Signed)
P = 92 

## 2012-08-16 ENCOUNTER — Encounter: Payer: Self-pay | Admitting: Family Medicine

## 2012-08-16 ENCOUNTER — Other Ambulatory Visit: Payer: Self-pay | Admitting: Family Medicine

## 2012-08-16 ENCOUNTER — Ambulatory Visit (INDEPENDENT_AMBULATORY_CARE_PROVIDER_SITE_OTHER): Payer: 59 | Admitting: Family Medicine

## 2012-08-16 ENCOUNTER — Other Ambulatory Visit: Payer: 59

## 2012-08-16 VITALS — BP 151/81 | Wt 209.8 lb

## 2012-08-16 DIAGNOSIS — O24919 Unspecified diabetes mellitus in pregnancy, unspecified trimester: Secondary | ICD-10-CM

## 2012-08-16 DIAGNOSIS — O24913 Unspecified diabetes mellitus in pregnancy, third trimester: Secondary | ICD-10-CM

## 2012-08-16 LAB — POCT URINALYSIS DIP (DEVICE)
Bilirubin Urine: NEGATIVE
Glucose, UA: NEGATIVE mg/dL
Nitrite: NEGATIVE

## 2012-08-16 NOTE — Progress Notes (Signed)
NST reviewed and reactive. FBS 74-89 2 hr pp 108-135, mostly dinners are slightly out of range--but better. BP remains elevated

## 2012-08-16 NOTE — Patient Instructions (Signed)
Pregnancy - Third Trimester The third trimester of pregnancy (the last 3 months) is a period of the most rapid growth for you and your baby. The baby approaches a length of 20 inches and a weight of 6 to 10 pounds. The baby is adding on fat and getting ready for life outside your body. While inside, babies have periods of sleeping and waking, suck their thumbs, and hiccups. You can often feel small contractions of the uterus. This is false labor. It is also called Braxton-Hicks contractions. This is like a practice for labor. The usual problems in this stage of pregnancy include more difficulty breathing, swelling of the hands and feet from water retention, and having to urinate more often because of the uterus and baby pressing on your bladder.  PRENATAL EXAMS  Blood work may continue to be done during prenatal exams. These tests are done to check on your health and the probable health of your baby. Blood work is used to follow your blood levels (hemoglobin). Anemia (low hemoglobin) is common during pregnancy. Iron and vitamins are given to help prevent this. You may also continue to be checked for diabetes. Some of the past blood tests may be done again.  The size of the uterus is measured during each visit. This makes sure your baby is growing properly according to your pregnancy dates.  Your blood pressure is checked every prenatal visit. This is to make sure you are not getting toxemia.  Your urine is checked every prenatal visit for infection, diabetes and protein.  Your weight is checked at each visit. This is done to make sure gains are happening at the suggested rate and that you and your baby are growing normally.  Sometimes, an ultrasound is performed to confirm the position and the proper growth and development of the baby. This is a test done that bounces harmless sound waves off the baby so your caregiver can more accurately determine due dates.  Discuss the type of pain medication  and anesthesia you will have during your labor and delivery.  Discuss the possibility and anesthesia if a Cesarean Section might be necessary.  Inform your caregiver if there is any mental or physical violence at home. Sometimes, a specialized non-stress test, contraction stress test and biophysical profile are done to make sure the baby is not having a problem. Checking the amniotic fluid surrounding the baby is called an amniocentesis. The amniotic fluid is removed by sticking a needle into the belly (abdomen). This is sometimes done near the end of pregnancy if an early delivery is required. In this case, it is done to help make sure the baby's lungs are mature enough for the baby to live outside of the womb. If the lungs are not mature and it is unsafe to deliver the baby, an injection of cortisone medication is given to the mother 1 to 2 days before the delivery. This helps the baby's lungs mature and makes it safer to deliver the baby. CHANGES OCCURING IN THE THIRD TRIMESTER OF PREGNANCY Your body goes through many changes during pregnancy. They vary from person to person. Talk to your caregiver about changes you notice and are concerned about.  During the last trimester, you have probably had an increase in your appetite. It is normal to have cravings for certain foods. This varies from person to person and pregnancy to pregnancy.  You may begin to get stretch marks on your hips, abdomen, and breasts. These are normal changes in the body   during pregnancy. There are no exercises or medications to take which prevent this change.  Constipation may be treated with a stool softener or adding bulk to your diet. Drinking lots of fluids, fiber in vegetables, fruits, and whole grains are helpful.  Exercising is also helpful. If you have been very active up until your pregnancy, most of these activities can be continued during your pregnancy. If you have been less active, it is helpful to start an  exercise program such as walking. Consult your caregiver before starting exercise programs.  Avoid all smoking, alcohol, un-prescribed drugs, herbs and "street drugs" during your pregnancy. These chemicals affect the formation and growth of the baby. Avoid chemicals throughout the pregnancy to ensure the delivery of a healthy infant.  Backache, varicose veins and hemorrhoids may develop or get worse.  You will tire more easily in the third trimester, which is normal.  The baby's movements may be stronger and more often.  You may become short of breath easily.  Your belly button may stick out.  A yellow discharge may leak from your breasts called colostrum.  You may have a bloody mucus discharge. This usually occurs a few days to a week before labor begins. HOME CARE INSTRUCTIONS   Keep your caregiver's appointments. Follow your caregiver's instructions regarding medication use, exercise, and diet.  During pregnancy, you are providing food for you and your baby. Continue to eat regular, well-balanced meals. Choose foods such as meat, fish, milk and other low fat dairy products, vegetables, fruits, and whole-grain breads and cereals. Your caregiver will tell you of the ideal weight gain.  A physical sexual relationship may be continued throughout pregnancy if there are no other problems such as early (premature) leaking of amniotic fluid from the membranes, vaginal bleeding, or belly (abdominal) pain.  Exercise regularly if there are no restrictions. Check with your caregiver if you are unsure of the safety of your exercises. Greater weight gain will occur in the last 2 trimesters of pregnancy. Exercising helps:  Control your weight.  Get you in shape for labor and delivery.  You lose weight after you deliver.  Rest a lot with legs elevated, or as needed for leg cramps or low back pain.  Wear a good support or jogging bra for breast tenderness during pregnancy. This may help if worn  during sleep. Pads or tissues may be used in the bra if you are leaking colostrum.  Do not use hot tubs, steam rooms, or saunas.  Wear your seat belt when driving. This protects you and your baby if you are in an accident.  Avoid raw meat, cat litter boxes and soil used by cats. These carry germs that can cause birth defects in the baby.  It is easier to loose urine during pregnancy. Tightening up and strengthening the pelvic muscles will help with this problem. You can practice stopping your urination while you are going to the bathroom. These are the same muscles you need to strengthen. It is also the muscles you would use if you were trying to stop from passing gas. You can practice tightening these muscles up 10 times a set and repeating this about 3 times per day. Once you know what muscles to tighten up, do not perform these exercises during urination. It is more likely to cause an infection by backing up the urine.  Ask for help if you have financial, counseling or nutritional needs during pregnancy. Your caregiver will be able to offer counseling for these   needs as well as refer you for other special needs.  Make a list of emergency phone numbers and have them available.  Plan on getting help from family or friends when you go home from the hospital.  Make a trial run to the hospital.  Take prenatal classes with the father to understand, practice and ask questions about the labor and delivery.  Prepare the baby's room/nursery.  Do not travel out of the city unless it is absolutely necessary and with the advice of your caregiver.  Wear only low or no heal shoes to have better balance and prevent falling. MEDICATIONS AND DRUG USE IN PREGNANCY  Take prenatal vitamins as directed. The vitamin should contain 1 milligram of folic acid. Keep all vitamins out of reach of children. Only a couple vitamins or tablets containing iron may be fatal to a baby or young child when  ingested.  Avoid use of all medications, including herbs, over-the-counter medications, not prescribed or suggested by your caregiver. Only take over-the-counter or prescription medicines for pain, discomfort, or fever as directed by your caregiver. Do not use aspirin, ibuprofen (Motrin, Advil, Nuprin) or naproxen (Aleve) unless OK'd by your caregiver.  Let your caregiver also know about herbs you may be using.  Alcohol is related to a number of birth defects. This includes fetal alcohol syndrome. All alcohol, in any form, should be avoided completely. Smoking will cause low birth rate and premature babies.  Street/illegal drugs are very harmful to the baby. They are absolutely forbidden. A baby born to an addicted mother will be addicted at birth. The baby will go through the same withdrawal an adult does. SEEK MEDICAL CARE IF: You have any concerns or worries during your pregnancy. It is better to call with your questions if you feel they cannot wait, rather than worry about them. DECISIONS ABOUT CIRCUMCISION You may or may not know the sex of your baby. If you know your baby is a boy, it may be time to think about circumcision. Circumcision is the removal of the foreskin of the penis. This is the skin that covers the sensitive end of the penis. There is no proven medical need for this. Often this decision is made on what is popular at the time or based upon religious beliefs and social issues. You can discuss these issues with your caregiver or pediatrician. SEEK IMMEDIATE MEDICAL CARE IF:   An unexplained oral temperature above 102 F (38.9 C) develops, or as your caregiver suggests.  You have leaking of fluid from the vagina (birth canal). If leaking membranes are suspected, take your temperature and tell your caregiver of this when you call.  There is vaginal spotting, bleeding or passing clots. Tell your caregiver of the amount and how many pads are used.  You develop a bad smelling  vaginal discharge with a change in the color from clear to white.  You develop vomiting that lasts more than 24 hours.  You develop chills or fever.  You develop shortness of breath.  You develop burning on urination.  You loose more than 2 pounds of weight or gain more than 2 pounds of weight or as suggested by your caregiver.  You notice sudden swelling of your face, hands, and feet or legs.  You develop belly (abdominal) pain. Round ligament discomfort is a common non-cancerous (benign) cause of abdominal pain in pregnancy. Your caregiver still must evaluate you.  You develop a severe headache that does not go away.  You develop visual   problems, blurred or double vision.  If you have not felt your baby move for more than 1 hour. If you think the baby is not moving as much as usual, eat something with sugar in it and lie down on your left side for an hour. The baby should move at least 4 to 5 times per hour. Call right away if your baby moves less than that.  You fall, are in a car accident or any kind of trauma.  There is mental or physical violence at home. Document Released: 05/06/2001 Document Revised: 08/04/2011 Document Reviewed: 11/08/2008 [employer] Patient Information 2013 [employer].  Breastfeeding Deciding to breastfeed is one of the best choices you can make for you and your baby. The information that follows gives a brief overview of the benefits of breastfeeding as well as common topics surrounding breastfeeding. BENEFITS OF BREASTFEEDING For the baby  The first milk (colostrum) helps the baby's digestive system function better.   There are antibodies in the mother's milk that help the baby fight off infections.   The baby has a lower incidence of asthma, allergies, and sudden infant death syndrome (SIDS).   The nutrients in breast milk are better for the baby than infant formulas, and breast milk helps the baby's brain grow better.   Babies who  breastfeed have less gas, colic, and constipation.  For the mother  Breastfeeding helps develop a very special bond between the mother and her baby.   Breastfeeding is convenient, always available at the correct temperature, and costs nothing.   Breastfeeding burns calories in the mother and helps her lose weight that was gained during pregnancy.   Breastfeeding makes the uterus contract back down to normal size faster and slows bleeding following delivery.   Breastfeeding mothers have a lower risk of developing breast cancer.  BREASTFEEDING FREQUENCY  A healthy, full-term baby may breastfeed as often as every hour or space his or her feedings to every 3 hours.   Watch your baby for signs of hunger. Nurse your baby if he or she shows signs of hunger. How often you nurse will vary from baby to baby.   Nurse as often as the baby requests, or when you feel the need to reduce the fullness of your breasts.   Awaken the baby if it has been 3 4 hours since the last feeding.   Frequent feeding will help the mother make more milk and will help prevent problems, such as sore nipples and engorgement of the breasts.  BABY'S POSITION AT THE BREAST  Whether lying down or sitting, be sure that the baby's tummy is facing your tummy.   Support the breast with 4 fingers underneath the breast and the thumb above. Make sure your fingers are well away from the nipple and baby's mouth.   Stroke the baby's lips gently with your finger or nipple.   When the baby's mouth is open wide enough, place all of your nipple and as much of the areola as possible into your baby's mouth.   Pull the baby in close so the tip of the nose and the baby's cheeks touch the breast during the feeding.  FEEDINGS AND SUCTION  The length of each feeding varies from baby to baby and from feeding to feeding.   The baby must suck about 2 3 minutes for your milk to get to him or her. This is called a "let down."  For this reason, allow the baby to feed on each breast as   long as he or she wants. Your baby will end the feeding when he or she has received the right balance of nutrients.   To break the suction, put your finger into the corner of the baby's mouth and slide it between his or her gums before removing your breast from his or her mouth. This will help prevent sore nipples.  HOW TO TELL WHETHER YOUR BABY IS GETTING ENOUGH BREAST MILK. Wondering whether or not your baby is getting enough milk is a common concern among mothers. You can be assured that your baby is getting enough milk if:   Your baby is actively sucking and you hear swallowing.   Your baby seems relaxed and satisfied after a feeding.   Your baby nurses at least 8 12 times in a 24 hour time period. Nurse your baby until he or she unlatches or falls asleep at the first breast (at least 10 20 minutes), then offer the second side.   Your baby is wetting 5 6 disposable diapers (6 8 cloth diapers) in a 24 hour period by 5 6 days of age.   Your baby is having at least 3 4 stools every 24 hours for the first 6 weeks. The stool should be soft and yellow.   Your baby should gain 4 7 ounces per week after he or she is 4 days old.   Your breasts feel softer after nursing.  REDUCING BREAST ENGORGEMENT  In the first week after your baby is born, you may experience signs of breast engorgement. When breasts are engorged, they feel heavy, warm, full, and may be tender to the touch. You can reduce engorgement if you:   Nurse frequently, every 2 3 hours. Mothers who breastfeed early and often have fewer problems with engorgement.   Place light ice packs on your breasts for 10 20 minutes between feedings. This reduces swelling. Wrap the ice packs in a lightweight towel to protect your skin. Bags of frozen vegetables work well for this purpose.   Take a warm shower or apply warm, moist heat to your breast for 5 10 minutes just before  each feeding. This increases circulation and helps the milk flow.   Gently massage your breast before and during the feeding. Using your finger tips, massage from the chest wall towards your nipple in a circular motion.   Make sure that the baby empties at least one breast at every feeding before switching sides.   Use a breast pump to empty the breasts if your baby is sleepy or not nursing well. You may also want to pump if you are returning to work oryou feel you are getting engorged.   Avoid bottle feeds, pacifiers, or supplemental feedings of water or juice in place of breastfeeding. Breast milk is all the food your baby needs. It is not necessary for your baby to have water or formula. In fact, to help your breasts make more milk, it is best not to give your baby supplemental feedings during the early weeks.   Be sure the baby is latched on and positioned properly while breastfeeding.   Wear a supportive bra, avoiding underwire styles.   Eat a balanced diet with enough fluids.   Rest often, relax, and take your prenatal vitamins to prevent fatigue, stress, and anemia.  If you follow these suggestions, your engorgement should improve in 24 48 hours. If you are still experiencing difficulty, call your lactation consultant or caregiver.  CARING FOR YOURSELF Take care of your   breasts  Bathe or shower daily.   Avoid using soap on your nipples.   Start feedings on your left breast at one feeding and on your right breast at the next feeding.   You will notice an increase in your milk supply 2 5 days after delivery. You may feel some discomfort from engorgement, which makes your breasts very firm and often tender. Engorgement "peaks" out within 24 48 hours. In the meantime, apply warm moist towels to your breasts for 5 10 minutes before feeding. Gentle massage and expression of some milk before feeding will soften your breasts, making it easier for your baby to latch on.    Wear a well-fitting nursing bra, and air dry your nipples for a 3 4minutes after each feeding.   Only use cotton bra pads.   Only use pure lanolin on your nipples after nursing. You do not need to wash it off before feeding the baby again. Another option is to express a few drops of breast milk and gently massage it into your nipples.  Take care of yourself  Eat well-balanced meals and nutritious snacks.   Drinking milk, fruit juice, and water to satisfy your thirst (about 8 glasses a day).   Get plenty of rest.  Avoid foods that you notice affect the baby in a bad way.  SEEK MEDICAL CARE IF:   You have difficulty with breastfeeding and need help.   You have a hard, red, sore area on your breast that is accompanied by a fever.   Your baby is too sleepy to eat well or is having trouble sleeping.   Your baby is wetting less than 6 diapers a day, by 5 days of age.   Your baby's skin or white part of his or her eyes is more yellow than it was in the hospital.   You feel depressed.  Document Released: 05/12/2005 Document Revised: 11/11/2011 Document Reviewed: 08/10/2011 [employer] Patient Information 2013 [employer].  

## 2012-08-16 NOTE — Progress Notes (Signed)
[place] growth @ [facility] on 3/31.

## 2012-08-16 NOTE — Progress Notes (Signed)
Pulse- 94 

## 2012-08-17 NOTE — Progress Notes (Signed)
NST 08-12-12 reviewed and reactive 

## 2012-08-18 LAB — FETAL NONSTRESS TEST

## 2012-08-20 ENCOUNTER — Other Ambulatory Visit: Payer: Self-pay | Admitting: Obstetrics & Gynecology

## 2012-08-20 ENCOUNTER — Encounter (HOSPITAL_COMMUNITY): Payer: Self-pay | Admitting: *Deleted

## 2012-08-20 ENCOUNTER — Ambulatory Visit (HOSPITAL_COMMUNITY): Admission: RE | Admit: 2012-08-20 | Payer: 59 | Source: Ambulatory Visit

## 2012-08-20 ENCOUNTER — Ambulatory Visit (HOSPITAL_COMMUNITY)
Admission: RE | Admit: 2012-08-20 | Discharge: 2012-08-20 | Disposition: A | Discharge status: Home / Self Care | Payer: 59 | Source: Ambulatory Visit | Attending: Family Medicine | Admitting: Family Medicine

## 2012-08-20 ENCOUNTER — Inpatient Hospital Stay (HOSPITAL_COMMUNITY)
Admission: AD | Admit: 2012-08-20 | Discharge: 2012-08-20 | Disposition: A | Discharge status: Home / Self Care | Payer: 59 | Source: Ambulatory Visit | Attending: Obstetrics & Gynecology | Admitting: Obstetrics & Gynecology

## 2012-08-20 ENCOUNTER — Other Ambulatory Visit (HOSPITAL_COMMUNITY): Payer: Self-pay | Admitting: Maternal and Fetal Medicine

## 2012-08-20 DIAGNOSIS — O09529 Supervision of elderly multigravida, unspecified trimester: Secondary | ICD-10-CM | POA: Insufficient documentation

## 2012-08-20 DIAGNOSIS — O133 Gestational [pregnancy-induced] hypertension without significant proteinuria, third trimester: Secondary | ICD-10-CM

## 2012-08-20 DIAGNOSIS — E139 Other specified diabetes mellitus without complications: Secondary | ICD-10-CM

## 2012-08-20 DIAGNOSIS — O99840 Bariatric surgery status complicating pregnancy, unspecified trimester: Secondary | ICD-10-CM | POA: Insufficient documentation

## 2012-08-20 DIAGNOSIS — O24919 Unspecified diabetes mellitus in pregnancy, unspecified trimester: Secondary | ICD-10-CM | POA: Insufficient documentation

## 2012-08-20 DIAGNOSIS — O139 Gestational [pregnancy-induced] hypertension without significant proteinuria, unspecified trimester: Secondary | ICD-10-CM | POA: Insufficient documentation

## 2012-08-20 LAB — COMPREHENSIVE METABOLIC PANEL
AST: 21 U/L (ref 0–37)
Albumin: 2.6 g/dL — ABNORMAL LOW (ref 3.5–5.2)
Alkaline Phosphatase: 120 U/L — ABNORMAL HIGH (ref 39–117)
BUN: 7 mg/dL (ref 6–23)
CO2: 19 mEq/L (ref 19–32)
Chloride: 105 mEq/L (ref 96–112)
GFR calc non Af Amer: >90 mL/min (ref >90)
Potassium: 3.6 mEq/L (ref 3.5–5.1)
Total Bilirubin: 0.3 mg/dL (ref 0.3–1.2)

## 2012-08-20 LAB — CBC
HCT: 36.4 % (ref 36.0–46.0)
Platelets: 131 10*3/uL — ABNORMAL LOW (ref 150–400)
RBC: 4.31 MIL/uL (ref 3.87–5.11)
RDW: 13.4 % (ref 11.5–15.5)
WBC: 11.8 10*3/uL — ABNORMAL HIGH (ref 4.0–10.5)

## 2012-08-20 MED ORDER — LABETALOL HCL 100 MG PO TABS
200.0000 mg | ORAL_TABLET | Freq: Once | ORAL | Status: AC
  Administered 2012-08-20: 200 mg via ORAL
  Filled 2012-08-20: qty 2

## 2012-08-20 MED ORDER — LABETALOL HCL 100 MG PO TABS: 200.0000 mg | ORAL_TABLET | Freq: Two times a day (BID) | ORAL | Status: DC

## 2012-08-20 NOTE — MAU Note (Signed)
Sent from [facility] for [facility] eval.  BP's have been running high.  No HA, visual changes or epigastric pain.

## 2012-08-20 NOTE — MAU Note (Signed)
Pt presents from clinic for [facility] evaluation.

## 2012-08-20 NOTE — MAU Provider Note (Signed)
History     CSN: [id number]  Arrival date and time: 08/20/12 1545   None     Chief Complaint  Patient presents with  . PIH eval    HPI This is a 37 y.o. female at [redacted]w[redacted]d who presents from [facility] for evaluation of PIH,  States BPs have been elevated and denies headache, abdominal pain or visual changes. Has some swelling.   RN Note: Sent from [facility] for PIH eval. BP's have been running high. No HA, visual changes or epigastric pain  OB History   Grav Para Term Preterm Abortions TAB SAB Ect Mult Living   1               Past Medical History  Diagnosis Date  . Diabetes mellitus AGE 47    DR [facility]  . Infertility   . Acne   . LGSIL (low grade squamous intraepithelial dysplasia) 2008  . Sickle cell trait   . Heart murmur   . Hypertension     past h/o elevated BP with taking Tylenol Sinus    Past Surgical History  Procedure Laterality Date  . Exploratory laparoscopy      DUE TO RLQ PAIN  . Gastric bypass  nov. 2011    [facility] [place]    Family History  Problem Relation Age of Onset  . Diabetes Father   . Hypertension Father   . Cancer Father     prostate and mouth  . Sickle cell anemia Mother   . Anemia Mother   . Sickle cell trait Sister   . Hypertension Sister     History  Substance Use Topics  . Smoking status: Never Smoker   . Smokeless tobacco: Never Used  . Alcohol Use: No    Allergies:  Allergies  Allergen Reactions  . Bactrim     hives  . Doxycycline     Hives   . Levofloxacin Itching and Other (See Comments)    Reaction unknown    Prescriptions prior to admission  Medication Sig Dispense Refill  . ampicillin (PRINCIPEN) 250 MG capsule Take 500 mg by mouth daily.       . Azelaic Acid (FINACEA) 15 % cream Apply 1 application topically 2 (two) times daily. After skin is thoroughly washed and patted dry, gently but thoroughly massage a thin film of azelaic acid cream into the affected area twice daily, in the morning and evening.      . cetirizine  (ZYRTEC) 10 MG tablet Take 10 mg by mouth daily as needed for allergies.       . Cholecalciferol (VITAMIN D) 2000 UNITS CAPS Take 2,000 Units by mouth daily.      . ferrous sulfate 325 (65 FE) MG tablet Take 325 mg by mouth daily.      . insulin aspart (NOVOLOG) 100 UNIT/ML injection Inject 12-20 Units into the skin 2 (two) times daily. Inject 12 units before breakfast and lunch; 20 units before dinner      . insulin NPH (HUMULIN N,NOVOLIN N) 100 UNIT/ML injection Inject 10-12 Units into the skin 2 (two) times daily. Inject 12 units into the skin @ breakfast and 10 units @ bedtime      . Prenatal Vit-Fe Fumarate-FA (PRENATAL MULTIVITAMIN) TABS Take 1 tablet by mouth daily.      . [DISCONTINUED] insulin aspart (NOVOLOG) 100 UNIT/ML injection Inject 12 units before breakfast and lunch; 20 units before dinner  5 vial  2  . [DISCONTINUED] insulin NPH (HUMULIN N,NOVOLIN N) 100  UNIT/ML injection Inject 12 units into the skin @ breakfast and 10 units @ bedtime  3 vial  2  . ACCU-CHEK FASTCLIX LANCETS MISC 1 Units by Percutaneous route 4 (four) times daily.  100 each  12  . glucose blood (ACCU-CHEK SMARTVIEW) test strip Check blood sugars 4x/daily  100 each  12  . Insulin Syringes, Disposable, U-100 0.5 ML MISC Use 4 times daily as directed  100 each  3    Review of Systems  Constitutional: Negative for fever, chills and malaise/fatigue.  Eyes: Negative for blurred vision and double vision.  Cardiovascular: Negative for chest pain.  Gastrointestinal: Negative for nausea, vomiting, abdominal pain, diarrhea and constipation.  Genitourinary: Negative for dysuria.  Neurological: Negative for dizziness, sensory change, weakness and headaches.   Physical Exam   Blood pressure 154/91, pulse 96, temperature 98.2 F (36.8 C), temperature source Oral, resp. rate 18, last menstrual period 12/11/2011. Filed Vitals:   08/20/12 1810 08/20/12 1825 08/20/12 1850 08/20/12 1936  BP: 160/82 160/83 157/75 157/75   Pulse: 98 88 86 86  Temp:      TempSrc:      Resp:    18    Physical Exam  Constitutional: She is oriented to person, place, and time. She appears well-developed and well-nourished. No distress.  HENT:  Head: Normocephalic.  Cardiovascular: Normal rate.   Respiratory: Effort normal.  GI: Soft. She exhibits no distension and no mass. There is no tenderness. There is no rebound and no guarding.  Genitourinary:  FHR reactive Occasional contractions   Musculoskeletal: Normal range of motion. She exhibits edema (1+ pedal).  Neurological: She is alert and oriented to person, place, and time. She has normal reflexes.  Skin: Skin is warm and dry.  Psychiatric: She has a normal mood and affect.    MAU Course  Procedures Results for orders placed during the hospital encounter of 08/20/12 (from the past 72 hour(s))  PROTEIN / CREATININE RATIO, URINE     Status: None   Collection Time    08/20/12  4:16 PM      Result Value Range   Creatinine, Urine 56.71     Total Protein, Urine 8.1     Comment: NO NORMAL RANGE ESTABLISHED FOR THIS TEST   PROTEIN CREATININE RATIO 0.14  0.00 - 0.15  CBC     Status: Abnormal   Collection Time    08/20/12  5:00 PM      Result Value Range   WBC 11.8 (*) 4.0 - 10.5 K/uL   RBC 4.31  3.87 - 5.11 MIL/uL   Hemoglobin 13.0  12.0 - 15.0 g/dL   HCT 16.1  09.6 - 04.5 %   MCV 84.5  78.0 - 100.0 fL   MCH 30.2  26.0 - 34.0 pg   MCHC 35.7  30.0 - 36.0 g/dL   RDW 40.9  81.1 - 91.4 %   Platelets 131 (*) 150 - 400 K/uL  COMPREHENSIVE METABOLIC PANEL     Status: Abnormal   Collection Time    08/20/12  5:00 PM      Result Value Range   Sodium 137  135 - 145 mEq/L   Potassium 3.6  3.5 - 5.1 mEq/L   Chloride 105  96 - 112 mEq/L   CO2 19  19 - 32 mEq/L   Glucose, Bld 92  70 - 99 mg/dL   BUN 7  6 - 23 mg/dL   Creatinine, Ser 7.82  0.50 - 1.10 mg/dL  Calcium 8.7  8.4 - 10.5 mg/dL   Total Protein 5.7 (*) 6.0 - 8.3 g/dL   Albumin 2.6 (*) 3.5 - 5.2 g/dL   AST  21  0 - 37 U/L   ALT 12  0 - 35 U/L   Alkaline Phosphatase 120 (*) 39 - 117 U/L   Total Bilirubin 0.3  0.3 - 1.2 mg/dL   GFR calc non Af Amer >90  >90 mL/min   GFR calc Af Amer >90  >90 mL/min   Comment:            The eGFR has been calculated     using the CKD EPI equation.     This calculation has not been     validated in all clinical     situations.     eGFR's persistently     <90 mL/min signify     possible Chronic Kidney Disease.     Assessment and Plan  A:  [facility] at [redacted]w[redacted]d      Gestational vs chronic hypertension       No evidence for preeclampsia  P:  Discharge home       Will start on Labetalol       Followup in clinic as scheduled   Medication List    TAKE these medications       ACCU-CHEK FASTCLIX LANCETS Misc  1 Units by Percutaneous route 4 (four) times daily.     ampicillin 250 MG capsule  Commonly known as:  PRINCIPEN  Take 500 mg by mouth daily.     cetirizine 10 MG tablet  Commonly known as:  ZYRTEC  Take 10 mg by mouth daily as needed for allergies.     ferrous sulfate 325 (65 FE) MG tablet  Take 325 mg by mouth daily.     FINACEA 15 % cream  Generic drug:  Azelaic Acid  Apply 1 application topically 2 (two) times daily. After skin is thoroughly washed and patted dry, gently but thoroughly massage a thin film of azelaic acid cream into the affected area twice daily, in the morning and evening.     glucose blood test strip  Commonly known as:  ACCU-CHEK SMARTVIEW  Check blood sugars 4x/daily     insulin aspart 100 UNIT/ML injection  Commonly known as:  novoLOG  Inject 12-20 Units into the skin 2 (two) times daily. Inject 12 units before breakfast and lunch; 20 units before dinner     insulin NPH 100 UNIT/ML injection  Commonly known as:  HUMULIN N,NOVOLIN N  Inject 10-12 Units into the skin 2 (two) times daily. Inject 12 units into the skin @ breakfast and 10 units @ bedtime     Insulin Syringes (Disposable) U-100 0.5 ML Misc  Use 4 times  daily as directed     labetalol 100 MG tablet  Commonly known as:  NORMODYNE  Take 2 tablets (200 mg total) by mouth 2 (two) times daily.     prenatal multivitamin Tabs  Take 1 tablet by mouth daily.     Vitamin D 2000 UNITS Caps  Take 2,000 Units by mouth daily.         [name] 08/20/2012, 6:05 PM

## 2012-08-20 NOTE — Progress Notes (Signed)
Melissa Braun was seen for ultrasound appointment today.  Please see [facility] report for details.

## 2012-08-23 ENCOUNTER — Other Ambulatory Visit: Payer: Self-pay | Admitting: Family Medicine

## 2012-08-23 ENCOUNTER — Ambulatory Visit (HOSPITAL_COMMUNITY)
Admission: RE | Admit: 2012-08-23 | Discharge: 2012-08-23 | Disposition: A | Discharge status: Home / Self Care | Payer: 59 | Source: Ambulatory Visit | Attending: Family Medicine | Admitting: Family Medicine

## 2012-08-23 ENCOUNTER — Ambulatory Visit (INDEPENDENT_AMBULATORY_CARE_PROVIDER_SITE_OTHER): Payer: 59 | Admitting: Family Medicine

## 2012-08-23 VITALS — BP 150/88 | Wt 218.9 lb

## 2012-08-23 DIAGNOSIS — O10013 Pre-existing essential hypertension complicating pregnancy, third trimester: Secondary | ICD-10-CM

## 2012-08-23 DIAGNOSIS — O139 Gestational [pregnancy-induced] hypertension without significant proteinuria, unspecified trimester: Secondary | ICD-10-CM | POA: Insufficient documentation

## 2012-08-23 DIAGNOSIS — O24919 Unspecified diabetes mellitus in pregnancy, unspecified trimester: Secondary | ICD-10-CM

## 2012-08-23 DIAGNOSIS — O0990 Supervision of high risk pregnancy, unspecified, unspecified trimester: Secondary | ICD-10-CM

## 2012-08-23 DIAGNOSIS — O24913 Unspecified diabetes mellitus in pregnancy, third trimester: Secondary | ICD-10-CM

## 2012-08-23 DIAGNOSIS — O10019 Pre-existing essential hypertension complicating pregnancy, unspecified trimester: Secondary | ICD-10-CM

## 2012-08-23 DIAGNOSIS — O09529 Supervision of elderly multigravida, unspecified trimester: Secondary | ICD-10-CM | POA: Insufficient documentation

## 2012-08-23 LAB — POCT URINALYSIS DIP (DEVICE)
Bilirubin Urine: NEGATIVE
Hgb urine dipstick: NEGATIVE
Ketones, ur: NEGATIVE mg/dL
pH: 6.5 (ref 5.0–8.0)

## 2012-08-23 MED ORDER — INSULIN ASPART 100 UNIT/ML ~~LOC~~ SOLN: 12.0000 [IU] | Freq: Two times a day (BID) | SUBCUTANEOUS | Status: DC

## 2012-08-23 MED ORDER — INSULIN NPH (HUMAN) (ISOPHANE) 100 UNIT/ML ~~LOC~~ SUSP: 10.0000 [IU] | Freq: Two times a day (BID) | SUBCUTANEOUS | Status: DC

## 2012-08-23 NOTE — Patient Instructions (Addendum)
Novolog Inject 12 units before breakfast and 14 units before lunch; 22 units before dinner NPH 14 units in the am, 10 units at bedtime Gestational Diabetes Mellitus Gestational diabetes mellitus (GDM) is diabetes that occurs only during pregnancy. This happens when the body cannot properly handle the glucose (sugar) that increases in the blood after eating. During pregnancy, insulin resistance (reduced sensitivity to insulin) occurs because of the release of hormones from the placenta. Usually, the pancreas of pregnant women produces enough insulin to overcome the resistance that occurs. However, in gestational diabetes, the insulin is there but it does not work effectively. If the resistance is severe enough that the pancreas does not produce enough insulin, extra glucose builds up in the blood.  WHO IS AT RISK FOR DEVELOPING GESTATIONAL DIABETES?  Women with a history of diabetes in the family.  Women over age 83.  Women who are overweight.  Women in certain ethnic groups (Hispanic, African American, Native American, [place] and [place] Islander). WHAT CAN HAPPEN TO THE BABY? If the mother's blood glucose is too high while she is pregnant, the extra sugar will travel through the umbilical cord to the baby. Some of the problems the baby may have are:  Large Baby - If the baby receives too much sugar, the baby will gain more weight. This may cause the baby to be too large to be born normally (vaginally) and a Cesarean section (C-section) may be needed.  Low Blood Glucose (hypoglycemia)  The baby makes extra insulin, in response to the extra sugar its gets from its mother. When the baby is born and no longer needs this extra insulin, the baby's blood glucose level may drop.  Jaundice (yellow coloring of the skin and eyes)  This is fairly common in babies. It is caused from a build-up of the chemical called bilirubin. This is rarely serious, but is seen more often in babies whose mothers had  gestational diabetes. RISKS TO THE MOTHER Women who have had gestational diabetes may be at higher risk for some problems, including:  Preeclampsia or toxemia, which includes problems with high blood pressure. Blood pressure and protein levels in the urine must be checked frequently.  Infections.  Cesarean section (C-section) for delivery.  Developing Type 2 diabetes later in life. About 30-50% will develop diabetes later, especially if obese. DIAGNOSIS  The hormones that cause insulin resistance are highest at about 24-28 weeks of pregnancy. If symptoms are experienced, they are much like symptoms you would normally expect during pregnancy.  GDM is often diagnosed using a two part method: 1. After 24-28 weeks of pregnancy, the woman drinks a glucose solution and takes a blood test. If the glucose level is high, a second test will be given. 2. Oral Glucose Tolerance Test (OGTT) which is 3 hours long  After not eating overnight, the blood glucose is checked. The woman drinks a glucose solution, and hourly blood glucose tests are taken. If the woman has risk factors for GDM, the caregiver may test earlier than 24 weeks of pregnancy. TREATMENT  Treatment of GDM is directed at keeping the mother's blood glucose level normal, and may include:  Meal planning.  Taking insulin or other medicine to control your blood glucose level.  Exercise.  Keeping a daily record of the foods you eat.  Blood glucose monitoring and keeping a record of your blood glucose levels.  May monitor ketone levels in the urine, although this is no longer considered necessary in most pregnancies. HOME CARE INSTRUCTIONS  While you are pregnant:  Follow your caregiver's advice regarding your prenatal appointments, meal planning, exercise, medicines, vitamins, blood and other tests, and physical activities.  Keep a record of your meals, blood glucose tests, and the amount of insulin you are taking (if any). Show this  to your caregiver at every prenatal visit.  If you have GDM, you may have problems with hypoglycemia (low blood glucose). You may suspect this if you become suddenly dizzy, feel shaky, and/or weak. If you think this is happening and you have a glucose meter, try to test your blood glucose level. Follow your caregiver's advice for when and how to treat your low blood glucose. Generally, the 15:15 rule is followed: Treat by consuming 15 grams of carbohydrates, wait 15 minutes, and recheck blood glucose. Examples of 15 grams of carbohydrates are:  1 cup skim or low-fat milk.   cup juice.  3-4 glucose tablets.  5-6 hard candies.  1 small box raisins.   cup regular soda pop.  Practice good hygiene, to avoid infections.  Do not smoke. SEEK MEDICAL CARE IF:   You develop abnormal vaginal discharge, with or without itching.  You become weak and tired more than expected.  You seem to sweat a lot.  You have a sudden increase in weight, 5 pounds or more in one week.  You are losing weight, 3 pounds or more in a week.  Your blood glucose level is high, and you need instructions on what to do about it. SEEK IMMEDIATE MEDICAL CARE IF:   You develop a severe headache.  You faint or pass out.  You develop nausea and vomiting.  You become disoriented or confused.  You have a convulsion.  You develop vision problems.  You develop stomach pain.  You develop vaginal bleeding.  You develop uterine contractions.  You have leaking or a gush of fluid from the vagina. AFTER YOU HAVE THE BABY:  Go to all of your follow-up appointments, and have blood tests as advised by your caregiver.  Maintain a healthy lifestyle, to prevent diabetes in the future. This includes:  Following a healthy meal plan.  Controlling your weight.  Getting enough exercise and proper rest.  Do not smoke.  Breastfeed your baby if you can. This will lower the chance of you and your baby developing  diabetes later in life. For more information about diabetes, go to the American Diabetes Association at: PMFashions.com.cy. For more information about gestational diabetes, go to the [employer] at: RentRule.com.au. Document Released: 08/18/2000 Document Revised: 08/04/2011 Document Reviewed: 03/12/2009 [facility] Patient Information 2013 [place], [place].  Preeclampsia and Eclampsia Preeclampsia is a condition of high blood pressure during pregnancy. It can happen at 20 weeks or later in pregnancy. If high blood pressure occurs in the second half of pregnancy with no other symptoms, it is called gestational hypertension and goes away after the baby is born. If any of the symptoms listed below develop with gestational hypertension, it is then called preeclampsia. Eclampsia (convulsions) may follow preeclampsia. This is one of the reasons for regular prenatal checkups. Early diagnosis and treatment are very important to prevent eclampsia. CAUSES  There is no known cause of preeclampsia/eclampsia in pregnancy. There are several known conditions that may put the pregnant woman at risk, such as:  The first pregnancy.  Having preeclampsia in a past pregnancy.  Having lasting (chronic) high blood pressure.  Having multiples (twins, triplets).  Being age 83 or older.  African American  ethnic background.  Having kidney disease or diabetes.  Medical conditions such as lupus or blood diseases.  Being overweight (obese). SYMPTOMS   High blood pressure.  Headaches.  Sudden weight gain.  Swelling of hands, face, legs, and feet.  Protein in the urine.  Feeling sick to your stomach (nauseous) and throwing up (vomiting).  Vision problems (blurred or double vision).  Numbness in the face, arms, legs, and feet.  Dizziness.  Slurred speech.  Preeclampsia can cause growth retardation in the fetus.  Separation (abruption) of the  placenta.  Not enough fluid in the amniotic sac (oligohydramnios).  Sensitivity to bright lights.  Belly (abdominal) pain. DIAGNOSIS  If protein is found in the urine in the second half of pregnancy, this is considered preeclampsia. Other symptoms mentioned above may also be present. TREATMENT  It is necessary to treat this.  Your caregiver may prescribe bed rest early in this condition. Plenty of rest and salt restriction may be all that is needed.  Medicines may be necessary to lower blood pressure if the condition does not respond to more conservative measures.  In more severe cases, hospitalization may be needed:  For treatment of blood pressure.  To control fluid retention.  To monitor the baby to see if the condition is causing harm to the baby.  Hospitalization is the best way to treat the first sign of preeclampsia. This is so the mother and baby can be watched closely and blood tests can be done effectively and correctly.  If the condition becomes severe, it may be necessary to induce labor or to remove the infant by surgical means (cesarean section). The best cure for preeclampsia/eclampsia is to deliver the baby. Preeclampsia and eclampsia involve risks to mother and infant. Your caregiver will discuss these risks with you. Together, you can work out the best possible approach to your problems. Make sure you keep your prenatal visits as scheduled. Not keeping appointments could result in a chronic or permanent injury, pain, disability to you, and death or injury to you or your unborn baby. If there is any problem keeping the appointment, you must call to reschedule. HOME CARE INSTRUCTIONS   Keep your prenatal appointments and tests as scheduled.  Tell your caregiver if you have any of the above risk factors.  Get plenty of rest and sleep.  Eat a balanced diet that is low in salt, and do not add salt to your food.  Avoid stressful situations.  Only take  over-the-counter and prescriptions medicines for pain, discomfort, or fever as directed by your caregiver. SEEK IMMEDIATE MEDICAL CARE IF:   You develop severe swelling anywhere in the body. This usually occurs in the legs.  You gain 5 lb/2.3 kg or more in a week.  You develop a severe headache, dizziness, problems with your vision, or confusion.  You have abdominal pain, nausea, or vomiting.  You have a seizure.  You have trouble moving any part of your body, or you develop numbness or problems speaking.  You have bruising or abnormal bleeding from anywhere in the body.  You develop a stiff neck.  You pass out. MAKE SURE YOU:   Understand these instructions.  Will watch your condition.  Will get help right away if you are not doing well or get worse. Document Released: 05/09/2000 Document Revised: 08/04/2011 Document Reviewed: 12/24/2007 [facility] Patient Information 2013 [place], [place].  Breastfeeding Deciding to breastfeed is one of the best choices you can make for you and your baby. The  information that follows gives a brief overview of the benefits of breastfeeding as well as common topics surrounding breastfeeding. BENEFITS OF BREASTFEEDING For the baby  The first milk (colostrum) helps the baby's digestive system function better.   There are antibodies in the mother's milk that help the baby fight off infections.   The baby has a lower incidence of asthma, allergies, and sudden infant death syndrome (SIDS).   The nutrients in breast milk are better for the baby than infant formulas, and breast milk helps the baby's brain grow better.   Babies who breastfeed have less gas, colic, and constipation.  For the mother  Breastfeeding helps develop a very special bond between the mother and her baby.   Breastfeeding is convenient, always available at the correct temperature, and costs nothing.   Breastfeeding burns calories in the mother and helps her lose  weight that was gained during pregnancy.   Breastfeeding makes the uterus contract back down to normal size faster and slows bleeding following delivery.   Breastfeeding mothers have a lower risk of developing breast cancer.  BREASTFEEDING FREQUENCY  A healthy, full-term baby may breastfeed as often as every hour or space his or her feedings to every 3 hours.   Watch your baby for signs of hunger. Nurse your baby if he or she shows signs of hunger. How often you nurse will vary from baby to baby.   Nurse as often as the baby requests, or when you feel the need to reduce the fullness of your breasts.   Awaken the baby if it has been 3 4 hours since the last feeding.   Frequent feeding will help the mother make more milk and will help prevent problems, such as sore nipples and engorgement of the breasts.  BABY'S POSITION AT THE BREAST  Whether lying down or sitting, be sure that the baby's tummy is facing your tummy.   Support the breast with 4 fingers underneath the breast and the thumb above. Make sure your fingers are well away from the nipple and baby's mouth.   Stroke the baby's lips gently with your finger or nipple.   When the baby's mouth is open wide enough, place all of your nipple and as much of the areola as possible into your baby's mouth.   Pull the baby in close so the tip of the nose and the baby's cheeks touch the breast during the feeding.  FEEDINGS AND SUCTION  The length of each feeding varies from baby to baby and from feeding to feeding.   The baby must suck about 2 3 minutes for your milk to get to him or her. This is called a "let down." For this reason, allow the baby to feed on each breast as long as he or she wants. Your baby will end the feeding when he or she has received the right balance of nutrients.   To break the suction, put your finger into the corner of the baby's mouth and slide it between his or her gums before removing your breast  from his or her mouth. This will help prevent sore nipples.  HOW TO TELL WHETHER YOUR BABY IS GETTING ENOUGH BREAST MILK. Wondering whether or not your baby is getting enough milk is a common concern among mothers. You can be assured that your baby is getting enough milk if:   Your baby is actively sucking and you hear swallowing.   Your baby seems relaxed and satisfied after a feeding.  Your baby nurses at least 8 12 times in a 24 hour time period. Nurse your baby until he or she unlatches or falls asleep at the first breast (at least 10 20 minutes), then offer the second side.   Your baby is wetting 5 6 disposable diapers (6 8 cloth diapers) in a 24 hour period by 37 27 days of age.   Your baby is having at least 3 4 stools every 24 hours for the first 6 weeks. The stool should be soft and yellow.   Your baby should gain 4 7 ounces per week after he or she is 34 days old.   Your breasts feel softer after nursing.  REDUCING BREAST ENGORGEMENT  In the first week after your baby is born, you may experience signs of breast engorgement. When breasts are engorged, they feel heavy, warm, full, and may be tender to the touch. You can reduce engorgement if you:   Nurse frequently, every 2 3 hours. Mothers who breastfeed early and often have fewer problems with engorgement.   Place light ice packs on your breasts for 10 20 minutes between feedings. This reduces swelling. Wrap the ice packs in a lightweight towel to protect your skin. Bags of frozen vegetables work well for this purpose.   Take a warm shower or apply warm, moist heat to your breast for 5 10 minutes just before each feeding. This increases circulation and helps the milk flow.   Gently massage your breast before and during the feeding. Using your finger tips, massage from the chest wall towards your nipple in a circular motion.   Make sure that the baby empties at least one breast at every feeding before switching sides.    Use a breast pump to empty the breasts if your baby is sleepy or not nursing well. You may also want to pump if you are returning to work oryou feel you are getting engorged.   Avoid bottle feeds, pacifiers, or supplemental feedings of water or juice in place of breastfeeding. Breast milk is all the food your baby needs. It is not necessary for your baby to have water or formula. In fact, to help your breasts make more milk, it is best not to give your baby supplemental feedings during the early weeks.   Be sure the baby is latched on and positioned properly while breastfeeding.   Wear a supportive bra, avoiding underwire styles.   Eat a balanced diet with enough fluids.   Rest often, relax, and take your prenatal vitamins to prevent fatigue, stress, and anemia.  If you follow these suggestions, your engorgement should improve in 24 48 hours. If you are still experiencing difficulty, call your lactation consultant or caregiver.  CARING FOR YOURSELF Take care of your breasts  Bathe or shower daily.   Avoid using soap on your nipples.   Start feedings on your left breast at one feeding and on your right breast at the next feeding.   You will notice an increase in your milk supply 2 5 days after delivery. You may feel some discomfort from engorgement, which makes your breasts very firm and often tender. Engorgement "peaks" out within 24 48 hours. In the meantime, apply warm moist towels to your breasts for 5 10 minutes before feeding. Gentle massage and expression of some milk before feeding will soften your breasts, making it easier for your baby to latch on.   Wear a well-fitting nursing bra, and air dry your nipples for a 3  after each feeding.   Only use cotton bra pads.   Only use pure lanolin on your nipples after nursing. You do not need to wash it off before feeding the baby again. Another option is to express a few drops of breast milk and gently massage it  into your nipples.  Take care of yourself  Eat well-balanced meals and nutritious snacks.   Drinking milk, fruit juice, and water to satisfy your thirst (about 8 glasses a day).   Get plenty of rest.  Avoid foods that you notice affect the baby in a bad way.  SEEK MEDICAL CARE IF:   You have difficulty with breastfeeding and need help.   You have a hard, red, sore area on your breast that is accompanied by a fever.   Your baby is too sleepy to eat well or is having trouble sleeping.   Your baby is wetting less than 6 diapers a day, by 80 days of age.   Your baby's skin or white part of his or her eyes is more yellow than it was in the hospital.   You feel depressed.  Document Released: 05/12/2005 Document Revised: 11/11/2011 Document Reviewed: 08/10/2011 [facility] Patient Information 2013 [place], [place].

## 2012-08-23 NOTE — Progress Notes (Signed)
P = 84    9 lb weight gain in 1 week.  [place] growth done 3/28.  Labetalol started on 3/28.

## 2012-08-23 NOTE — Progress Notes (Signed)
NST reviewed and reactive. Started on Labetalol on Friday and FHR has dropped from 140-->105 ? Related, will check BPP.  She reports normal fetal movement.  9# weight gain.  Last U/S with MFM and labs were all WNL. Cultures today. FBS 72-88 2 hour pp 105-140--Will adjust insulin Care discussed with MFM who will work pt. In today.

## 2012-08-24 ENCOUNTER — Other Ambulatory Visit: Payer: Self-pay | Admitting: Family Medicine

## 2012-08-26 ENCOUNTER — Ambulatory Visit (INDEPENDENT_AMBULATORY_CARE_PROVIDER_SITE_OTHER): Payer: 59 | Admitting: *Deleted

## 2012-08-26 VITALS — BP 167/88

## 2012-08-26 DIAGNOSIS — O10019 Pre-existing essential hypertension complicating pregnancy, unspecified trimester: Secondary | ICD-10-CM

## 2012-08-26 DIAGNOSIS — O10013 Pre-existing essential hypertension complicating pregnancy, third trimester: Secondary | ICD-10-CM

## 2012-08-26 DIAGNOSIS — O09529 Supervision of elderly multigravida, unspecified trimester: Secondary | ICD-10-CM

## 2012-08-26 DIAGNOSIS — O09523 Supervision of elderly multigravida, third trimester: Secondary | ICD-10-CM

## 2012-08-26 DIAGNOSIS — O24919 Unspecified diabetes mellitus in pregnancy, unspecified trimester: Secondary | ICD-10-CM

## 2012-08-26 DIAGNOSIS — O24913 Unspecified diabetes mellitus in pregnancy, third trimester: Secondary | ICD-10-CM

## 2012-08-26 MED ORDER — LABETALOL HCL 200 MG PO TABS: ORAL_TABLET | ORAL | Status: DC

## 2012-08-26 NOTE — Progress Notes (Signed)
NST reviewed and reactive.  Vinod Mikesell L. Harraway-Smith, M.D., FACOG    

## 2012-08-26 NOTE — Progress Notes (Signed)
NST reviewed and reactive.  Jop[place] Canty L. Harraway-Smith, M.D., FACOG    

## 2012-08-26 NOTE — Progress Notes (Signed)
P = 87   Pt denies H/A or visual disturbances.  Consult w/Dr. Erin Fulling re: BP readings. She recommended to increase Labetalol to 400 mg po BID and have pt bring in 24 hr urine on 4/7. CBC & Cmet on 4/7. Pt advised of plan of care- agreed and stated understanding.

## 2012-08-30 ENCOUNTER — Telehealth (HOSPITAL_COMMUNITY): Payer: Self-pay | Admitting: *Deleted

## 2012-08-30 ENCOUNTER — Other Ambulatory Visit: Payer: Self-pay | Admitting: Obstetrics & Gynecology

## 2012-08-30 ENCOUNTER — Ambulatory Visit (INDEPENDENT_AMBULATORY_CARE_PROVIDER_SITE_OTHER): Payer: 59 | Admitting: Family Medicine

## 2012-08-30 VITALS — BP 167/90 | Wt 230.2 lb

## 2012-08-30 DIAGNOSIS — O10013 Pre-existing essential hypertension complicating pregnancy, third trimester: Secondary | ICD-10-CM

## 2012-08-30 DIAGNOSIS — O10019 Pre-existing essential hypertension complicating pregnancy, unspecified trimester: Secondary | ICD-10-CM

## 2012-08-30 DIAGNOSIS — O24913 Unspecified diabetes mellitus in pregnancy, third trimester: Secondary | ICD-10-CM

## 2012-08-30 DIAGNOSIS — O24919 Unspecified diabetes mellitus in pregnancy, unspecified trimester: Secondary | ICD-10-CM

## 2012-08-30 LAB — POCT URINALYSIS DIP (DEVICE)
Ketones, ur: NEGATIVE mg/dL
Protein, ur: NEGATIVE mg/dL
Specific Gravity, Urine: 1.01 (ref 1.005–1.030)
pH: 6 (ref 5.0–8.0)

## 2012-08-30 NOTE — Patient Instructions (Signed)
Gestational Diabetes Mellitus Gestational diabetes mellitus (GDM) is diabetes that occurs only during pregnancy. This happens when the body cannot properly handle the glucose (sugar) that increases in the blood after eating. During pregnancy, insulin resistance (reduced sensitivity to insulin) occurs because of the release of hormones from the placenta. Usually, the pancreas of pregnant women produces enough insulin to overcome the resistance that occurs. However, in gestational diabetes, the insulin is there but it does not work effectively. If the resistance is severe enough that the pancreas does not produce enough insulin, extra glucose builds up in the blood.  WHO IS AT RISK FOR DEVELOPING GESTATIONAL DIABETES?  Women with a history of diabetes in the family.  Women over age 25.  Women who are overweight.  Women in certain ethnic groups (Hispanic, African American, Native American, Asian and Pacific Islander). WHAT CAN HAPPEN TO THE BABY? If the mother's blood glucose is too high while she is pregnant, the extra sugar will travel through the umbilical cord to the baby. Some of the problems the baby may have are:  Large Baby - If the baby receives too much sugar, the baby will gain more weight. This may cause the baby to be too large to be born normally (vaginally) and a Cesarean section (C-section) may be needed.  Low Blood Glucose (hypoglycemia)  The baby makes extra insulin, in response to the extra sugar its gets from its mother. When the baby is born and no longer needs this extra insulin, the baby's blood glucose level may drop.  Jaundice (yellow coloring of the skin and eyes)  This is fairly common in babies. It is caused from a build-up of the chemical called bilirubin. This is rarely serious, but is seen more often in babies whose mothers had gestational diabetes. RISKS TO THE MOTHER Women who have had gestational diabetes may be at higher risk for some problems,  including:  Preeclampsia or toxemia, which includes problems with high blood pressure. Blood pressure and protein levels in the urine must be checked frequently.  Infections.  Cesarean section (C-section) for delivery.  Developing Type 2 diabetes later in life. About 30-50% will develop diabetes later, especially if obese. DIAGNOSIS  The hormones that cause insulin resistance are highest at about 24-28 weeks of pregnancy. If symptoms are experienced, they are much like symptoms you would normally expect during pregnancy.  GDM is often diagnosed using a two part method: 1. After 24-28 weeks of pregnancy, the woman drinks a glucose solution and takes a blood test. If the glucose level is high, a second test will be given. 2. Oral Glucose Tolerance Test (OGTT) which is 3 hours long  After not eating overnight, the blood glucose is checked. The woman drinks a glucose solution, and hourly blood glucose tests are taken. If the woman has risk factors for GDM, the caregiver may test earlier than 24 weeks of pregnancy. TREATMENT  Treatment of GDM is directed at keeping the mother's blood glucose level normal, and may include:  Meal planning.  Taking insulin or other medicine to control your blood glucose level.  Exercise.  Keeping a daily record of the foods you eat.  Blood glucose monitoring and keeping a record of your blood glucose levels.  May monitor ketone levels in the urine, although this is no longer considered necessary in most pregnancies. HOME CARE INSTRUCTIONS  While you are pregnant:  Follow your caregiver's advice regarding your prenatal appointments, meal planning, exercise, medicines, vitamins, blood and other tests, and physical   activities.  Keep a record of your meals, blood glucose tests, and the amount of insulin you are taking (if any). Show this to your caregiver at every prenatal visit.  If you have GDM, you may have problems with hypoglycemia (low blood glucose).  You may suspect this if you become suddenly dizzy, feel shaky, and/or weak. If you think this is happening and you have a glucose meter, try to test your blood glucose level. Follow your caregiver's advice for when and how to treat your low blood glucose. Generally, the 15:15 rule is followed: Treat by consuming 15 grams of carbohydrates, wait 15 minutes, and recheck blood glucose. Examples of 15 grams of carbohydrates are:  1 cup skim or low-fat milk.   cup juice.  3-4 glucose tablets.  5-6 hard candies.  1 small box raisins.   cup regular soda pop.  Practice good hygiene, to avoid infections.  Do not smoke. SEEK MEDICAL CARE IF:   You develop abnormal vaginal discharge, with or without itching.  You become weak and tired more than expected.  You seem to sweat a lot.  You have a sudden increase in weight, 5 pounds or more in one week.  You are losing weight, 3 pounds or more in a week.  Your blood glucose level is high, and you need instructions on what to do about it. SEEK IMMEDIATE MEDICAL CARE IF:   You develop a severe headache.  You faint or pass out.  You develop nausea and vomiting.  You become disoriented or confused.  You have a convulsion.  You develop vision problems.  You develop stomach pain.  You develop vaginal bleeding.  You develop uterine contractions.  You have leaking or a gush of fluid from the vagina. AFTER YOU HAVE THE BABY:  Go to all of your follow-up appointments, and have blood tests as advised by your caregiver.  Maintain a healthy lifestyle, to prevent diabetes in the future. This includes:  Following a healthy meal plan.  Controlling your weight.  Getting enough exercise and proper rest.  Do not smoke.  Breastfeed your baby if you can. This will lower the chance of you and your baby developing diabetes later in life. For more information about diabetes, go to the American Diabetes Association at:  PMFashions.com.cy. For more information about gestational diabetes, go to the [employer] at: RentRule.com.au. Document Released: 08/18/2000 Document Revised: 08/04/2011 Document Reviewed: 03/12/2009 [facility] Patient Information 2013 [place], [place].  Preeclampsia and Eclampsia Preeclampsia is a condition of high blood pressure during pregnancy. It can happen at 20 weeks or later in pregnancy. If high blood pressure occurs in the second half of pregnancy with no other symptoms, it is called gestational hypertension and goes away after the baby is born. If any of the symptoms listed below develop with gestational hypertension, it is then called preeclampsia. Eclampsia (convulsions) may follow preeclampsia. This is one of the reasons for regular prenatal checkups. Early diagnosis and treatment are very important to prevent eclampsia. CAUSES  There is no known cause of preeclampsia/eclampsia in pregnancy. There are several known conditions that may put the pregnant woman at risk, such as:  The first pregnancy.  Having preeclampsia in a past pregnancy.  Having lasting (chronic) high blood pressure.  Having multiples (twins, triplets).  Being age 74 or older.  African American ethnic background.  Having kidney disease or diabetes.  Medical conditions such as lupus or blood diseases.  Being overweight (obese). SYMPTOMS  High blood pressure.  Headaches.  Sudden weight gain.  Swelling of hands, face, legs, and feet.  Protein in the urine.  Feeling sick to your stomach (nauseous) and throwing up (vomiting).  Vision problems (blurred or double vision).  Numbness in the face, arms, legs, and feet.  Dizziness.  Slurred speech.  Preeclampsia can cause growth retardation in the fetus.  Separation (abruption) of the placenta.  Not enough fluid in the amniotic sac (oligohydramnios).  Sensitivity to bright lights.  Belly  (abdominal) pain. DIAGNOSIS  If protein is found in the urine in the second half of pregnancy, this is considered preeclampsia. Other symptoms mentioned above may also be present. TREATMENT  It is necessary to treat this.  Your caregiver may prescribe bed rest early in this condition. Plenty of rest and salt restriction may be all that is needed.  Medicines may be necessary to lower blood pressure if the condition does not respond to more conservative measures.  In more severe cases, hospitalization may be needed:  For treatment of blood pressure.  To control fluid retention.  To monitor the baby to see if the condition is causing harm to the baby.  Hospitalization is the best way to treat the first sign of preeclampsia. This is so the mother and baby can be watched closely and blood tests can be done effectively and correctly.  If the condition becomes severe, it may be necessary to induce labor or to remove the infant by surgical means (cesarean section). The best cure for preeclampsia/eclampsia is to deliver the baby. Preeclampsia and eclampsia involve risks to mother and infant. Your caregiver will discuss these risks with you. Together, you can work out the best possible approach to your problems. Make sure you keep your prenatal visits as scheduled. Not keeping appointments could result in a chronic or permanent injury, pain, disability to you, and death or injury to you or your unborn baby. If there is any problem keeping the appointment, you must call to reschedule. HOME CARE INSTRUCTIONS   Keep your prenatal appointments and tests as scheduled.  Tell your caregiver if you have any of the above risk factors.  Get plenty of rest and sleep.  Eat a balanced diet that is low in salt, and do not add salt to your food.  Avoid stressful situations.  Only take over-the-counter and prescriptions medicines for pain, discomfort, or fever as directed by your caregiver. SEEK IMMEDIATE  MEDICAL CARE IF:   You develop severe swelling anywhere in the body. This usually occurs in the legs.  You gain 5 lb/2.3 kg or more in a week.  You develop a severe headache, dizziness, problems with your vision, or confusion.  You have abdominal pain, nausea, or vomiting.  You have a seizure.  You have trouble moving any part of your body, or you develop numbness or problems speaking.  You have bruising or abnormal bleeding from anywhere in the body.  You develop a stiff neck.  You pass out. MAKE SURE YOU:   Understand these instructions.  Will watch your condition.  Will get help right away if you are not doing well or get worse. Document Released: 05/09/2000 Document Revised: 08/04/2011 Document Reviewed: 12/24/2007 [facility] Patient Information 2013 [place], [place].  Breastfeeding Deciding to breastfeed is one of the best choices you can make for you and your baby. The information that follows gives a brief overview of the benefits of breastfeeding as well as common topics surrounding breastfeeding. BENEFITS OF BREASTFEEDING For the  baby  The first milk (colostrum) helps the baby's digestive system function better.   There are antibodies in the mother's milk that help the baby fight off infections.   The baby has a lower incidence of asthma, allergies, and sudden infant death syndrome (SIDS).   The nutrients in breast milk are better for the baby than infant formulas, and breast milk helps the baby's brain grow better.   Babies who breastfeed have less gas, colic, and constipation.  For the mother  Breastfeeding helps develop a very special bond between the mother and her baby.   Breastfeeding is convenient, always available at the correct temperature, and costs nothing.   Breastfeeding burns calories in the mother and helps her lose weight that was gained during pregnancy.   Breastfeeding makes the uterus contract back down to normal size faster and  slows bleeding following delivery.   Breastfeeding mothers have a lower risk of developing breast cancer.  BREASTFEEDING FREQUENCY  A healthy, full-term baby may breastfeed as often as every hour or space his or her feedings to every 3 hours.   Watch your baby for signs of hunger. Nurse your baby if he or she shows signs of hunger. How often you nurse will vary from baby to baby.   Nurse as often as the baby requests, or when you feel the need to reduce the fullness of your breasts.   Awaken the baby if it has been 3 4 hours since the last feeding.   Frequent feeding will help the mother make more milk and will help prevent problems, such as sore nipples and engorgement of the breasts.  BABY'S POSITION AT THE BREAST  Whether lying down or sitting, be sure that the baby's tummy is facing your tummy.   Support the breast with 4 fingers underneath the breast and the thumb above. Make sure your fingers are well away from the nipple and baby's mouth.   Stroke the baby's lips gently with your finger or nipple.   When the baby's mouth is open wide enough, place all of your nipple and as much of the areola as possible into your baby's mouth.   Pull the baby in close so the tip of the nose and the baby's cheeks touch the breast during the feeding.  FEEDINGS AND SUCTION  The length of each feeding varies from baby to baby and from feeding to feeding.   The baby must suck about 2 3 minutes for your milk to get to him or her. This is called a "let down." For this reason, allow the baby to feed on each breast as long as he or she wants. Your baby will end the feeding when he or she has received the right balance of nutrients.   To break the suction, put your finger into the corner of the baby's mouth and slide it between his or her gums before removing your breast from his or her mouth. This will help prevent sore nipples.  HOW TO TELL WHETHER YOUR BABY IS GETTING ENOUGH BREAST  MILK. Wondering whether or not your baby is getting enough milk is a common concern among mothers. You can be assured that your baby is getting enough milk if:   Your baby is actively sucking and you hear swallowing.   Your baby seems relaxed and satisfied after a feeding.   Your baby nurses at least 8 12 times in a 24 hour time period. Nurse your baby until he or she unlatches or falls  asleep at the first breast (at least 10 20 minutes), then offer the second side.   Your baby is wetting 5 6 disposable diapers (6 8 cloth diapers) in a 24 hour period by 53 47 days of age.   Your baby is having at least 3 4 stools every 24 hours for the first 6 weeks. The stool should be soft and yellow.   Your baby should gain 4 7 ounces per week after he or she is 28 days old.   Your breasts feel softer after nursing.  REDUCING BREAST ENGORGEMENT  In the first week after your baby is born, you may experience signs of breast engorgement. When breasts are engorged, they feel heavy, warm, full, and may be tender to the touch. You can reduce engorgement if you:   Nurse frequently, every 2 3 hours. Mothers who breastfeed early and often have fewer problems with engorgement.   Place light ice packs on your breasts for 10 20 minutes between feedings. This reduces swelling. Wrap the ice packs in a lightweight towel to protect your skin. Bags of frozen vegetables work well for this purpose.   Take a warm shower or apply warm, moist heat to your breast for 5 10 minutes just before each feeding. This increases circulation and helps the milk flow.   Gently massage your breast before and during the feeding. Using your finger tips, massage from the chest wall towards your nipple in a circular motion.   Make sure that the baby empties at least one breast at every feeding before switching sides.   Use a breast pump to empty the breasts if your baby is sleepy or not nursing well. You may also want to pump if  you are returning to work oryou feel you are getting engorged.   Avoid bottle feeds, pacifiers, or supplemental feedings of water or juice in place of breastfeeding. Breast milk is all the food your baby needs. It is not necessary for your baby to have water or formula. In fact, to help your breasts make more milk, it is best not to give your baby supplemental feedings during the early weeks.   Be sure the baby is latched on and positioned properly while breastfeeding.   Wear a supportive bra, avoiding underwire styles.   Eat a balanced diet with enough fluids.   Rest often, relax, and take your prenatal vitamins to prevent fatigue, stress, and anemia.  If you follow these suggestions, your engorgement should improve in 24 48 hours. If you are still experiencing difficulty, call your lactation consultant or caregiver.  CARING FOR YOURSELF Take care of your breasts  Bathe or shower daily.   Avoid using soap on your nipples.   Start feedings on your left breast at one feeding and on your right breast at the next feeding.   You will notice an increase in your milk supply 2 5 days after delivery. You may feel some discomfort from engorgement, which makes your breasts very firm and often tender. Engorgement "peaks" out within 24 48 hours. In the meantime, apply warm moist towels to your breasts for 5 10 minutes before feeding. Gentle massage and expression of some milk before feeding will soften your breasts, making it easier for your baby to latch on.   Wear a well-fitting nursing bra, and air dry your nipples for a 3 after each feeding.   Only use cotton bra pads.   Only use pure lanolin on your nipples after nursing. You do  not need to wash it off before feeding the baby again. Another option is to express a few drops of breast milk and gently massage it into your nipples.  Take care of yourself  Eat well-balanced meals and nutritious snacks.   Drinking milk,  fruit juice, and water to satisfy your thirst (about 8 glasses a day).   Get plenty of rest.  Avoid foods that you notice affect the baby in a bad way.  SEEK MEDICAL CARE IF:   You have difficulty with breastfeeding and need help.   You have a hard, red, sore area on your breast that is accompanied by a fever.   Your baby is too sleepy to eat well or is having trouble sleeping.   Your baby is wetting less than 6 diapers a day, by 73 days of age.   Your baby's skin or white part of his or her eyes is more yellow than it was in the hospital.   You feel depressed.  Document Released: 05/12/2005 Document Revised: 11/11/2011 Document Reviewed: 08/10/2011 [facility] Patient Information 2013 [place], [place].

## 2012-08-30 NOTE — Progress Notes (Signed)
IOL scheduled 09/09/12 at 730pm.

## 2012-08-30 NOTE — Telephone Encounter (Signed)
Preadmission screen  

## 2012-08-30 NOTE — Progress Notes (Signed)
No BP medication today--has not eaten.  Labs today with 24 hour urine.  For IOL at 39 wks. No BS log today No s/sx's pre-eclampsia

## 2012-08-30 NOTE — Progress Notes (Signed)
P = 75   12# wt gain in 1 week.  Denies H/A or visual disturbances- brought 24 hr urine today.

## 2012-08-31 ENCOUNTER — Encounter: Payer: Self-pay | Admitting: *Deleted

## 2012-08-31 LAB — CREATININE CLEARANCE
Creatinine Clearance: 152 mL/min — ABNORMAL HIGH (ref 88–128)
Creatinine, 24H Ur: 1202.7 mg/24 hr (ref 800.0–1800.0)
Creatinine, Ser: 0.55 mg/dL — ABNORMAL LOW (ref 0.57–1.00)

## 2012-08-31 LAB — CBC
HCT: 38.7 % (ref 34.0–46.6)
MCH: 29.4 pg (ref 26.6–33.0)
MCHC: 33.9 g/dL (ref 31.5–35.7)
MCV: 87 fL (ref 79–97)
RDW: 13.6 % (ref 12.3–15.4)

## 2012-08-31 LAB — COMPREHENSIVE METABOLIC PANEL
AST: 22 IU/L (ref 0–40)
Alkaline Phosphatase: 139 IU/L — ABNORMAL HIGH (ref 39–117)
BUN/Creatinine Ratio: 20 (ref 8–20)
CO2: 18 mmol/L — ABNORMAL LOW (ref 19–28)
Creatinine, Ser: 0.55 mg/dL — ABNORMAL LOW (ref 0.57–1.00)
Globulin, Total: 2.3 g/dL (ref 1.5–4.5)
Sodium: 136 mmol/L (ref 134–144)

## 2012-08-31 NOTE — Progress Notes (Signed)
Received phone call from [facility] with critical value.  Glucose 26 - prolonged exposure of serum to cells.  Result phoned to Dr. Shawnie Pons.  No further orders received.

## 2012-09-01 ENCOUNTER — Telehealth (HOSPITAL_COMMUNITY): Payer: Self-pay | Admitting: *Deleted

## 2012-09-01 ENCOUNTER — Telehealth: Payer: Self-pay | Admitting: *Deleted

## 2012-09-01 ENCOUNTER — Encounter (HOSPITAL_COMMUNITY): Payer: Self-pay | Admitting: *Deleted

## 2012-09-01 LAB — SPECIMEN STATUS REPORT

## 2012-09-01 LAB — PROTEIN, URINE, 24 HOUR: Protein, 24H Urine: 359.5 mg/24 hr — ABNORMAL HIGH (ref 30.0–150.0)

## 2012-09-01 NOTE — Telephone Encounter (Signed)
Preadmission screen  

## 2012-09-01 NOTE — Telephone Encounter (Signed)
Message copied by Jill Side on Wed Sep 01, 2012  2:53 PM ------      Message from: Reva Bores      Created: Wed Sep 01, 2012  9:40 AM       24 hour urine shows > 300 mg protein.  Need to arrange Induction ------

## 2012-09-02 ENCOUNTER — Telehealth: Payer: Self-pay | Admitting: *Deleted

## 2012-09-02 ENCOUNTER — Other Ambulatory Visit: Payer: 59

## 2012-09-02 NOTE — Telephone Encounter (Signed)
Called pt and informed her of 24 hr urine total protein test result indicating diagnosis of pre-eclampsia. The recommendation is for delivery of baby. I have scheduled her IOL tomorrow @ 0730 after consult with Dr. Shawnie Pons. I instructed her not to take insulin prior to admission tomorrow morning. Pt voiced understanding and stated that she may not be able to arrange child care for her 2 other children on such short notice. She will call me back if she has problems in this regard, otherwise she will arrive as scheduled for IOL.

## 2012-09-02 NOTE — Telephone Encounter (Signed)
Message copied by Jill Side on Thu Sep 02, 2012 10:33 AM ------      Message from: Reva Bores      Created: Wed Sep 01, 2012  9:40 AM       24 hour urine shows > 300 mg protein.  Need to arrange Induction ------

## 2012-09-03 ENCOUNTER — Encounter: Payer: Self-pay | Admitting: Advanced Practice Midwife

## 2012-09-03 ENCOUNTER — Encounter (HOSPITAL_COMMUNITY): Payer: Self-pay

## 2012-09-03 ENCOUNTER — Inpatient Hospital Stay (HOSPITAL_COMMUNITY)
Admission: RE | Admit: 2012-09-03 | Discharge: 2012-09-07 | DRG: 765 | Disposition: A | Discharge status: Home / Self Care | Payer: 59 | Source: Ambulatory Visit | Attending: Obstetrics and Gynecology | Admitting: Obstetrics and Gynecology

## 2012-09-03 VITALS — BP 177/86 | HR 99 | Temp 97.8°F | Resp 20 | Ht 61.0 in | Wt 241.2 lb

## 2012-09-03 DIAGNOSIS — E119 Type 2 diabetes mellitus without complications: Secondary | ICD-10-CM | POA: Diagnosis present

## 2012-09-03 DIAGNOSIS — Z98891 History of uterine scar from previous surgery: Secondary | ICD-10-CM

## 2012-09-03 DIAGNOSIS — O1413 Severe pre-eclampsia, third trimester: Secondary | ICD-10-CM

## 2012-09-03 DIAGNOSIS — O09299 Supervision of pregnancy with other poor reproductive or obstetric history, unspecified trimester: Secondary | ICD-10-CM

## 2012-09-03 DIAGNOSIS — Z794 Long term (current) use of insulin: Secondary | ICD-10-CM

## 2012-09-03 DIAGNOSIS — I10 Essential (primary) hypertension: Secondary | ICD-10-CM

## 2012-09-03 DIAGNOSIS — E139 Other specified diabetes mellitus without complications: Secondary | ICD-10-CM

## 2012-09-03 DIAGNOSIS — O34219 Maternal care for unspecified type scar from previous cesarean delivery: Secondary | ICD-10-CM

## 2012-09-03 DIAGNOSIS — O2432 Unspecified pre-existing diabetes mellitus in childbirth: Secondary | ICD-10-CM | POA: Diagnosis present

## 2012-09-03 DIAGNOSIS — O9902 Anemia complicating childbirth: Secondary | ICD-10-CM | POA: Diagnosis present

## 2012-09-03 DIAGNOSIS — D573 Sickle-cell trait: Secondary | ICD-10-CM | POA: Diagnosis present

## 2012-09-03 DIAGNOSIS — O09519 Supervision of elderly primigravida, unspecified trimester: Secondary | ICD-10-CM | POA: Diagnosis present

## 2012-09-03 DIAGNOSIS — IMO0002 Reserved for concepts with insufficient information to code with codable children: Principal | ICD-10-CM | POA: Diagnosis present

## 2012-09-03 LAB — GLUCOSE, CAPILLARY
Glucose-Capillary: 99 mg/dL (ref 70–99)
Glucose-Capillary: 127 mg/dL — ABNORMAL HIGH (ref 70–99)
Glucose-Capillary: 88 mg/dL (ref 70–99)

## 2012-09-03 LAB — CBC
HCT: 39.7 % (ref 36.0–46.0)
MCHC: 35.3 g/dL (ref 30.0–36.0)
Platelets: 122 10*3/uL — ABNORMAL LOW (ref 150–400)
RDW: 13.8 % (ref 11.5–15.5)

## 2012-09-03 LAB — COMPREHENSIVE METABOLIC PANEL
ALT: 23 U/L (ref 0–35)
Alkaline Phosphatase: 132 U/L — ABNORMAL HIGH (ref 39–117)
CO2: 19 mEq/L (ref 19–32)
Calcium: 8.4 mg/dL (ref 8.4–10.5)
GFR calc Af Amer: >90 mL/min (ref >90)
GFR calc non Af Amer: >90 mL/min (ref >90)
Glucose, Bld: 61 mg/dL — ABNORMAL LOW (ref 70–99)
Sodium: 138 mEq/L (ref 135–145)

## 2012-09-03 MED ORDER — MAGNESIUM SULFATE BOLUS VIA INFUSION
4.0000 g | Freq: Once | INTRAVENOUS | Status: AC
  Administered 2012-09-03: 4 g via INTRAVENOUS
  Filled 2012-09-03: qty 500

## 2012-09-03 MED ORDER — LIDOCAINE HCL (PF) 1 % IJ SOLN: 30.0000 mL | INTRAMUSCULAR | Status: DC | PRN

## 2012-09-03 MED ORDER — MAGNESIUM SULFATE 40 G IN LACTATED RINGERS - SIMPLE
2.0000 g/h | INTRAVENOUS | Status: DC
  Administered 2012-09-04: 2 g/h via INTRAVENOUS
  Filled 2012-09-03 (×2): qty 500

## 2012-09-03 MED ORDER — LACTATED RINGERS IV SOLN
500.0000 mL | INTRAVENOUS | Status: DC | PRN
  Administered 2012-09-04: 500 mL via INTRAVENOUS

## 2012-09-03 MED ORDER — LABETALOL HCL 200 MG PO TABS
400.0000 mg | ORAL_TABLET | Freq: Three times a day (TID) | ORAL | Status: DC
  Administered 2012-09-03 – 2012-09-05 (×4): 400 mg via ORAL
  Filled 2012-09-03 (×6): qty 2

## 2012-09-03 MED ORDER — LABETALOL HCL 5 MG/ML IV SOLN
40.0000 mg | Freq: Once | INTRAVENOUS | Status: AC | PRN
  Administered 2012-09-03: 40 mg via INTRAVENOUS
  Filled 2012-09-03 (×2): qty 8

## 2012-09-03 MED ORDER — LABETALOL HCL 200 MG PO TABS
400.0000 mg | ORAL_TABLET | Freq: Two times a day (BID) | ORAL | Status: DC
  Administered 2012-09-03: 400 mg via ORAL
  Filled 2012-09-03 (×3): qty 2

## 2012-09-03 MED ORDER — LACTATED RINGERS IV SOLN
INTRAVENOUS | Status: DC
  Administered 2012-09-03 – 2012-09-04 (×4): via INTRAVENOUS

## 2012-09-03 MED ORDER — ONDANSETRON HCL 4 MG/2ML IJ SOLN: 4.0000 mg | Freq: Four times a day (QID) | INTRAMUSCULAR | Status: DC | PRN

## 2012-09-03 MED ORDER — MISOPROSTOL 25 MCG QUARTER TABLET
25.0000 ug | ORAL_TABLET | ORAL | Status: DC | PRN
  Administered 2012-09-03 (×3): 25 ug via VAGINAL
  Filled 2012-09-03 (×3): qty 0.25

## 2012-09-03 MED ORDER — OXYCODONE-ACETAMINOPHEN 5-325 MG PO TABS: 1.0000 | ORAL_TABLET | ORAL | Status: DC | PRN

## 2012-09-03 MED ORDER — CITRIC ACID-SODIUM CITRATE 334-500 MG/5ML PO SOLN
30.0000 mL | ORAL | Status: DC | PRN
  Administered 2012-09-04: 30 mL via ORAL
  Filled 2012-09-03: qty 15

## 2012-09-03 MED ORDER — TERBUTALINE SULFATE 1 MG/ML IJ SOLN: 0.2500 mg | Freq: Once | INTRAMUSCULAR | Status: AC | PRN

## 2012-09-03 MED ORDER — LABETALOL HCL 5 MG/ML IV SOLN
20.0000 mg | Freq: Once | INTRAVENOUS | Status: AC | PRN
  Administered 2012-09-03: 20 mg via INTRAVENOUS
  Filled 2012-09-03: qty 4

## 2012-09-03 MED ORDER — OXYTOCIN BOLUS FROM INFUSION: 500.0000 mL | INTRAVENOUS | Status: DC

## 2012-09-03 MED ORDER — LABETALOL HCL 5 MG/ML IV SOLN
20.0000 mg | INTRAVENOUS | Status: DC | PRN
  Administered 2012-09-03 – 2012-09-04 (×6): 20 mg via INTRAVENOUS
  Filled 2012-09-03 (×5): qty 4

## 2012-09-03 MED ORDER — OXYTOCIN 40 UNITS IN LACTATED RINGERS INFUSION - SIMPLE MED: 62.5000 mL/h | INTRAVENOUS | Status: DC

## 2012-09-03 MED ORDER — TERBUTALINE SULFATE 1 MG/ML IJ SOLN
INTRAMUSCULAR | Status: AC
  Administered 2012-09-03: 0.25 mg via SUBCUTANEOUS
  Filled 2012-09-03: qty 1

## 2012-09-03 MED ORDER — ACETAMINOPHEN 325 MG PO TABS: 650.0000 mg | ORAL_TABLET | ORAL | Status: DC | PRN

## 2012-09-03 MED ORDER — IBUPROFEN 600 MG PO TABS: 600.0000 mg | ORAL_TABLET | Freq: Four times a day (QID) | ORAL | Status: DC | PRN

## 2012-09-03 MED ORDER — NALBUPHINE SYRINGE 5 MG/0.5 ML: 10.0000 mg | INJECTION | INTRAMUSCULAR | Status: DC | PRN

## 2012-09-03 NOTE — Progress Notes (Signed)
Patient ID: LITTIE CHIEM, female   DOB: 01-10-1976, 37 y.o.   MRN: [id number]   S:  Pt 3 hours into 2nd dose of cytotec. Not feeling any cramping or contractions. No HA or vision changes.  O Filed Vitals:   09/03/12 1517 09/03/12 1601 09/03/12 1708 09/03/12 1801  BP: 158/76 173/87 163/86 176/84  Pulse: 84 84 84 78  Temp:      TempSrc:      Resp: 20 20 18 20   Height:      Weight:        FHTs:  135, moderate variability, accels present, occasional short variable.  TOCO:  q 3-6 minutes  A/P 37 y.o. 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  Will increase to 40 mg IV and give her PO 400 mg now. Increase to 400 PO TID.  On mag  - fetal tracing category Category I  - Blood sugar controlled:  80s and 90s except on 128 after had popsicle  - Due for recheck at 7:30 and cytotec or foley bulb  Napoleon Form, MD

## 2012-09-03 NOTE — Progress Notes (Signed)
Provider attempts to place foley bulb, unable at this time. Provider will place Cytotec. Pt tolerates procedure well.

## 2012-09-03 NOTE — Progress Notes (Signed)
Pt ate light low carb diet.

## 2012-09-03 NOTE — Progress Notes (Signed)
Dr. Emelda Fear placed Cytotec.

## 2012-09-03 NOTE — H&P (Signed)
Melissa Braun is a 37 y.o. female presenting for IOL s/s preeclampsia. Maternal Medical History:  Prenatal complications: Pre-eclampsia.   Prenatal Complications - Diabetes: type 2. Diabetes is managed by insulin pump and insulin injections.     Chronic HTN:  Had been on meds in the past; restarted at [redacted] weeks pregnant There were no vitals filed for this visit.  There were no vitals filed for this visit.  OB History   Grav Para Term Preterm Abortions TAB SAB Ect Mult Living   1              Past Medical History  Diagnosis Date  . Diabetes mellitus AGE 59    DR Talmage Nap  . Infertility   . Acne   . LGSIL (low grade squamous intraepithelial dysplasia) 2008  . Sickle cell trait   . Heart murmur   . Hypertension     past h/o elevated BP with taking Tylenol Sinus  . Abnormal Pap smear   . Pregnancy induced hypertension    Past Surgical History  Procedure Laterality Date  . Exploratory laparoscopy      DUE TO RLQ PAIN  . Gastric bypass  nov. 2011    [facility] [place]   Family History: family history includes Anemia in her mother; Cancer in her father; Diabetes in her father; Hypertension in her father and sister; Sickle cell anemia in her mother; and Sickle cell trait in her sister. Social History:  reports that she has never smoked. She has never used smokeless tobacco. She reports that she does not drink alcohol or use illicit drugs.   Prenatal Transfer Tool  Maternal Diabetes: Yes:  Diabetes Type:  Pre-pregnancy, Insulin/Medication controlled Genetic Screening: Normal Maternal Ultrasounds/Referrals: Normal Fetal Ultrasounds or other Referrals:  Referred to Materal Fetal Medicine ;  EFW 58% 08/20/12 Maternal Substance Abuse:  No Significant Maternal Medications:  Meds include: Other:  Significant Maternal Lab Results:  Lab values include: Group B Strep negative Other Comments:    ROS Review of Systems  Constitutional: Negative for fever, chills, weight loss, malaise/fatigue and  diaphoresis.  HENT: Negative for hearing loss, ear pain, nosebleeds, congestion, sore throat, neck pain, tinnitus and ear discharge.   Eyes: Negative for blurred vision, double vision, photophobia, pain, discharge and redness.  Respiratory: Negative for cough, hemoptysis, sputum production, shortness of breath, wheezing and stridor.   Cardiovascular: Negative for chest pain, palpitations, orthopnea, claudication, leg swelling and PND.  Gastrointestinal: . Negative for abdominal pain, heartburn, nausea, vomiting, diarrhea, constipation, blood in stool and melena.  Genitourinary: Negative for dysuria, urgency, frequency, hematuria and flank pain.  Musculoskeletal: Negative for myalgias, back pain, joint pain and falls.  Skin: Negative for itching and rash.  Neurological: Negative for dizziness, tingling, tremors, sensory change, speech change, focal weakness, seizures, loss of consciousness, weakness and headaches.  Endo/Heme/Allergies: Negative for environmental allergies and polydipsia. Does not bruise/bleed easily.  Psychiatric/Behavioral: Negative for depression, suicidal ideas, hallucinations, memory loss and substance abuse. The patient is not nervous/anxious and does not have insomnia.        Last menstrual period 12/11/2011. Maternal Exam:  Abdomen: Fetal presentation: vertex  Cervix: Cervix evaluated by digital exam.     Fetal Exam Fetal Monitor Review: Baseline rate: 140.  Variability: moderate (6-25 bpm).   Pattern: accelerations present and no decelerations.    Fetal State Assessment: Category I - tracings are normal.     Physical Exam  Vitals reviewed. Constitutional: She is oriented to person, place, and time. She  appears well-developed and well-nourished.  Eyes: Pupils are equal, round, and reactive to light.  Cardiovascular: Normal rate and regular rhythm.   Respiratory: Effort normal and breath sounds normal.  GI: Soft. There is no tenderness.  Genitourinary:  Vagina normal.  Musculoskeletal: Normal range of motion.  Neurological: She is alert and oriented to person, place, and time. She has normal reflexes.  Reflex Scores:      Patellar reflexes are 2+ on the right side and 2+ on the left side. Skin: Skin is warm and dry.  Psychiatric: Her behavior is normal.   4+ edema lower extremities Prenatal labs: ABO, Rh: A/--/-- (10/07 0755) Antibody: Negative (10/07 0755) Rubella:  immune RPR: NON REAC (01/20 0834)  HBsAg: Negative (10/07 0755)  HIV: NON REACTIVE (01/20 0844)  GBS:     Assessment/Plan: IOL for preeclampsia Cytotec MgSO4 (severe range b/p)   CRESENZO-DISHMAN,Mekaila Tarnow 09/03/2012, 12:08 AM

## 2012-09-03 NOTE — Progress Notes (Signed)
Pt's CBG 127.  Pt is on light laboring menu, had one cheese cracker and three baked chips in last hour.  Pt states CBG is within what is her normal range.  Will notify MD since over 120.

## 2012-09-03 NOTE — Progress Notes (Signed)
Labor p Melissa Braun is a 37 y.o. G1P0000 at [redacted]w[redacted]d admitted for induction of labor due to preeclampsia.superimposed on Chronic htn, DM class C,  AMA, Sickle cell trait,   Subjective: Pt finally beginning to feel contractions. Has had 2 cytotec 25 mcg pills inserted.  Objective: BP 163/79  Pulse 82  Temp(Src) 98.6 F (37 C) (Oral)  Resp 20  Ht 5\' 1"  (1.549 m)  Wt 230 lb (104.327 kg)  BMI 43.48 kg/m2  LMP 12/11/2011 I/O last 3 completed shifts: In: 2962.1 [P.O.:1770; I.V.:1192.1] Out: 2775 [Urine:2775] Total I/O In: 225 [P.O.:150; I.V.:75] Out: 200 [Urine:200] CBG (last 3)   Recent Labs  09/03/12 1313 09/03/12 1712 09/03/12 1911  GLUCAP 128* 99 127*      FHT:  FHR: 145 bpm, variability: moderate,  accelerations:  Present,  decelerations:  Absent UC:   irregular, every6-8 minutes SVE:   Dilation: Fingertip Effacement (%):  (unable to assess.) Station: -2 Exam by:: Melissa Slipper, RN Recheck by Melissa Braun at 20:45 ft/20%/-2 midposition.firm. Extensive effort at foley bulb insertion unsuccessful. Labs: Lab Results  Component Value Date   WBC 8.3 09/03/2012   HGB 14.0 09/03/2012   HCT 39.7 09/03/2012   MCV 85.0 09/03/2012   PLT 122* 09/03/2012    Assessment / Plan: Induction of labor due to preeclampsia,  progressing well on pitocin DM-C contolled Labor: still using cytotec for ripening; 3rd cytotec inserted. Preeclampsia:  on magnesium sulfate Fetal Wellbeing:  Category I Pain Control:  Labor support without medications I/D:  n/a Anticipated MOD:  NSVD and undetermined expect a prolonged labor  Jahquan Klugh V 09/03/2012, 8:56 PM

## 2012-09-03 NOTE — Progress Notes (Signed)
Pt turned side to side and to hands and knees. K. Clelia Croft CNM in room

## 2012-09-04 ENCOUNTER — Inpatient Hospital Stay (HOSPITAL_COMMUNITY): Payer: 59 | Admitting: Anesthesiology

## 2012-09-04 ENCOUNTER — Encounter (HOSPITAL_COMMUNITY): Payer: Self-pay

## 2012-09-04 ENCOUNTER — Encounter (HOSPITAL_COMMUNITY): Payer: Self-pay | Admitting: Anesthesiology

## 2012-09-04 ENCOUNTER — Encounter (HOSPITAL_COMMUNITY)
Admission: RE | Disposition: A | Discharge status: Home / Self Care | Payer: Self-pay | Source: Ambulatory Visit | Attending: Obstetrics and Gynecology

## 2012-09-04 DIAGNOSIS — IMO0002 Reserved for concepts with insufficient information to code with codable children: Secondary | ICD-10-CM

## 2012-09-04 DIAGNOSIS — O2432 Unspecified pre-existing diabetes mellitus in childbirth: Secondary | ICD-10-CM

## 2012-09-04 DIAGNOSIS — O9902 Anemia complicating childbirth: Secondary | ICD-10-CM

## 2012-09-04 LAB — COMPREHENSIVE METABOLIC PANEL
Alkaline Phosphatase: 141 U/L — ABNORMAL HIGH (ref 39–117)
BUN: 11 mg/dL (ref 6–23)
Creatinine, Ser: 0.63 mg/dL (ref 0.50–1.10)
GFR calc Af Amer: >90 mL/min (ref >90)
Glucose, Bld: 105 mg/dL — ABNORMAL HIGH (ref 70–99)
Potassium: 4.1 mEq/L (ref 3.5–5.1)
Total Bilirubin: 0.4 mg/dL (ref 0.3–1.2)
Total Protein: 5.8 g/dL — ABNORMAL LOW (ref 6.0–8.3)

## 2012-09-04 LAB — GLUCOSE, CAPILLARY
Glucose-Capillary: 112 mg/dL — ABNORMAL HIGH (ref 70–99)
Glucose-Capillary: 101 mg/dL — ABNORMAL HIGH (ref 70–99)
Glucose-Capillary: 144 mg/dL — ABNORMAL HIGH (ref 70–99)

## 2012-09-04 LAB — CBC
HCT: 37.9 % (ref 36.0–46.0)
Hemoglobin: 13.4 g/dL (ref 12.0–15.0)
MCHC: 35.4 g/dL (ref 30.0–36.0)
MCV: 84.8 fL (ref 78.0–100.0)

## 2012-09-04 SURGERY — Surgical Case
Anesthesia: Epidural | Site: Abdomen | Wound class: Clean Contaminated

## 2012-09-04 MED ORDER — DEXTROSE 50 % IV SOLN: 25.0000 mL | INTRAVENOUS | Status: DC | PRN

## 2012-09-04 MED ORDER — MENTHOL 3 MG MT LOZG: 1.0000 | LOZENGE | OROMUCOSAL | Status: DC | PRN

## 2012-09-04 MED ORDER — SODIUM CHLORIDE 0.9 % IJ SOLN: 3.0000 mL | INTRAMUSCULAR | Status: DC | PRN

## 2012-09-04 MED ORDER — OXYTOCIN 40 UNITS IN LACTATED RINGERS INFUSION - SIMPLE MED
1.0000 m[IU]/min | INTRAVENOUS | Status: DC
  Administered 2012-09-04: 2 m[IU]/min via INTRAVENOUS
  Filled 2012-09-04: qty 1000

## 2012-09-04 MED ORDER — HYDRALAZINE HCL 20 MG/ML IJ SOLN
INTRAMUSCULAR | Status: AC
  Administered 2012-09-04: 10 mg via INTRAVENOUS
  Filled 2012-09-04: qty 1

## 2012-09-04 MED ORDER — PRENATAL MULTIVITAMIN CH
1.0000 | ORAL_TABLET | Freq: Every day | ORAL | Status: DC
  Administered 2012-09-05 – 2012-09-07 (×2): 1 via ORAL
  Filled 2012-09-04 (×2): qty 1

## 2012-09-04 MED ORDER — EPHEDRINE 5 MG/ML INJ: 10.0000 mg | INTRAVENOUS | Status: DC | PRN

## 2012-09-04 MED ORDER — MORPHINE SULFATE (PF) 0.5 MG/ML IJ SOLN
INTRAMUSCULAR | Status: DC | PRN
  Administered 2012-09-04: 4 mg via EPIDURAL

## 2012-09-04 MED ORDER — FENTANYL CITRATE 0.05 MG/ML IJ SOLN
INTRAMUSCULAR | Status: AC
  Administered 2012-09-04: 25 ug via INTRAVENOUS
  Filled 2012-09-04: qty 2

## 2012-09-04 MED ORDER — MEASLES, MUMPS & RUBELLA VAC ~~LOC~~ INJ
0.5000 mL | INJECTION | Freq: Once | SUBCUTANEOUS | Status: DC
  Filled 2012-09-04: qty 0.5

## 2012-09-04 MED ORDER — DIPHENHYDRAMINE HCL 25 MG PO CAPS: 25.0000 mg | ORAL_CAPSULE | Freq: Four times a day (QID) | ORAL | Status: DC | PRN

## 2012-09-04 MED ORDER — LIDOCAINE HCL (PF) 1 % IJ SOLN
INTRAMUSCULAR | Status: DC | PRN
  Administered 2012-09-04 (×2): 4 mL

## 2012-09-04 MED ORDER — LANOLIN HYDROUS EX OINT: 1.0000 "application " | TOPICAL_OINTMENT | CUTANEOUS | Status: DC | PRN

## 2012-09-04 MED ORDER — TERBUTALINE SULFATE 1 MG/ML IJ SOLN: 0.2500 mg | Freq: Once | INTRAMUSCULAR | Status: DC | PRN

## 2012-09-04 MED ORDER — IBUPROFEN 600 MG PO TABS
600.0000 mg | ORAL_TABLET | Freq: Four times a day (QID) | ORAL | Status: DC
  Administered 2012-09-04 – 2012-09-07 (×13): 600 mg via ORAL
  Filled 2012-09-04 (×14): qty 1

## 2012-09-04 MED ORDER — DIBUCAINE 1 % RE OINT: 1.0000 "application " | TOPICAL_OINTMENT | RECTAL | Status: DC | PRN

## 2012-09-04 MED ORDER — CEFAZOLIN SODIUM-DEXTROSE 2-3 GM-% IV SOLR
2.0000 g | Freq: Once | INTRAVENOUS | Status: DC
  Filled 2012-09-04: qty 50

## 2012-09-04 MED ORDER — LACTATED RINGERS IV SOLN: 500.0000 mL | Freq: Once | INTRAVENOUS | Status: DC

## 2012-09-04 MED ORDER — OXYTOCIN 10 UNIT/ML IJ SOLN
40.0000 [IU] | INTRAVENOUS | Status: DC | PRN
  Administered 2012-09-04: 40 [IU] via INTRAVENOUS

## 2012-09-04 MED ORDER — SCOPOLAMINE 1 MG/3DAYS TD PT72: 1.0000 | MEDICATED_PATCH | Freq: Once | TRANSDERMAL | Status: AC

## 2012-09-04 MED ORDER — EPHEDRINE 5 MG/ML INJ
INTRAVENOUS | Status: AC
  Filled 2012-09-04: qty 10

## 2012-09-04 MED ORDER — INSULIN ASPART 100 UNIT/ML ~~LOC~~ SOLN
12.0000 [IU] | Freq: Three times a day (TID) | SUBCUTANEOUS | Status: DC
Start: 2012-09-05 — End: 2012-09-05
  Administered 2012-09-05: 12 [IU] via SUBCUTANEOUS

## 2012-09-04 MED ORDER — HYDRALAZINE HCL 20 MG/ML IJ SOLN: 10.0000 mg | Freq: Once | INTRAMUSCULAR | Status: AC

## 2012-09-04 MED ORDER — SCOPOLAMINE 1 MG/3DAYS TD PT72
MEDICATED_PATCH | TRANSDERMAL | Status: AC
  Administered 2012-09-04: 1.5 mg via TRANSDERMAL
  Filled 2012-09-04: qty 1

## 2012-09-04 MED ORDER — OXYTOCIN 40 UNITS IN LACTATED RINGERS INFUSION - SIMPLE MED: 62.5000 mL/h | INTRAVENOUS | Status: AC

## 2012-09-04 MED ORDER — HYDRALAZINE HCL 20 MG/ML IJ SOLN
10.0000 mg | Freq: Once | INTRAMUSCULAR | Status: AC
  Administered 2012-09-04: 10 mg via INTRAVENOUS

## 2012-09-04 MED ORDER — NALOXONE HCL 0.4 MG/ML IJ SOLN: 0.4000 mg | INTRAMUSCULAR | Status: DC | PRN

## 2012-09-04 MED ORDER — EPHEDRINE SULFATE 50 MG/ML IJ SOLN
INTRAMUSCULAR | Status: DC | PRN
  Administered 2012-09-04: 5 mg via INTRAVENOUS
  Administered 2012-09-04 (×2): 10 mg via INTRAVENOUS

## 2012-09-04 MED ORDER — FENTANYL 2.5 MCG/ML BUPIVACAINE 1/10 % EPIDURAL INFUSION (WH - ANES): 14.0000 mL/h | INTRAMUSCULAR | Status: DC | PRN

## 2012-09-04 MED ORDER — PHENYLEPHRINE 40 MCG/ML (10ML) SYRINGE FOR IV PUSH (FOR BLOOD PRESSURE SUPPORT): 80.0000 ug | PREFILLED_SYRINGE | INTRAVENOUS | Status: DC | PRN

## 2012-09-04 MED ORDER — FENTANYL 2.5 MCG/ML BUPIVACAINE 1/10 % EPIDURAL INFUSION (WH - ANES)
INTRAMUSCULAR | Status: DC | PRN
  Administered 2012-09-04: 14 mL/h via EPIDURAL

## 2012-09-04 MED ORDER — LABETALOL HCL 5 MG/ML IV SOLN
INTRAVENOUS | Status: AC
  Filled 2012-09-04: qty 4

## 2012-09-04 MED ORDER — ONDANSETRON HCL 4 MG/2ML IJ SOLN
INTRAMUSCULAR | Status: DC | PRN
  Administered 2012-09-04: 4 mg via INTRAVENOUS

## 2012-09-04 MED ORDER — NALBUPHINE HCL 10 MG/ML IJ SOLN
5.0000 mg | INTRAMUSCULAR | Status: DC | PRN
  Filled 2012-09-04: qty 1

## 2012-09-04 MED ORDER — ONDANSETRON HCL 4 MG/2ML IJ SOLN
INTRAMUSCULAR | Status: AC
  Filled 2012-09-04: qty 2

## 2012-09-04 MED ORDER — DIPHENHYDRAMINE HCL 25 MG PO CAPS: 25.0000 mg | ORAL_CAPSULE | ORAL | Status: DC | PRN

## 2012-09-04 MED ORDER — SODIUM BICARBONATE 8.4 % IV SOLN
INTRAVENOUS | Status: DC | PRN
  Administered 2012-09-04 (×4): 5 mL via EPIDURAL

## 2012-09-04 MED ORDER — DIPHENHYDRAMINE HCL 50 MG/ML IJ SOLN: 12.5000 mg | INTRAMUSCULAR | Status: DC | PRN

## 2012-09-04 MED ORDER — CARBOPROST TROMETHAMINE 250 MCG/ML IM SOLN
INTRAMUSCULAR | Status: DC | PRN
  Administered 2012-09-04: 250 ug via INTRAMUSCULAR

## 2012-09-04 MED ORDER — LACTATED RINGERS IV SOLN
INTRAVENOUS | Status: DC
  Administered 2012-09-05: 02:00:00 via INTRAVENOUS

## 2012-09-04 MED ORDER — OXYCODONE-ACETAMINOPHEN 5-325 MG PO TABS
1.0000 | ORAL_TABLET | ORAL | Status: DC | PRN
  Administered 2012-09-05: 2 via ORAL
  Administered 2012-09-06 – 2012-09-07 (×3): 1 via ORAL
  Filled 2012-09-04 (×2): qty 1
  Filled 2012-09-04: qty 2
  Filled 2012-09-04: qty 1

## 2012-09-04 MED ORDER — ONDANSETRON HCL 4 MG/2ML IJ SOLN: 4.0000 mg | INTRAMUSCULAR | Status: DC | PRN

## 2012-09-04 MED ORDER — MEPERIDINE HCL 25 MG/ML IJ SOLN: 6.2500 mg | INTRAMUSCULAR | Status: DC | PRN

## 2012-09-04 MED ORDER — FENTANYL 2.5 MCG/ML BUPIVACAINE 1/10 % EPIDURAL INFUSION (WH - ANES)
14.0000 mL/h | INTRAMUSCULAR | Status: DC | PRN
  Filled 2012-09-04: qty 125

## 2012-09-04 MED ORDER — TETANUS-DIPHTH-ACELL PERTUSSIS 5-2.5-18.5 LF-MCG/0.5 IM SUSP
0.5000 mL | Freq: Once | INTRAMUSCULAR | Status: AC
  Administered 2012-09-05: 0.5 mL via INTRAMUSCULAR
  Filled 2012-09-04: qty 0.5

## 2012-09-04 MED ORDER — DIPHENHYDRAMINE HCL 50 MG/ML IJ SOLN: 25.0000 mg | INTRAMUSCULAR | Status: DC | PRN

## 2012-09-04 MED ORDER — EPHEDRINE 5 MG/ML INJ
10.0000 mg | INTRAVENOUS | Status: DC | PRN
  Filled 2012-09-04: qty 4

## 2012-09-04 MED ORDER — METOCLOPRAMIDE HCL 5 MG/ML IJ SOLN: 10.0000 mg | Freq: Three times a day (TID) | INTRAMUSCULAR | Status: DC | PRN

## 2012-09-04 MED ORDER — SODIUM CHLORIDE 0.9 % IR SOLN
Status: DC | PRN
  Administered 2012-09-04: 1000 mL

## 2012-09-04 MED ORDER — CYCLOBENZAPRINE HCL 10 MG PO TABS
10.0000 mg | ORAL_TABLET | Freq: Three times a day (TID) | ORAL | Status: DC | PRN
Start: 2012-09-04 — End: 2012-09-07
  Administered 2012-09-04 (×2): 10 mg via ORAL
  Filled 2012-09-04 (×2): qty 1

## 2012-09-04 MED ORDER — LABETALOL HCL 5 MG/ML IV SOLN
20.0000 mg | Freq: Once | INTRAVENOUS | Status: AC
  Administered 2012-09-04: 20 mg via INTRAVENOUS

## 2012-09-04 MED ORDER — SODIUM CHLORIDE 0.9 % IV SOLN
INTRAVENOUS | Status: DC
  Administered 2012-09-04: 18:00:00 via INTRAVENOUS

## 2012-09-04 MED ORDER — ZOLPIDEM TARTRATE 5 MG PO TABS
5.0000 mg | ORAL_TABLET | Freq: Every evening | ORAL | Status: DC | PRN
  Administered 2012-09-04 – 2012-09-06 (×3): 5 mg via ORAL
  Filled 2012-09-04 (×4): qty 1

## 2012-09-04 MED ORDER — NALOXONE HCL 1 MG/ML IJ SOLN
1.0000 ug/kg/h | INTRAMUSCULAR | Status: DC | PRN
  Filled 2012-09-04: qty 2

## 2012-09-04 MED ORDER — INSULIN REGULAR BOLUS VIA INFUSION
0.0000 [IU] | Freq: Three times a day (TID) | INTRAVENOUS | Status: DC
  Filled 2012-09-04: qty 10

## 2012-09-04 MED ORDER — MISOPROSTOL 200 MCG PO TABS
ORAL_TABLET | ORAL | Status: AC
  Filled 2012-09-04: qty 1

## 2012-09-04 MED ORDER — ONDANSETRON HCL 4 MG PO TABS: 4.0000 mg | ORAL_TABLET | ORAL | Status: DC | PRN

## 2012-09-04 MED ORDER — SIMETHICONE 80 MG PO CHEW
80.0000 mg | CHEWABLE_TABLET | ORAL | Status: DC | PRN
  Administered 2012-09-05 (×2): 80 mg via ORAL

## 2012-09-04 MED ORDER — LABETALOL HCL 100 MG PO TABS
400.0000 mg | ORAL_TABLET | Freq: Three times a day (TID) | ORAL | Status: DC
  Administered 2012-09-04: 400 mg via ORAL
  Filled 2012-09-04 (×3): qty 4

## 2012-09-04 MED ORDER — FENTANYL CITRATE 0.05 MG/ML IJ SOLN
25.0000 ug | INTRAMUSCULAR | Status: AC | PRN
  Administered 2012-09-04 (×3): 25 ug via INTRAVENOUS

## 2012-09-04 MED ORDER — INSULIN GLARGINE 100 UNIT/ML ~~LOC~~ SOLN
20.0000 [IU] | Freq: Every day | SUBCUTANEOUS | Status: DC
  Administered 2012-09-04: 20 [IU] via SUBCUTANEOUS
  Filled 2012-09-04: qty 0.2

## 2012-09-04 MED ORDER — HYDRALAZINE HCL 20 MG/ML IJ SOLN
INTRAMUSCULAR | Status: AC
  Filled 2012-09-04: qty 1

## 2012-09-04 MED ORDER — ONDANSETRON HCL 4 MG/2ML IJ SOLN: 4.0000 mg | Freq: Three times a day (TID) | INTRAMUSCULAR | Status: DC | PRN

## 2012-09-04 MED ORDER — OXYTOCIN 10 UNIT/ML IJ SOLN
INTRAMUSCULAR | Status: AC
  Filled 2012-09-04: qty 3

## 2012-09-04 MED ORDER — SODIUM CHLORIDE 0.9 % IV SOLN
INTRAVENOUS | Status: DC
  Administered 2012-09-04: 1.7 [IU]/h via INTRAVENOUS
  Filled 2012-09-04: qty 1

## 2012-09-04 MED ORDER — PHENYLEPHRINE 40 MCG/ML (10ML) SYRINGE FOR IV PUSH (FOR BLOOD PRESSURE SUPPORT)
80.0000 ug | PREFILLED_SYRINGE | INTRAVENOUS | Status: DC | PRN
  Filled 2012-09-04: qty 5

## 2012-09-04 MED ORDER — LACTATED RINGERS IV SOLN: INTRAVENOUS | Status: DC

## 2012-09-04 MED ORDER — MORPHINE SULFATE 0.5 MG/ML IJ SOLN
INTRAMUSCULAR | Status: AC
  Filled 2012-09-04: qty 10

## 2012-09-04 MED ORDER — CARBOPROST TROMETHAMINE 250 MCG/ML IM SOLN
INTRAMUSCULAR | Status: AC
  Filled 2012-09-04: qty 1

## 2012-09-04 MED ORDER — WITCH HAZEL-GLYCERIN EX PADS: 1.0000 "application " | MEDICATED_PAD | CUTANEOUS | Status: DC | PRN

## 2012-09-04 MED ORDER — DEXTROSE-NACL 5-0.45 % IV SOLN: INTRAVENOUS | Status: DC

## 2012-09-04 SURGICAL SUPPLY — 37 items
APL SKNCLS STERI-STRIP NONHPOA (GAUZE/BANDAGES/DRESSINGS) ×1
BENZOIN TINCTURE PRP APPL 2/3 (GAUZE/BANDAGES/DRESSINGS) ×2 IMPLANT
CLOTH BEACON ORANGE TIMEOUT ST (SAFETY) ×2 IMPLANT
CONTAINER PREFILL 10% NBF 15ML (MISCELLANEOUS) IMPLANT
DRAIN JACKSON PRT FLT 7MM (DRAIN) IMPLANT
DRAPE LG THREE QUARTER DISP (DRAPES) ×2 IMPLANT
DRSG OPSITE 11X17.75 LRG (GAUZE/BANDAGES/DRESSINGS) ×2 IMPLANT
DRSG OPSITE POSTOP 4X10 (GAUZE/BANDAGES/DRESSINGS) IMPLANT
DURAPREP 26ML APPLICATOR (WOUND CARE) ×2 IMPLANT
ELECT REM PT RETURN 9FT ADLT (ELECTROSURGICAL) ×2
ELECTRODE REM PT RTRN 9FT ADLT (ELECTROSURGICAL) ×1 IMPLANT
EVACUATOR SILICONE 100CC (DRAIN) IMPLANT
EXTRACTOR VACUUM M CUP 4 TUBE (SUCTIONS) IMPLANT
GLOVE BIO SURGEON STRL SZ7 (GLOVE) ×2 IMPLANT
GLOVE BIOGEL PI IND STRL 7.0 (GLOVE) ×1 IMPLANT
GLOVE BIOGEL PI INDICATOR 7.0 (GLOVE) ×1
GOWN STRL REIN XL XLG (GOWN DISPOSABLE) ×6 IMPLANT
KIT ABG SYR 3ML LUER SLIP (SYRINGE) IMPLANT
NEEDLE HYPO 25X5/8 SAFETYGLIDE (NEEDLE) ×2 IMPLANT
NS IRRIG 1000ML POUR BTL (IV SOLUTION) ×2 IMPLANT
PACK C SECTION WH (CUSTOM PROCEDURE TRAY) ×2 IMPLANT
PAD ABD 7.5X8 STRL (GAUZE/BANDAGES/DRESSINGS) ×2 IMPLANT
PAD OB MATERNITY 4.3X12.25 (PERSONAL CARE ITEMS) ×2 IMPLANT
RETAINER VISCERAL (MISCELLANEOUS) ×2 IMPLANT
RTRCTR C-SECT PINK 25CM LRG (MISCELLANEOUS) ×2 IMPLANT
SLEEVE SCD COMPRESS KNEE MED (MISCELLANEOUS) IMPLANT
STAPLER VISISTAT 35W (STAPLE) IMPLANT
STRIP CLOSURE SKIN 1/2X4 (GAUZE/BANDAGES/DRESSINGS) ×2 IMPLANT
SUT MON AB 2-0 CT1 27 (SUTURE) ×2 IMPLANT
SUT PLAIN 2 0 XLH (SUTURE) ×2 IMPLANT
SUT VIC AB 0 CTX 36 (SUTURE) ×10
SUT VIC AB 0 CTX36XBRD ANBCTRL (SUTURE) ×5 IMPLANT
SUT VIC AB 4-0 KS 27 (SUTURE) ×2 IMPLANT
TAPE CLOTH SURG 4X10 WHT LF (GAUZE/BANDAGES/DRESSINGS) ×2 IMPLANT
TOWEL OR 17X24 6PK STRL BLUE (TOWEL DISPOSABLE) ×6 IMPLANT
TRAY FOLEY CATH 14FR (SET/KITS/TRAYS/PACK) IMPLANT
WATER STERILE IRR 1000ML POUR (IV SOLUTION) ×2 IMPLANT

## 2012-09-04 NOTE — Progress Notes (Signed)
Ceserean section discussed with pt by Dr. Emelda Fear

## 2012-09-04 NOTE — OR Nursing (Addendum)
Uterus massaged by S. Ad Guttman Charity fundraiser. Two tubes of cord blood sent to lab. Foley catheter in place upon arrival to OR. Urine color-  Tea colored.  .  3cc of blood evacuated from uterus during uterine massage.

## 2012-09-04 NOTE — Anesthesia Preprocedure Evaluation (Addendum)
Anesthesia Evaluation  Patient identified by MRN, date of birth, ID band Patient awake    Reviewed: Allergy & Precautions, H&P , NPO status , Patient's Chart, lab work & pertinent test results  Airway Mallampati: III TM Distance: >3 FB Neck ROM: Full    Dental  (+) Dental Advisory Given   Pulmonary neg pulmonary ROS,          Cardiovascular hypertension, Pt. on medications + Valvular Problems/Murmurs Rhythm:Regular Rate:Normal     Neuro/Psych negative neurological ROS  negative psych ROS   GI/Hepatic negative GI ROS, Neg liver ROS,   Endo/Other  diabetes, Type obesity  Renal/GU negative Renal ROS     Musculoskeletal negative musculoskeletal ROS (+)   Abdominal (+) + obese,   Peds  Hematology negative hematology ROS (+)   Anesthesia Other Findings   Reproductive/Obstetrics (+) Pregnancy                           Anesthesia Physical Anesthesia Plan  ASA: III and emergent  Anesthesia Plan: Epidural   Post-op Pain Management:    Induction:   Airway Management Planned:   Additional Equipment:   Intra-op Plan:   Post-operative Plan:   Informed Consent: I have reviewed the patients History and Physical, chart, labs and discussed the procedure including the risks, benefits and alternatives for the proposed anesthesia with the patient or authorized representative who has indicated his/her understanding and acceptance.     Plan Discussed with:   Anesthesia Plan Comments: (Existing labor epidural. Dosed to surgical level for csection)       Anesthesia Quick Evaluation

## 2012-09-04 NOTE — Anesthesia Procedure Notes (Signed)
Epidural Patient location during procedure: OB Start time: 09/04/2012 5:10 AM End time: 09/04/2012 5:45 AM  Staffing Anesthesiologist: Lewie Loron R Performed by: anesthesiologist   Preanesthetic Checklist Completed: patient identified, site marked, pre-op evaluation, timeout performed, IV checked, risks and benefits discussed and monitors and equipment checked  Epidural Patient position: sitting Prep: DuraPrep Patient monitoring: heart rate, continuous pulse ox and blood pressure Approach: midline Injection technique: LOR air and LOR saline  Needle:  Needle type: Tuohy  Needle gauge: 17 G Needle length: 9 cm Needle insertion depth: 9 cm Catheter type: closed end flexible Catheter size: 19 Gauge Catheter at skin depth: 15 cm Test dose: negative  Assessment Sensory level: T8 Events: blood not aspirated, injection not painful, no injection resistance, negative IV test and no paresthesia  Additional Notes Patient very difficult to keep positioned, moving constantly. FOB also became faint when I got LOR and patient sat up straight at that time. Reacquired LOR. Catheter threaded easily. Reason for block:at surgeon's request

## 2012-09-04 NOTE — Progress Notes (Signed)
CBG of 146 reported to K. Shaw,CNM along with high B/P's and interventions

## 2012-09-04 NOTE — Anesthesia Postprocedure Evaluation (Signed)
  Anesthesia Post-op Note  Patient: Melissa Braun  Procedure(s) Performed: Procedure(s) (LRB): Primary cesarean section with delivery of baby boy at 67.  Apgars 5/7. (N/A)  Patient Location: PACU  Anesthesia Type: Epidural  Level of Consciousness: awake and alert   Airway and Oxygen Therapy: Patient Spontanous Breathing  Post-op Pain: mild  Post-op Assessment: Post-op Vital signs reviewed, Patient's Cardiovascular Status Stable, Respiratory Function Stable, Patent Airway and No signs of Nausea or vomiting  Last Vitals:  Filed Vitals:   09/04/12 1045  BP: 146/82  Pulse: 76  Temp:   Resp: 18    Post-op Vital Signs: stable   Complications: No apparent anesthesia complications

## 2012-09-04 NOTE — Op Note (Signed)
Melissa Braun PROCEDURE DATE: 09/03/2012 - 09/04/2012  PREOPERATIVE DIAGNOSIS: Intrauterine pregnancy at  [redacted]w[redacted]d weeks gestation with pre eclampsia and non reassuring fetal heart tracing  POSTOPERATIVE DIAGNOSIS: The same  PROCEDURE:    Low Transverse Cesarean Section  SURGEON:  Dr. Elsie Lincoln  ASSISTANT: Dr. Napoleon Form  INDICATIONS: Melissa Braun is a 37 y.o. G1P1001 at [redacted]w[redacted]d with pre eclampsia and non reassuring fetal heart tracing.  The risks of cesarean section discussed with the patient included but were not limited to: bleeding which may require transfusion or reoperation; infection which may require antibiotics; injury to bowel, bladder, ureters or other surrounding organs; injury to the fetus; need for additional procedures including hysterectomy in the event of a life-threatening hemorrhage; placental abnormalities wth subsequent pregnancies, incisional problems, thromboembolic phenomenon and other postoperative/anesthesia complications. The patient concurred with the proposed plan, giving informed written consent for the procedure.    FINDINGS:  Viable female infant in cephalic presentation, Apgars 5 and 7 at one and five minutes. clear amniotic fluid.  Intact placenta, three vessel cord.  Grossly normal uterus, ovaries and fallopian tubes, small paratubal cyst on the left fallopian tube. .   ANESTHESIA:    Epidural  ESTIMATED BLOOD LOSS: 1000 ml  SPECIMENS: Placenta sent to pathology  COMPLICATIONS: None immediate  PROCEDURE IN DETAIL:  The patient received intravenous antibiotics and had sequential compression devices applied to her lower extremities while in the preoperative area.  She was then taken to the operating room where epidural anesthesia was dosed up to surgical level and was found to be adequate. She was then placed in a dorsal supine position with a leftward tilt, pannus taped to reveal the lower abdomen, and prepped and draped in a sterile manner.  A foley  catheter was in her bladder and attached to constant gravity.  After an adequate timeout was performed, a Pfannenstiel skin incision was made with scalpel and carried through to the underlying layer of fascia. The fascia was incised in the midline and this incision was extended bilaterally using the Mayo scissors. Kocher clamps were applied to the superior aspect of the fascial incision and the underlying rectus muscles were dissected off bluntly. A similar process was carried out on the inferior aspect of the facial incision. The rectus muscles were separated in the midline bluntly and the peritoneum was entered bluntly.   There was not adequate room to reach to lower uterine segment so the rectus muscles were partially transected.  A transverse hysterotomy was made with a scalpel and extended bilaterally bluntly. The bladder blade was then removed. The infant was successfully delivered, and cord was clamped and cut and infant was handed over to awaiting neonatology team. Uterine massage was then administered and the placenta delivered intact with three-vessel cord. The uterus was cleared of clot and debris.  The hysterotomy was closed with 0 vicryl.  A second imbricating suture of 0-Vicryl was used to reinforce the incision and aid in hemostasis.  A small paratubal cyst was removed from the left fallopian tubeThe peritoneum and rectus muscles were noted to be hemostatic.  The peritoneum was closed with 0-Vicryl in a running fashion.  The fascia was closed with 0-Vicryl in a running fashion with good restoration of anatomy.  The subcutaneus tissue was copiously irrigated and re appxoimated with 2-0 Vicryl.  The skin was closed with 4-0 Vicryl in a subcuticular fashion.  Pt tolerated the procedure will.  All counts were correct x2.  Pt went to the  recovery room in stable condition.

## 2012-09-04 NOTE — Transfer of Care (Signed)
Immediate Anesthesia Transfer of Care Note  Patient: Melissa Braun  Procedure(s) Performed: Procedure(s): Primary cesarean section with delivery of baby boy at 52.  Apgars 5/7. (N/A)  Patient Location: PACU  Anesthesia Type:Epidural  Level of Consciousness: awake, alert  and oriented  Airway & Oxygen Therapy: Patient Spontanous Breathing  Post-op Assessment: Report given to PACU RN and Post -op Vital signs reviewed and stable  Post vital signs: Reviewed and stable  Complications: No apparent anesthesia complications

## 2012-09-04 NOTE — Progress Notes (Signed)
Patient ID: Melissa Braun, female   DOB: 1975/06/09, 37 y.o.   MRN: [id number] Called to see pt for difficulty in tracing FHR along with variables. Pt had been turned and O2 applied. Placed pt on hands & knees with some improvement in the depth of variables- now to 90-100's. Given SQ terb to decrease frequency of ctx.  Pt to bathroom with urgency for BM (cx examined prior- 1cm/thick). After return from bathroom, FHR stable and foley bulb inserted with initial difficulty in inflation of balloon but with further advancement of catheter was able to inflate without difficulty. FHR remains at baseline with adq LTV. Ctx remain at q 2-4 mins despite Terb, but no further decels. Will continue to obs.  Cam Hai 09/04/2012 1:11 AM

## 2012-09-04 NOTE — Progress Notes (Signed)
Pt off monitors and in bathroom having another bowel movement.

## 2012-09-04 NOTE — Progress Notes (Signed)
Bed pan offered due to fhr decels pt refusing saying she has to get up and have a bowel movement.  Ok'd by K. Shaw,CNM

## 2012-09-04 NOTE — Addendum Note (Signed)
Addendum created 09/04/12 1700 by Angela Adam, CRNA   Modules edited: Anesthesia Medication Administration

## 2012-09-04 NOTE — Progress Notes (Signed)
Melissa Braun is a 37 y.o. G1P0000 at [redacted]w[redacted]d badmitted for induction of labor due to chronic htn, with superimposed preeclampsia., also DM class C     Subjective:   Called at 7 am to see pt due to 3rd episode of a brief run of  3 consecutive late decels, this time with decel to 80, lasting 40 sec. Beat to beat variability continues. Pt responded to slight position changes, baby has previously recovered when placed supine, tolerated better supine in past than on LL  or RL position.  Lates have now resolved. Pt still contracting from 9 pm cytotec but infrequently Given the unfavorable status of cervix and intermittent fetal intolerance of labor, cesarean section discussed , and recommended.  Pt reluctant to decide at this time, will discuss later with Dr Penne Lash.  Given current stable status, and resolution of decels , will  Proceed with induction of labor.  Pitocin to be started. Objective: BP 152/77  Pulse 78  Temp(Src) 97.5 F (36.4 C) (Axillary)  Resp 18  Ht 5\' 1"  (1.549 m)  Wt 230 lb (104.327 kg)  BMI 43.48 kg/m2  SpO2 100%  LMP 12/11/2011 I/O last 3 completed shifts: In: 4432.1 [P.O.:2340; I.V.:2092.1] Out: 4585 [Urine:4585]    FHT:  Variability 5-15 bpm, accels present decels resolved UC:   irregular, every 4-5 minutes SVE:   Dilation: 2 (foley bulb still in place) Effacement (%): 20 Station: -2 Exam by:: Dr. Emelda Fear station is -3 jvf Labs: Lab Results  Component Value Date   WBC 12.0* 09/04/2012   HGB 13.4 09/04/2012   HCT 37.9 09/04/2012   MCV 84.8 09/04/2012   PLT 128* 09/04/2012  Pt has type and screen  Assessment / Plan: Induction of labor due to gestational diabetes and preeclampsia superimposed on chronic htn,  progressing well on pitocin  Labor: will begin pitocin lo dose Preeclampsia:  on magnesium sulfate Fetal Wellbeing:  Cat I at present, previously cat II Pain Control:  Epidural I/D:  n/a Anticipated MOD:  undetermined  Melissa Braun V 09/04/2012,  7:32 AM

## 2012-09-04 NOTE — Progress Notes (Signed)
BERNETTE SEEMAN is a 37 y.o. G1P0000 at [redacted]w[redacted]d with preeclampsia.   Subjective:   Objective: BP 162/83  Pulse 79  Temp(Src) 97.6 F (36.4 C) (Oral)  Resp 20  Ht 5\' 1"  (1.549 m)  Wt 230 lb (104.327 kg)  BMI 43.48 kg/m2  SpO2 100%  LMP 12/11/2011 I/O last 3 completed shifts: In: 4432.1 [P.O.:2340; I.V.:2092.1] Out: 4585 [Urine:4585] Total I/O In: 100 [P.O.:25; I.V.:75] Out: 110 [Urine:110]  FHT:  FHR: 120 bpm, variability: moderate,  accelerations:  Present,  decelerations:  Present 3-4 lates at 7 am.  None since. UC:   irregular, every 5-10 minutes SVE:   Dilation: 2 (foley bulb still in place) Effacement (%): 20 Station: -2 Exam by:: Dr. Emelda Fear  Labs: Lab Results  Component Value Date   WBC 12.0* 09/04/2012   HGB 13.4 09/04/2012   HCT 37.9 09/04/2012   MCV 84.8 09/04/2012   PLT 128* 09/04/2012    Assessment / Plan: 37 yo G1P0 with preeclampsia Resume BP meds FHT reassuring with no decles for 2 hrs.  Will start low dose pitocin.  If decelerations continue then will stop induction and proceed with c/s.    Labor: starting pitocin Preeclampsia:  on magnesium sulfate Fetal Wellbeing:  Category I Pain Control:  Epidural I/D:  n/a Anticipated MOD:  NSVD  Makayle Krahn H. 09/04/2012, 8:44 AM

## 2012-09-04 NOTE — Progress Notes (Signed)
Patient ID: Melissa Braun, female   DOB: Aug 24, 1975, 37 y.o.   MRN: [id number]  Pt continues to have late declerations and variability is now minimal.  Foley bulb is still in place.  Will proceed with urgent primary c.s  The risks of cesarean section discussed with the patient included but were not limited to: bleeding which may require transfusion or reoperation; infection which may require antibiotics; injury to bowel, bladder, ureters or other surrounding organs; injury to the fetus; need for additional procedures including hysterectomy in the event of a life-threatening hemorrhage; placental abnormalities wth subsequent pregnancies, incisional problems, thromboembolic phenomenon and other postoperative/anesthesia complications. The patient concurred with the proposed plan, giving informed written consent for the procedure.   OR aware and will call for the pt when ready.

## 2012-09-04 NOTE — Progress Notes (Signed)
Melissa Braun is a 37 y.o. G1P0000 at [redacted]w[redacted]d badmitted for induction of labor due to chronic htn, with superimposed preeclampsia., also DM class C  Subjective: Contractions now q 3-5 min, in couplets, tolerated well Pain described as tolerable, no analgesic so far. BP 's to 180-190 /90's  Despite several doses IV labetolol, now given IV hydralazine x 10 mg with good response, now 150 systolic  Beginning to develop late decels with contractions, will try positon changes. Prior episode at 1 am resolved .     Objective: BP 158/82  Pulse 83  Temp(Src) 97.5 F (36.4 C) (Axillary)  Resp 18  Ht 5\' 1"  (1.549 m)  Wt 230 lb (104.327 kg)  BMI 43.48 kg/m2  SpO2 96%  LMP 12/11/2011 I/O last 3 completed shifts: In: 2962.1 [P.O.:1770; I.V.:1192.1] Out: 2775 [Urine:2775] Total I/O In: 1185 [P.O.:510; I.V.:675] Out: 1490 [Urine:1490] CBG (last 3)   Recent Labs  09/03/12 2318 09/04/12 0111 09/04/12 0304  GLUCAP 143* 118* 112*  cbc, repeated, plt stable at 128K. cmp repeated   FHT:  FHR: 150 bpm, variability: moderate,  accelerations:  Present,  decelerations:  Present intermittent lates now resolved by position changes UC:   regular, every 3-5 minutes SVE:   Dilation: 1 (foley bulb placed by K. Shaw,CNM.  Pt tolerates procedure we) Effacement (%): 20 Station: -2 Exam by:: Philipp Deputy, CNM at 1 am  Labs: Lab Results  Component Value Date   WBC 8.3 09/03/2012   HGB 14.0 09/03/2012   HCT 39.7 09/03/2012   MCV 85.0 09/03/2012   PLT 122* 09/03/2012    Assessment / Plan: Chtn with superimposed preeclampsia, latent phase labor  Labor: latent phase Preeclampsia:  on magnesium sulfate, no signs or symptoms of toxicity, labs stable and BP requiring repeated IV doses labetolol then Apresoline. Fetal Wellbeing:  Category II Pain Control:  Labor support without medications I/D:  n/a Anticipated MOD:  undetermined  Robby Pirani V 09/04/2012, 4:14 AM

## 2012-09-04 NOTE — Progress Notes (Signed)
Pt keeps going to the bathroom to have bowel movements.

## 2012-09-04 NOTE — Progress Notes (Signed)
Dr. Emelda Fear requesting Epidural for patient.

## 2012-09-04 NOTE — Progress Notes (Signed)
Medication given. See [facility]

## 2012-09-04 NOTE — Progress Notes (Signed)
Melissa Braun is a 37 y.o. G1P0000 at [redacted]w[redacted]d with pre eclampsia  Subjective: Necka nd shoulder pain, not repsonding to heating pad  Objective: BP 131/40  Pulse 80  Temp(Src) 97.6 F (36.4 C) (Oral)  Resp 18  Ht 5\' 1"  (1.549 m)  Wt 230 lb (104.327 kg)  BMI 43.48 kg/m2  SpO2 100%  LMP 12/11/2011 I/O last 3 completed shifts: In: 4432.1 [P.O.:2340; I.V.:2092.1] Out: 4585 [Urine:4585] Total I/O In: 100 [P.O.:25; I.V.:75] Out: 110 [Urine:110]  FHT:  FHR: 120 bpm, variability: moderate,  accelerations:  Present,  decelerations:  Present late in timing, goes to 10 beats below baseline lasting approx 30 seconds UC:   regular, every 3-4 minutes SVE:   Dilation: 4 Effacement (%): 70 Station: -2 Exam by:: foley bulb in place, Dr leggett  Labs: Lab Results  Component Value Date   WBC 12.0* 09/04/2012   HGB 13.4 09/04/2012   HCT 37.9 09/04/2012   MCV 84.8 09/04/2012   PLT 128* 09/04/2012    Assessment / Plan: Made servical change Will give flexeril for musculoskeletal pain Anesthesia to evaluate epidural Stop pitocin for now Will reposition. Category 2 tracing  Elsie Lincoln 09/04/2012, 9:55 AM

## 2012-09-05 LAB — CBC
MCH: 29.6 pg (ref 26.0–34.0)
MCHC: 34.7 g/dL (ref 30.0–36.0)
MCV: 85.3 fL (ref 78.0–100.0)
Platelets: 117 10*3/uL — ABNORMAL LOW (ref 150–400)
RDW: 13.8 % (ref 11.5–15.5)

## 2012-09-05 LAB — GLUCOSE, CAPILLARY
Glucose-Capillary: 100 mg/dL — ABNORMAL HIGH (ref 70–99)
Glucose-Capillary: 87 mg/dL (ref 70–99)
Glucose-Capillary: 40 mg/dL — CL (ref 70–99)
Glucose-Capillary: 179 mg/dL — ABNORMAL HIGH (ref 70–99)
Glucose-Capillary: 104 mg/dL — ABNORMAL HIGH (ref 70–99)

## 2012-09-05 MED ORDER — POTASSIUM CHLORIDE CRYS ER 20 MEQ PO TBCR
20.0000 meq | EXTENDED_RELEASE_TABLET | Freq: Two times a day (BID) | ORAL | Status: AC
  Administered 2012-09-05 – 2012-09-07 (×4): 20 meq via ORAL
  Filled 2012-09-05 (×4): qty 1

## 2012-09-05 MED ORDER — INSULIN GLARGINE 100 UNIT/ML ~~LOC~~ SOLN
15.0000 [IU] | Freq: Every day | SUBCUTANEOUS | Status: DC
  Administered 2012-09-05: 15 [IU] via SUBCUTANEOUS
  Filled 2012-09-05 (×2): qty 0.15

## 2012-09-05 MED ORDER — INSULIN ASPART 100 UNIT/ML ~~LOC~~ SOLN
6.0000 [IU] | Freq: Three times a day (TID) | SUBCUTANEOUS | Status: DC
  Administered 2012-09-05: 6 [IU] via SUBCUTANEOUS

## 2012-09-05 MED ORDER — FUROSEMIDE 20 MG PO TABS
20.0000 mg | ORAL_TABLET | Freq: Every day | ORAL | Status: AC
  Administered 2012-09-05 – 2012-09-06 (×2): 20 mg via ORAL
  Filled 2012-09-05 (×2): qty 1

## 2012-09-05 MED ORDER — GLUCOSE 40 % PO GEL: 1.0000 | ORAL | Status: DC | PRN

## 2012-09-05 MED ORDER — LABETALOL HCL 200 MG PO TABS
400.0000 mg | ORAL_TABLET | Freq: Two times a day (BID) | ORAL | Status: DC
  Administered 2012-09-05 – 2012-09-06 (×2): 400 mg via ORAL
  Filled 2012-09-05 (×2): qty 2

## 2012-09-05 NOTE — Progress Notes (Signed)
Hypoglycemic Event  CBG: 31  Treatment: 15 GM carbohydrate snack  Symptoms: Pale, Sweaty and slurred speech  Follow-up CBG: Time: 1245, 1300, 1300 CBG Result: 40, 63, 85  Possible Reasons for Event: Other: postpartum transition  Comments/MD notified: Dr. Emelda Fear present for event. See new orders to changes in scheduled Insulin.     Tia Alert A  Remember to initiate Hypoglycemia Order Set & complete

## 2012-09-05 NOTE — Progress Notes (Signed)
Patient ID: Melissa Braun, female   DOB: 1975/11/12, 37 y.o.   MRN: [id number] Subjective: Postpartum Day 1: Cesarean Delivery for NRFHT, Class C DM, Chronic Htn with Superimposed PreEclampsia Patient reports tolerating PO, + flatus and no problems voiding.  Denies headaches    Objective: Vital signs in last 24 hours: Temp:  [94.5 F (34.7 C)-98.4 F (36.9 C)] 97.4 F (36.3 C) (04/13 1125) Pulse Rate:  [71-94] 80 (04/13 1125) Resp:  [12-21] 18 (04/13 1125) BP: (107-186)/(59-117) 124/80 mmHg (04/13 1125) SpO2:  [92 %-100 %] 97 % (04/13 1125) Weight:  [87.272 kg (192 lb 6.4 oz)-109.181 kg (240 lb 11.2 oz)] 87.272 kg (192 lb 6.4 oz) (04/13 0600)  Physical Exam:  General: alert, delirious, no distress and marked edema. Lochia: appropriate Uterine Fundus: firm Incision: no significant drainage DVT Evaluation: No evidence of DVT seen on physical exam. Calf/Ankle edema is present. EDEMA EXTENDS TO UPPER THIGH   Recent Labs  09/04/12 0350 09/05/12 0601  HGB 13.4 11.5*  HCT 37.9 33.1*   CBC    Component Value Date/Time   WBC 13.7* 09/05/2012 0601   WBC 9.5 08/30/2012 0903   RBC 3.88 09/05/2012 0601   RBC 4.45 08/30/2012 0903   HGB 11.5* 09/05/2012 0601   HCT 33.1* 09/05/2012 0601   PLT 117* 09/05/2012 0601   MCV 85.3 09/05/2012 0601   MCH 29.6 09/05/2012 0601   MCH 29.4 08/30/2012 0903   MCHC 34.7 09/05/2012 0601   MCHC 33.9 08/30/2012 0903   RDW 13.8 09/05/2012 0601   RDW 13.6 08/30/2012 0903   LYMPHSABS 1.0 03/11/2012 0500   MONOABS 0.3 03/11/2012 0500   EOSABS 0.0 03/11/2012 0500   BASOSABS 0.0 03/11/2012 0500      Assessment/Plan: Status post Cesarean section. -> Routine care  Class C DM-> reduce end of pregnancy doses insulin by 50% and follow fasting/ 2hr PC values while in hospital  PreEclampsia:  Has completed 24 hrs Mag Sufate. Will D/c Mag sulfate and transfer out of ICU    Doing well postoperatively. Significant edema  .  Hilliard Borges V 09/05/2012, 11:53  AM

## 2012-09-06 ENCOUNTER — Encounter (HOSPITAL_COMMUNITY): Payer: Self-pay

## 2012-09-06 ENCOUNTER — Other Ambulatory Visit: Payer: 59

## 2012-09-06 LAB — BASIC METABOLIC PANEL
CO2: 21 mEq/L (ref 19–32)
Chloride: 105 mEq/L (ref 96–112)
Creatinine, Ser: 0.85 mg/dL (ref 0.50–1.10)
GFR calc Af Amer: >90 mL/min (ref >90)
Potassium: 4.4 mEq/L (ref 3.5–5.1)
Sodium: 136 mEq/L (ref 135–145)

## 2012-09-06 LAB — CBC
HCT: 32.4 % — ABNORMAL LOW (ref 36.0–46.0)
Hemoglobin: 11.2 g/dL — ABNORMAL LOW (ref 12.0–15.0)
MCV: 84.8 fL (ref 78.0–100.0)
RBC: 3.82 MIL/uL — ABNORMAL LOW (ref 3.87–5.11)
RDW: 14 % (ref 11.5–15.5)
WBC: 9.8 10*3/uL (ref 4.0–10.5)

## 2012-09-06 LAB — GLUCOSE, CAPILLARY
Glucose-Capillary: 66 mg/dL — ABNORMAL LOW (ref 70–99)
Glucose-Capillary: 106 mg/dL — ABNORMAL HIGH (ref 70–99)
Glucose-Capillary: 132 mg/dL — ABNORMAL HIGH (ref 70–99)

## 2012-09-06 MED ORDER — INSULIN ASPART 100 UNIT/ML ~~LOC~~ SOLN: 3.0000 [IU] | Freq: Three times a day (TID) | SUBCUTANEOUS | Status: DC

## 2012-09-06 MED ORDER — PNEUMOCOCCAL VAC POLYVALENT 25 MCG/0.5ML IJ INJ
0.5000 mL | INJECTION | INTRAMUSCULAR | Status: AC
  Administered 2012-09-07: 0.5 mL via INTRAMUSCULAR
  Filled 2012-09-06: qty 0.5

## 2012-09-06 MED ORDER — HYDROCHLOROTHIAZIDE 25 MG PO TABS
25.0000 mg | ORAL_TABLET | Freq: Every day | ORAL | Status: DC
  Administered 2012-09-06: 25 mg via ORAL
  Filled 2012-09-06 (×2): qty 1

## 2012-09-06 MED ORDER — ENALAPRIL MALEATE 20 MG PO TABS
20.0000 mg | ORAL_TABLET | Freq: Every day | ORAL | Status: DC
  Administered 2012-09-06: 20 mg via ORAL
  Filled 2012-09-06 (×2): qty 1

## 2012-09-06 MED ORDER — INSULIN ASPART 100 UNIT/ML ~~LOC~~ SOLN: 3.0000 [IU] | Freq: Three times a day (TID) | SUBCUTANEOUS | Status: DC | PRN

## 2012-09-06 MED ORDER — INSULIN ASPART 100 UNIT/ML ~~LOC~~ SOLN
3.0000 [IU] | Freq: Once | SUBCUTANEOUS | Status: AC
  Administered 2012-09-06: 3 [IU] via SUBCUTANEOUS

## 2012-09-06 MED ORDER — ENALAPRIL MALEATE 20 MG PO TABS: 20.0000 mg | ORAL_TABLET | Freq: Every day | ORAL | Status: DC

## 2012-09-06 MED ORDER — ENALAPRIL MALEATE 20 MG PO TABS
20.0000 mg | ORAL_TABLET | Freq: Every day | ORAL | Status: DC
  Administered 2012-09-06 – 2012-09-07 (×2): 20 mg via ORAL
  Filled 2012-09-06 (×2): qty 1

## 2012-09-06 NOTE — Progress Notes (Signed)
Nutrition: Pt with low CBG's and requiring snacks in between meals. Changed diet order to gestational CHO modified to allow snacks TID.  Pt provided with handout on diet for home, CHO modified.  Elisabeth Cara M.Odis Luster LDN Neonatal Nutrition Support Specialist Pager [phone]

## 2012-09-06 NOTE — Progress Notes (Signed)
Ur chart review completed.  

## 2012-09-06 NOTE — Progress Notes (Addendum)
Subjective: Postpartum Day 2: Cesarean Delivery Patient reports incisional pain, tolerating PO, + flatus and no problems voiding.    Had one episode of hypoglycemia before dinner yesterday.  Objective: Vital signs in last 24 hours: Temp:  [97.4 F (36.3 C)-98.5 F (36.9 C)] 98.5 F (36.9 C) (04/14 0555) Pulse Rate:  [71-89] 89 (04/14 0555) Resp:  [16-18] 18 (04/14 0555) BP: (116-163)/(65-85) 143/77 mmHg (04/14 0555) SpO2:  [97 %-99 %] 97 % (04/14 0555) Weight:  [246 lb (111.585 kg)-248 lb 12.8 oz (112.855 kg)] 246 lb (111.585 kg) (04/14 0559)  I/O:  - 2800 cc over last 24 hrs  FBS 52 this morniing (70 serum)  Physical Exam:  General: alert, cooperative and no distress Lochia: appropriate Uterine Fundus: firm Incision: healing well, no significant drainage DVT Evaluation: No evidence of DVT seen on physical exam.  2+ pedal edema   Recent Labs  09/05/12 0601 09/06/12 0610  HGB 11.5* 11.2*  HCT 33.1* 32.4*    Assessment/Plan: Status post Cesarean section. Doing well postoperatively.  Continue current care.  May want to go home today if baby can go.  Breast pumping  Will half breakfast insulin and discuss at rounds.   Melissa Braun 09/06/2012, 8:04 AM  Per Dr Penne Lash, will d/c Labetalol and start Enalapril 20mg  and HCTZ.  Will call Dr Talmage Nap and get specific insulin recommendations.

## 2012-09-06 NOTE — Anesthesia Postprocedure Evaluation (Signed)
Anesthesia Post Note  Patient: [name] [name]   Procedure(s) Performed: CLE/C/S  Anesthesia type: Epidural  Patient location: Mother/Baby  Post pain: Pain level controlled  Post assessment: Post-op Vital signs reviewed  Last Vitals: BP 135/76  Pulse 92  Temp(Src) 36.3 C (Oral)  Resp 18  Ht 5\' 1"  (1.549 m)  Wt 246 lb (111.585 kg)  BMI 46.51 kg/m2  SpO2 99%  LMP 12/11/2011  Post vital signs: Reviewed  Level of consciousness: awake  Complications: No apparent anesthesia complications

## 2012-09-06 NOTE — Progress Notes (Signed)
Spoke to [name]. Last appointment was April 2013. At that time her A1c was 10.6 and she was on Lantus 10 daily w/o mealtime coverage. Dr. Talmage Nap agrees w/ increased dose of Lantus to 15 units, and adding Novolog 3 units for mealtime coverage if glucose is greater than 150. Orders placed to reflect changes.   Lebatolol was discontinued in favor of Enalapril (DM renal protection) and HCTZ. BP currently 126/60 and has already received am dose of lasix and lebatolol. Will wait to give enalapril at 1200 today and HCTZ QHS today.   [name], MD Family Medicine PGY-2 09/06/2012, 9:48 AM

## 2012-09-06 NOTE — Progress Notes (Signed)
Patient fasting CBG 52.  Patient eating breakfast.  [name] CNM notified and orders received.

## 2012-09-06 NOTE — Progress Notes (Signed)
2 hour pp breakfast CBG 66.  Patient will have am snack.  Will continue to monitor CBG.

## 2012-09-07 DIAGNOSIS — O09299 Supervision of pregnancy with other poor reproductive or obstetric history, unspecified trimester: Secondary | ICD-10-CM

## 2012-09-07 DIAGNOSIS — O34219 Maternal care for unspecified type scar from previous cesarean delivery: Secondary | ICD-10-CM

## 2012-09-07 DIAGNOSIS — Z9889 Other specified postprocedural states: Secondary | ICD-10-CM

## 2012-09-07 LAB — TYPE AND SCREEN
ABO/RH(D): A POS
Antibody Screen: NEGATIVE
Unit division: 0

## 2012-09-07 LAB — GLUCOSE, CAPILLARY
Glucose-Capillary: 92 mg/dL (ref 70–99)
Glucose-Capillary: 91 mg/dL (ref 70–99)

## 2012-09-07 LAB — GC/CHLAMYDIA PROBE AMP: CT Probe RNA: NEGATIVE

## 2012-09-07 MED ORDER — LABETALOL HCL 100 MG PO TABS: 200.0000 mg | ORAL_TABLET | Freq: Two times a day (BID) | ORAL | Status: DC

## 2012-09-07 MED ORDER — DOCUSATE SODIUM 100 MG PO CAPS: 100.0000 mg | ORAL_CAPSULE | Freq: Two times a day (BID) | ORAL | Status: DC | PRN

## 2012-09-07 MED ORDER — HYDROCHLOROTHIAZIDE 25 MG PO TABS: 25.0000 mg | ORAL_TABLET | Freq: Every day | ORAL | Status: DC

## 2012-09-07 MED ORDER — LABETALOL HCL 200 MG PO TABS
400.0000 mg | ORAL_TABLET | Freq: Once | ORAL | Status: AC
  Administered 2012-09-07: 400 mg via ORAL
  Filled 2012-09-07: qty 2

## 2012-09-07 MED ORDER — FUROSEMIDE 10 MG/ML IJ SOLN
60.0000 mg | Freq: Once | INTRAMUSCULAR | Status: DC
  Filled 2012-09-07: qty 6

## 2012-09-07 MED ORDER — FUROSEMIDE 10 MG/ML IJ SOLN
40.0000 mg | Freq: Once | INTRAMUSCULAR | Status: AC
  Administered 2012-09-07: 40 mg via INTRAMUSCULAR
  Filled 2012-09-07: qty 4

## 2012-09-07 MED ORDER — IBUPROFEN 600 MG PO TABS: 600.0000 mg | ORAL_TABLET | Freq: Four times a day (QID) | ORAL | Status: DC

## 2012-09-07 MED ORDER — OXYCODONE-ACETAMINOPHEN 5-325 MG PO TABS: 1.0000 | ORAL_TABLET | ORAL | Status: DC | PRN

## 2012-09-07 MED ORDER — ENALAPRIL MALEATE 20 MG PO TABS: 20.0000 mg | ORAL_TABLET | Freq: Every day | ORAL | Status: DC

## 2012-09-07 MED ORDER — LABETALOL HCL 200 MG PO TABS
400.0000 mg | ORAL_TABLET | Freq: Two times a day (BID) | ORAL | Status: DC
  Filled 2012-09-07 (×3): qty 2

## 2012-09-07 NOTE — Discharge Summary (Signed)
Obstetric Discharge Summary Melissa Braun is a 37 y.o. G1P1001 presenting at [redacted]w[redacted]d for IOL for Class C diabetes, chronic HTN and preeclampsia. She was managed with MgSO4, cytotec, foley bulb and pitocin. However, she developed non-reassuring fetal heart tracings at approximately 4 cm dilation and was taken for urgent cesarean section.  Postpartum she received Magnesium for 24 hours. She diuresed well, aided by lasix, and she remained asymptomatic (no headache, vision changes or RUQ pain) except for significant edema in her lower extremities (to thighs) and abdomen. Her pressures remained above 160/90-100s on day of discharge on HCTZ 25 mg and 20 mg of Enalapril. She was transitioned from insulin drip postpartum to SubQ insulin but has several low blood sugars. Insulin was stopped and blood sugars on day of discharge were fair. She does not recall how much or what insulin she was on prior to pregnancy. Her baby continues in NICU with likely discharge today or in the next few days, and she is pumping, attempting to breastfeed and undecided regarding contraception.  Reason for Admission: induction of labor and Class C DM, chronic HTN with superimposed preeclampsia Prenatal Procedures: none Intrapartum Procedures: cesarean: low cervical, transverse and MagSO4 Postpartum Procedures: PP mag Complications-Operative and Postpartum: Elevated BP, hypoglycemia Hemoglobin  Date Value Range Status  09/06/2012 11.2* 12.0 - 15.0 g/dL Final     HCT  Date Value Range Status  09/06/2012 32.4* 36.0 - 46.0 % Final    Physical Exam:  General: alert, cooperative and no distress Lochia: appropriate Uterine Fundus: firm Incision: clean, dry, intact, no significant drainage, erythema or dehiscense DVT Evaluation: Legs with 3+ edema bilaterally, weeping but no blisters or redness. Very stiff but equal calf size bilaterally, mildly tender ABD:  Edema, Peau d'orange, some weeping. No redness.  Discharge Diagnoses:  Term Pregnancy-delivered, Preelampsia and Class C DM, chronic hypertension, s/p primary low transverse cesarean section  Discharge Information: Date: 09/07/2012 Activity: pelvic rest Diet: routine Medications: PNV, Ibuprofen, Colace, Percocet and HCTZ (25 mg), Enalapril (20 mg), Continue QID blood sugar checks and call PCP or [facility] for adjustment of DM meds/insulin Condition: stable Instructions: refer to practice specific booklet Discharge to: home and home health for BP check Follow-up Information   Follow up with [facility] In 4 weeks. (You should receive a call with appointment date and time. If not, please call number listed above.)    Contact information:   [address] [place] [address] [phone]      Follow up with Lonia Blood, MD. Schedule an appointment as soon as possible for a visit in 2 weeks. (For follow up of diabetes and blood pressure)    Contact information:   [address]  [place] [address] [phone]       Newborn Data: Live born female  Birth Weight: 7 lb 0.4 oz (3185 g) APGAR: 5, 7  Infant continues in NICU but home soon.  Napoleon Form 09/07/2012, 10:09 AM

## 2012-09-07 NOTE — Progress Notes (Signed)
Patient ID: Melissa Braun, female   DOB: [birth year], 37 y.o.   MRN: [id number] I was called to see pt for persistently elevated BP's 160's- 180's/90's in a pt scheduled for discharge.  The pt is without complaints.  She denies HA, changes in vision or abdominal pain.  She is quite pleasant and in NAD.  She has 3-4+ pitting edema to her thighs which she reports is new this pregnancy but, imporved since delivery.  On exam she is NOT hyperreflexic.  Plan: pt given Labetolol 400mg  and her BP improved to 140's. After extensive discusion with pt.  She will be discharged to home but, will room in with infant overnight.  She has an appt with her primary care physicain on tomorrow at 4pm.  She will also receive another dose of Lasix prior to her discharge.    Her nurse was instructed to contact me if there is a change in her clinical status prior to her discharge. Pt agrees with plan of care.  Avrian Delfavero L. Harraway-Smith, M.D., Evern Core

## 2012-09-07 NOTE — Progress Notes (Signed)
Pt discharged... Pt "rooming in" down in NICU with son who will be discharged tomorrow... Condition stable... No equipment... Ambulated to NICU.

## 2012-09-08 NOTE — Discharge Summary (Signed)
Agree with above note.  Melissa Braun H. 09/08/2012 9:22 AM

## 2012-09-09 ENCOUNTER — Inpatient Hospital Stay (HOSPITAL_COMMUNITY): Admission: RE | Admit: 2012-09-09 | Payer: 59 | Source: Ambulatory Visit

## 2012-09-09 NOTE — H&P (Signed)
H&P   Melissa Braun (MR# [id number])       H&P Info    Author    Jacklyn Shell, CNM    Note Status: Signed    Last Update User: Jacklyn Shell, CNM    Last Update Date/Time: 09/03/2012  8:14 AM      H&P    Melissa Braun is a 37 y.o. female presenting for IOL s/s preeclampsia. Maternal Medical History:   Prenatal complications: Pre-eclampsia.   Prenatal Complications - Diabetes: type 2. Diabetes is managed by insulin pump and insulin injections.     Chronic HTN:  Had been on meds in the past; restarted at [redacted] weeks pregnant There were no vitals filed for this visit.   There were no vitals filed for this visit.    OB History     Grav  Para  Term  Preterm  Abortions  TAB  SAB  Ect  Mult  Living     1                            Past Medical History   Diagnosis  Date   .  Diabetes mellitus  AGE 78       DR Melissa Braun   .  Infertility     .  Acne     .  LGSIL (low grade squamous intraepithelial dysplasia)  2008   .  Sickle cell trait     .  Heart murmur     .  Hypertension         past h/o elevated BP with taking Tylenol Sinus   .  Abnormal Pap smear     .  Pregnancy induced hypertension         Past Surgical History   Procedure  Laterality  Date   .  Exploratory laparoscopy           DUE TO RLQ PAIN   .  Gastric bypass    nov. 2011       Melissa Braun      Family History: family history includes Anemia in her mother; Cancer in her father; Diabetes in her father; Hypertension in her father and sister; Sickle cell anemia in her mother; and Sickle cell trait in her sister. Social History:  reports that she has never smoked. She has never used smokeless tobacco. She reports that she does not drink alcohol or use illicit drugs.      Prenatal Transfer Tool    Maternal Diabetes: Yes:  Diabetes Type:  Pre-pregnancy, Insulin/Medication controlled Genetic Screening: Normal Maternal Ultrasounds/Referrals: Normal Fetal Ultrasounds or other  Referrals:  Referred to Materal Fetal Medicine ;  EFW 58% 08/20/12 Maternal Substance Abuse:  No Significant Maternal Medications:  Meds include: Other:  Significant Maternal Lab Results:  Lab values include: Group B Strep negative Other Comments:     ROS Review of Systems  Constitutional: Negative for fever, chills, weight loss, malaise/fatigue and diaphoresis.  HENT: Negative for hearing loss, ear pain, nosebleeds, congestion, sore throat, neck pain, tinnitus and ear discharge.   Eyes: Negative for blurred vision, double vision, photophobia, pain, discharge and redness.  Respiratory: Negative for cough, hemoptysis, sputum production, shortness of breath, wheezing and stridor.   Cardiovascular: Negative for chest pain, palpitations, orthopnea, claudication, leg swelling and PND.  Gastrointestinal: . Negative for abdominal pain, heartburn, nausea, vomiting, diarrhea, constipation, blood in stool and melena.  Genitourinary: Negative for dysuria, urgency,  frequency, hematuria and flank pain.  Musculoskeletal: Negative for myalgias, back pain, joint pain and falls.  Skin: Negative for itching and rash.  Neurological: Negative for dizziness, tingling, tremors, sensory change, speech change, focal weakness, seizures, loss of consciousness, weakness and headaches.  Endo/Heme/Allergies: Negative for environmental allergies and polydipsia. Does not bruise/bleed easily.  Psychiatric/Behavioral: Negative for depression, suicidal ideas, hallucinations, memory loss and substance abuse. The patient is not nervous/anxious and does not have insomnia.           Last menstrual period 12/11/2011. Maternal Exam:  Abdomen: Fetal presentation: vertex   Cervix: Cervix evaluated by digital exam.      Fetal Exam Fetal Monitor Review: Baseline rate: 140.  Variability: moderate (6-25 bpm).   Pattern: accelerations present and no decelerations.     Fetal State Assessment: Category I - tracings are  normal.       Physical Exam  Vitals reviewed. Constitutional: She is oriented to person, place, and time. She appears well-developed and well-nourished.  Eyes: Pupils are equal, round, and reactive to light.  Cardiovascular: Normal rate and regular rhythm.   Respiratory: Effort normal and breath sounds normal.  GI: Soft. There is no tenderness.  Genitourinary: Vagina normal.  Musculoskeletal: Normal range of motion.  Neurological: She is alert and oriented to person, place, and time. She has normal reflexes.   Reflex Scores:      Patellar reflexes are 2+ on the right side and 2+ on the left side. Skin: Skin is warm and dry.  Psychiatric: Her behavior is normal.   4+ edema lower extremities Prenatal labs: ABO, Rh: A/--/-- (10/07 0755) Antibody: Negative (10/07 0755) Rubella:  immune RPR: NON REAC (01/20 0834)   HBsAg: Negative (10/07 0755)   HIV: NON REACTIVE (01/20 0844)   GBS:      Assessment/Plan: IOL for preeclampsia Cytotec MgSO4 (severe range b/p)        Melissa Braun 09/03/2012, 12:08 AM

## 2012-09-14 ENCOUNTER — Encounter: Payer: Self-pay | Admitting: Family Medicine

## 2012-09-17 ENCOUNTER — Telehealth: Payer: Self-pay

## 2012-09-17 NOTE — Telephone Encounter (Signed)
Patient called in wanting to know if her medical records had been sent to her benefits company to verify her FMLA.  States that a request was faxed here but they have not received the records.  The fax number the records need to be sent to is [phone].  The phone number in case we need to contact them is [phone].

## 2012-09-22 NOTE — Telephone Encounter (Signed)
Called pt and informed pt that her forms would be completed by the end of the day tomorrow. Pt stated understanding and did not have any further questions.

## 2012-10-07 ENCOUNTER — Other Ambulatory Visit: Payer: Self-pay | Admitting: Obstetrics & Gynecology

## 2012-10-07 ENCOUNTER — Ambulatory Visit (INDEPENDENT_AMBULATORY_CARE_PROVIDER_SITE_OTHER): Payer: 59 | Admitting: Obstetrics & Gynecology

## 2012-10-07 ENCOUNTER — Encounter: Payer: Self-pay | Admitting: Obstetrics & Gynecology

## 2012-10-07 DIAGNOSIS — Z30430 Encounter for insertion of intrauterine contraceptive device: Secondary | ICD-10-CM

## 2012-10-07 DIAGNOSIS — Z01812 Encounter for preprocedural laboratory examination: Secondary | ICD-10-CM

## 2012-10-07 LAB — POCT URINALYSIS DIP (DEVICE)
Glucose, UA: 500 mg/dL — AB
Nitrite: NEGATIVE
Specific Gravity, Urine: 1.015 (ref 1.005–1.030)
Urobilinogen, UA: 1 mg/dL (ref 0.0–1.0)
pH: 5.5 (ref 5.0–8.0)

## 2012-10-07 MED ORDER — LEVONORGESTREL 20 MCG/24HR IU IUD
INTRAUTERINE_SYSTEM | Freq: Once | INTRAUTERINE | Status: AC
  Administered 2012-10-07: 15:00:00 via INTRAUTERINE

## 2012-10-07 NOTE — Progress Notes (Signed)
Patient ID: Melissa Braun, female   DOB: [birth year], 37 y.o.   MRN: [id number] Subjective:     Melissa Braun is a 37 y.o. female who presents for a postpartum visit. She is 5 weeks postpartum following a low cervical transverse Cesarean section. I have fully reviewed the prenatal and intrapartum course. The delivery was at 39 gestational weeks. Outcome: primary cesarean section, low transverse incision. Anesthesia: epidural. Postpartum course has been good . Baby's course has been normal. Baby is feeding by both breast and bottle -  . Bleeding staining only. Bowel function is normal. Bladder function is normal. Patient is not sexually active. Contraception method is IUD. Postpartum depression screening: negative.  The following portions of the patient's history were reviewed and updated as appropriate: allergies, current medications, past family history, past medical history, past social history, past surgical history and problem list.  Review of Systems Musculoskeletal:positive for pedal edema   Objective:    BP 126/78  Pulse 91  Temp(Src) 96.8 F (36 C) (Oral)  Ht 5\' 1"  (1.549 m)  Wt 184 lb (83.462 kg)  BMI 34.78 kg/m2  Breastfeeding? No  General:  alert, cooperative and no distress   Breasts:     Lungs:    Heart:     Abdomen: soft, non-tender; bowel sounds normal; no masses,  no organomegaly and incison good   Vulva:  normal  Vagina: normal vagina  Cervix:  dark clot at os removed  Corpus: normal  Adnexa:  not evaluated  Rectal Exam: Not performed.        Assessment:     5 week  postpartum exam. Pap smear not done at today's visit.   Plan:    1. Contraception: IUD 2. Mirena today 3. Follow up in: 4 weeks or as needed.  4. F/U PCP re BP meds, DM, swelling  Adam Phenix, MD 10/07/2012

## 2012-10-07 NOTE — Progress Notes (Signed)
Patient ID: Melissa Braun, female   DOB: [birth year], 37 y.o.   MRN: [id number] Patient identified, informed consent performed, signed copy in chart, time out was performed.  Urine pregnancy test negative.  Speculum placed in the vagina.  Cervix visualized.  Cleaned with Betadine x 2.  Grasped anteriourly with a single tooth tenaculum.  Uterus sounded to 7.5.  Mirena IUD placed per manufacturer's recommendations.  Strings trimmed to 3 cm.   Patient given post procedure instructions and Mirena care card with expiration date.  Patient is asked to check IUD strings periodically and follow up in 4-6 weeks for IUD check.  10/07/2012 Adam Phenix, MD

## 2012-10-11 ENCOUNTER — Encounter: Payer: Self-pay | Admitting: *Deleted

## 2012-10-19 ENCOUNTER — Encounter: Payer: Self-pay | Admitting: *Deleted

## 2012-10-19 ENCOUNTER — Telehealth: Payer: Self-pay | Admitting: *Deleted

## 2012-10-19 NOTE — Telephone Encounter (Signed)
Pt left message requesting a letter to return to work. She is on disability and scheduled to return to work on 10/29/12.  Pt was notified that she may pick up her letter anytime after 12:30 pm today. She voiced understanding.

## 2012-11-04 ENCOUNTER — Encounter: Payer: Self-pay | Admitting: Obstetrics & Gynecology

## 2012-11-04 ENCOUNTER — Ambulatory Visit (INDEPENDENT_AMBULATORY_CARE_PROVIDER_SITE_OTHER): Payer: 59 | Admitting: Obstetrics & Gynecology

## 2012-11-04 VITALS — BP 117/75 | HR 94 | Temp 98.5°F | Ht 64.0 in | Wt 185.8 lb

## 2012-11-04 DIAGNOSIS — Z30431 Encounter for routine checking of intrauterine contraceptive device: Secondary | ICD-10-CM

## 2012-11-04 NOTE — Patient Instructions (Addendum)
Levonorgestrel intrauterine device (IUD) What is this medicine? LEVONORGESTREL IUD (LEE voe nor jes trel) is a contraceptive (birth control) device. The device is placed inside the uterus by a healthcare professional. It is used to prevent pregnancy and can also be used to treat heavy bleeding that occurs during your period. Depending on the device, it can be used for 3 to 5 years. This medicine may be used for other purposes; ask your health care provider or pharmacist if you have questions. What should I tell my health care provider before I take this medicine? They need to know if you have any of these conditions: -abnormal Pap smear -cancer of the breast, uterus, or cervix -diabetes -endometritis -genital or pelvic infection now or in the past -have more than one sexual partner or your partner has more than one partner -heart disease -history of an ectopic or tubal pregnancy -immune system problems -IUD in place -liver disease or tumor -problems with blood clots or take blood-thinners -use intravenous drugs -uterus of unusual shape -vaginal bleeding that has not been explained -an unusual or allergic reaction to levonorgestrel, other hormones, silicone, or polyethylene, medicines, foods, dyes, or preservatives -pregnant or trying to get pregnant -breast-feeding How should I use this medicine? This device is placed inside the uterus by a health care professional. Talk to your pediatrician regarding the use of this medicine in children. Special care may be needed. Overdosage: If you think you have taken too much of this medicine contact a poison control center or emergency room at once. NOTE: This medicine is only for you. Do not share this medicine with others. What if I miss a dose? This does not apply. What may interact with this medicine? Do not take this medicine with any of the following medications: -amprenavir -bosentan -fosamprenavir This medicine may also interact with  the following medications: -aprepitant -barbiturate medicines for inducing sleep or treating seizures -bexarotene -griseofulvin -medicines to treat seizures like carbamazepine, ethotoin, felbamate, oxcarbazepine, phenytoin, topiramate -modafinil -pioglitazone -rifabutin -rifampin -rifapentine -some medicines to treat HIV infection like atazanavir, indinavir, lopinavir, nelfinavir, tipranavir, ritonavir -St. John's wort -warfarin This list may not describe all possible interactions. Give your health care provider a list of all the medicines, herbs, non-prescription drugs, or dietary supplements you use. Also tell them if you smoke, drink alcohol, or use illegal drugs. Some items may interact with your medicine. What should I watch for while using this medicine? Visit your doctor or health care professional for regular check ups. See your doctor if you or your partner has sexual contact with others, becomes HIV positive, or gets a sexual transmitted disease. This product does not protect you against HIV infection (AIDS) or other sexually transmitted diseases. You can check the placement of the IUD yourself by reaching up to the top of your vagina with clean fingers to feel the threads. Do not pull on the threads. It is a good habit to check placement after each menstrual period. Call your doctor right away if you feel more of the IUD than just the threads or if you cannot feel the threads at all. The IUD may come out by itself. You may become pregnant if the device comes out. If you notice that the IUD has come out use a backup birth control method like condoms and call your health care provider. Using tampons will not change the position of the IUD and are okay to use during your period. What side effects may I notice from receiving this medicine?   Side effects that you should report to your doctor or health care professional as soon as possible: -allergic reactions like skin rash, itching or  hives, swelling of the face, lips, or tongue -fever, flu-like symptoms -genital sores -high blood pressure -no menstrual period for 6 weeks during use -pain, swelling, warmth in the leg -pelvic pain or tenderness -severe or sudden headache -signs of pregnancy -stomach cramping -sudden shortness of breath -trouble with balance, talking, or walking -unusual vaginal bleeding, discharge -yellowing of the eyes or skin Side effects that usually do not require medical attention (report to your doctor or health care professional if they continue or are bothersome): -acne -breast pain -change in sex drive or performance -changes in weight -cramping, dizziness, or faintness while the device is being inserted -headache -irregular menstrual bleeding within first 3 to 6 months of use -nausea This list may not describe all possible side effects. Call your doctor for medical advice about side effects. You may report side effects to FDA at 1-800-FDA-1088. Where should I keep my medicine? This does not apply. NOTE: This sheet is a summary. It may not cover all possible information. If you have questions about this medicine, talk to your doctor, pharmacist, or health care provider.  2013, [employer]. (06/12/2011 1:54:04 PM)  

## 2012-11-04 NOTE — Progress Notes (Signed)
  Subjective:    Patient ID: Melissa Braun, female    DOB: [birth year], 37 y.o.   MRN: [id number]  HPI Patient's last menstrual period was 10/13/2012. G1P1001 String check after Mirena placement 4 weeks ago. Some irregularbleeding    Review of Systems  Constitutional: Negative for fever.  Genitourinary: Positive for vaginal bleeding. Negative for vaginal discharge and pelvic pain.       Objective:   Physical Exam  Genitourinary: Vagina normal and uterus normal. No vaginal discharge found.  Scant bleeding, string 2-3 cm  Skin: Skin is warm and dry. No pallor.  Psychiatric: She has a normal mood and affect. Her behavior is normal.          Assessment & Plan:  Doing well with some spotting after Mirena, Report increased pain, Bleeding  Adam Phenix, MD 11/04/2012

## 2012-12-17 ENCOUNTER — Telehealth: Payer: Self-pay | Admitting: General Practice

## 2012-12-17 NOTE — Telephone Encounter (Signed)
Received message from [facility] requesting refill on Labetalol. Per chart review patient was informed she needed to follow up with her PCP for management of her blood pressure and medication. Patient needs to be reminded to follow up there. Called patient, no answer- left message stating we are trying to reach her about a refill request and if she could give [place] a call back at the clinics

## 2012-12-17 NOTE — Telephone Encounter (Signed)
Patient called back returning our phone call. Told patient we are trying to contact her because we got a letter from her [facility] that she was requesting a refill. Patient stated she went to her PCP yesterday and they refilled her medications so we can disregard that. Patient had no further questions

## 2013-11-01 ENCOUNTER — Encounter (INDEPENDENT_AMBULATORY_CARE_PROVIDER_SITE_OTHER): Payer: Self-pay | Admitting: Ophthalmology

## 2013-12-16 ENCOUNTER — Encounter: Payer: Self-pay | Admitting: Women's Health

## 2013-12-16 ENCOUNTER — Other Ambulatory Visit (HOSPITAL_COMMUNITY)
Admission: RE | Admit: 2013-12-16 | Discharge: 2013-12-16 | Disposition: A | Discharge status: Home / Self Care | Payer: 59 | Source: Ambulatory Visit | Attending: Women's Health | Admitting: Women's Health

## 2013-12-16 ENCOUNTER — Ambulatory Visit (INDEPENDENT_AMBULATORY_CARE_PROVIDER_SITE_OTHER): Payer: 59 | Admitting: Women's Health

## 2013-12-16 VITALS — BP 130/92 | Ht 62.0 in | Wt 199.8 lb

## 2013-12-16 DIAGNOSIS — E108 Type 1 diabetes mellitus with unspecified complications: Secondary | ICD-10-CM

## 2013-12-16 DIAGNOSIS — Z01419 Encounter for gynecological examination (general) (routine) without abnormal findings: Secondary | ICD-10-CM | POA: Insufficient documentation

## 2013-12-16 DIAGNOSIS — IMO0002 Reserved for concepts with insufficient information to code with codable children: Secondary | ICD-10-CM

## 2013-12-16 DIAGNOSIS — E1065 Type 1 diabetes mellitus with hyperglycemia: Secondary | ICD-10-CM

## 2013-12-16 DIAGNOSIS — Z1151 Encounter for screening for human papillomavirus (HPV): Secondary | ICD-10-CM | POA: Insufficient documentation

## 2013-12-16 NOTE — Progress Notes (Signed)
Cyndie ChimeShannon F Witman 09/26/1975 [id number]  History:    Presents for annual exam.  Light monthly 3-4 day cycle/Mirena IUD 10/07/2012. Normal Pap history. Type 1 diabetes age 38. History of infertility, had a spontaneous pregnancy 2013 complicated by preeclampsia, delivered a healthy son.   Past medical history, past surgical history, family history and social history were all reviewed and documented in the EPIC chart. Adopted children ages 664, 343, biologic son 15 months. History of gastric bypass 2011. Works at [employer].  ROS:  A  12 point ROS was performed and pertinent positives and negatives are included.  Exam:  Filed Vitals:   12/16/13 1601  BP: 130/92    General appearance:  Normal Thyroid:  Symmetrical, normal in size, without palpable masses or nodularity. Respiratory  Auscultation:  Clear without wheezing or rhonchi Cardiovascular  Auscultation:  Regular rate, without rubs, murmurs or gallops  Edema/varicosities:  Not grossly evident Abdominal  Soft,nontender, without masses, guarding or rebound.  Liver/spleen:  No organomegaly noted  Hernia:  None appreciated  Skin  Inspection:  Grossly normal   Breasts: Examined lying and sitting.     Right: Without masses, retractions, discharge or axillary adenopathy.     Left: Without masses, retractions, discharge or axillary adenopathy. Gentitourinary   Inguinal/mons:  Normal without inguinal adenopathy  External genitalia:  Normal  BUS/Urethra/Skene's glands:  Normal  Vagina:  Normal  Cervix:  Normal IUD string in os  Uterus:   normal in size, shape and contour.  Midline and mobile  Adnexa/parametria:     Rt: Without masses or tenderness.   Lt: Without masses or tenderness.  Anus and perineum: Normal  Digital rectal exam: Normal sphincter tone without palpated masses or tenderness  Assessment/Plan:  38 y.o. MBF G1P1 for annual exam with no complaints.  Normal GYN exam Mirena IUD 09/2012 light monthly cycle Obesity history of  gastric bypass Type 1 diabetes/hypertension has followup scheduled with primary care  Plan: Hemoglobin A1c, will take results to primary care followup. SBE's, increase regular exercise and decrease calories for weight loss, multivitamin daily, Pap with HR HPV typing, new screening guidelines reviewed.   Note: This dictation was prepared with Dragon/digital dictation.  Any transcriptional errors that result are unintentional. Harrington ChallengerYOUNG,Monik Lins J [facility] FloridaWHNP, 4:31 PM 12/16/2013

## 2013-12-16 NOTE — Patient Instructions (Signed)

## 2013-12-20 LAB — CYTOLOGY - PAP

## 2013-12-22 ENCOUNTER — Encounter: Payer: Self-pay | Admitting: Gynecology

## 2014-01-15 IMAGING — US US OB FOLLOW-UP
1 series · 12 of 28 positions shown · non-contrast
Comparison: none

[Series 1: us ob follow-up · 0.23mm/px · 12 of 44 slices shown]
[im 2/44]
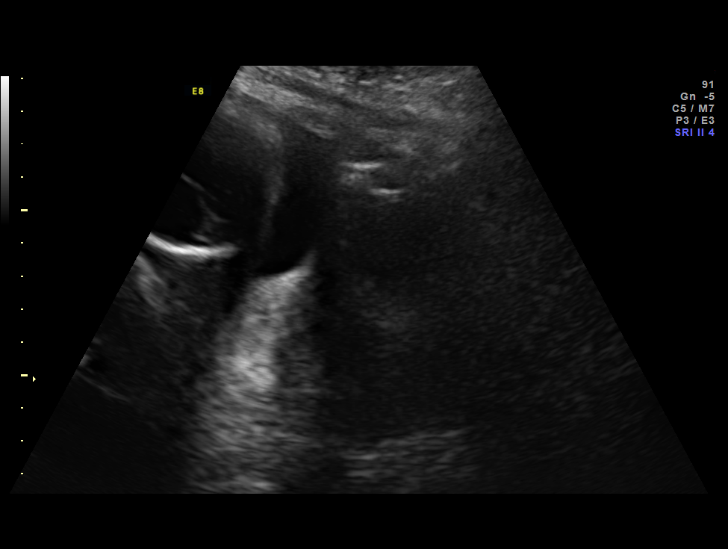
[im 5/44]
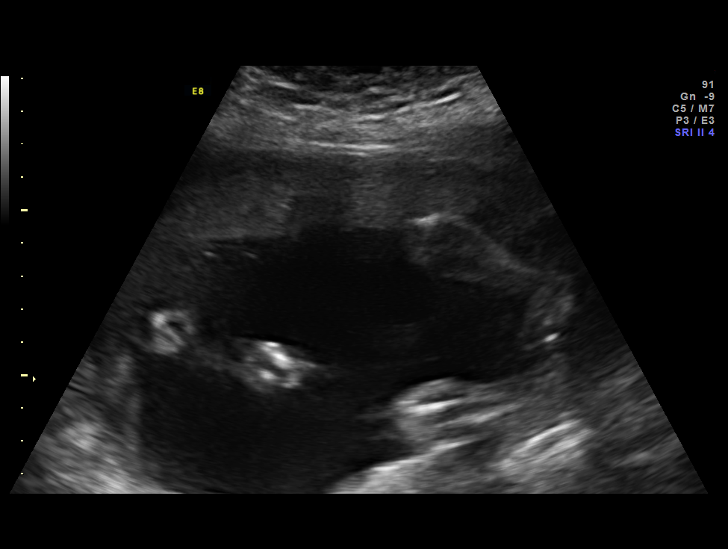
[im 8/44]
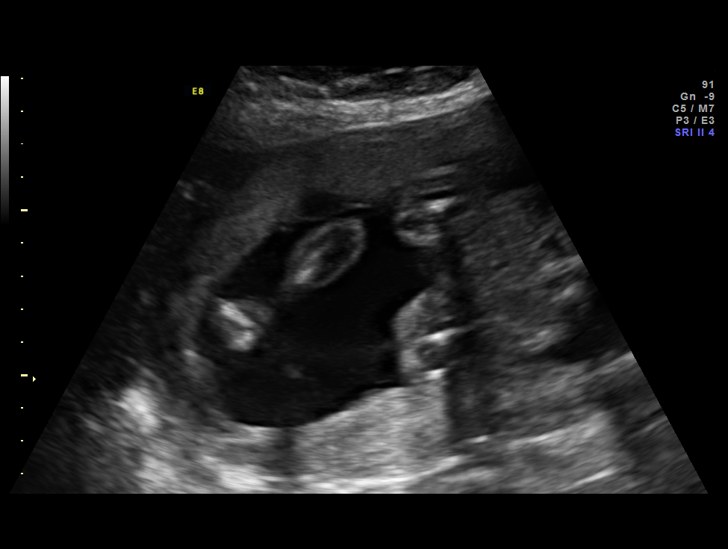
[im 13/44]
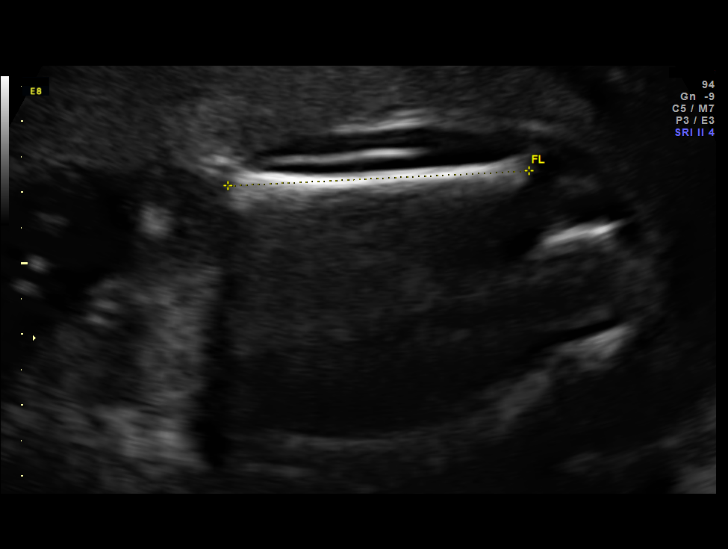
[im 16/44]
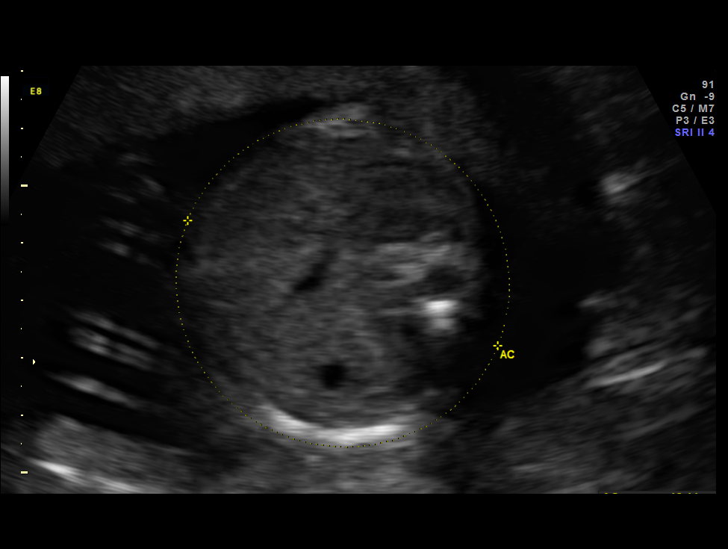
[im 20/44]
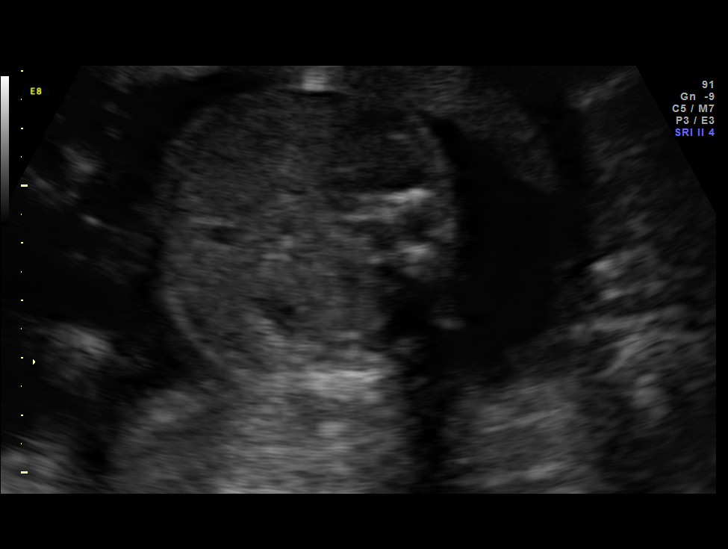
[im 24/44]
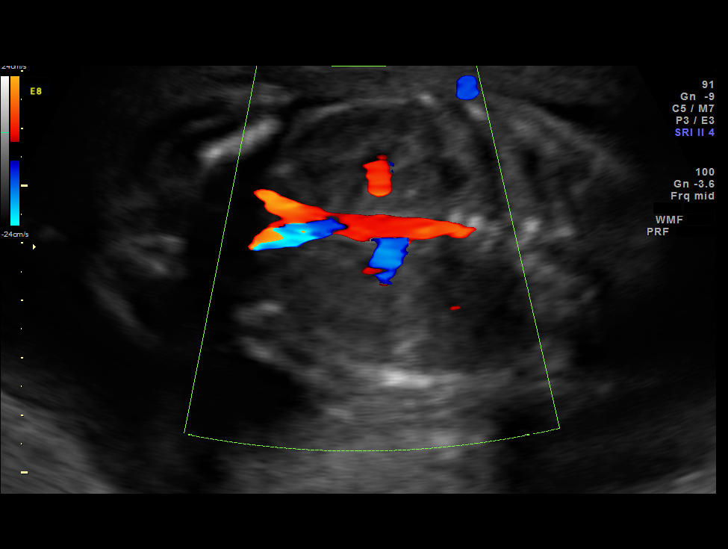
[im 28/44]
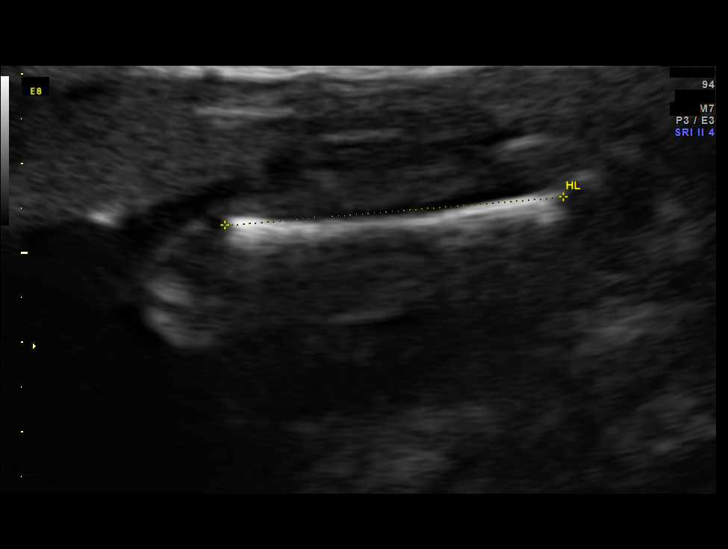
[im 31/44]
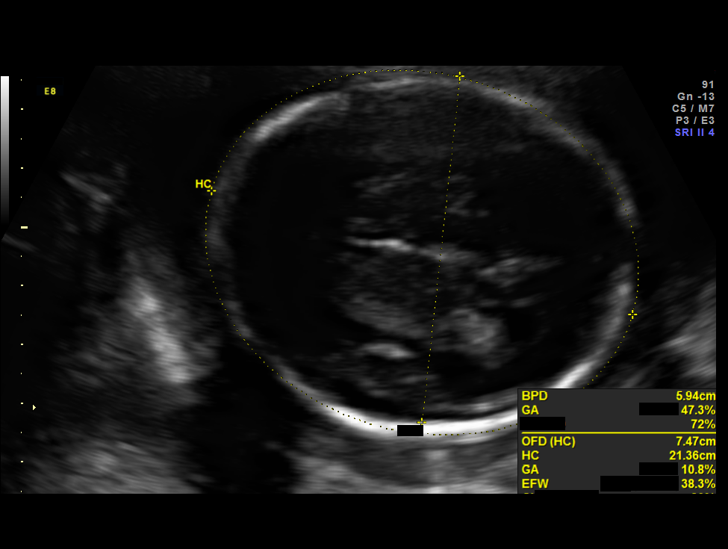
[im 36/44]
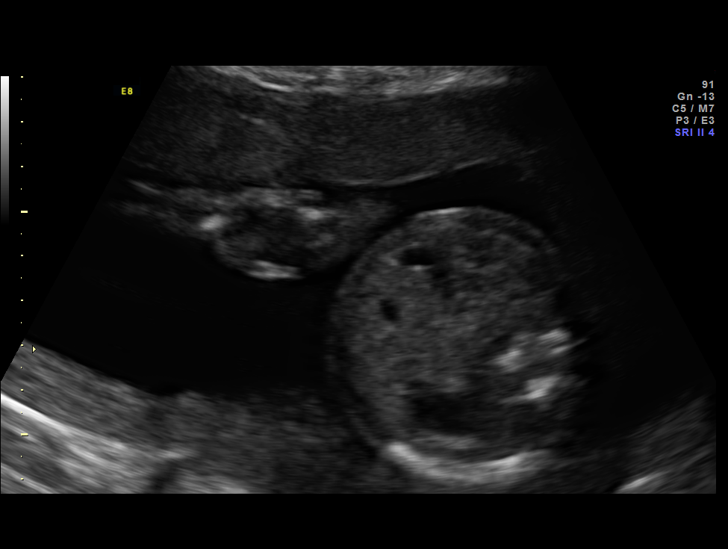
[im 39/44]
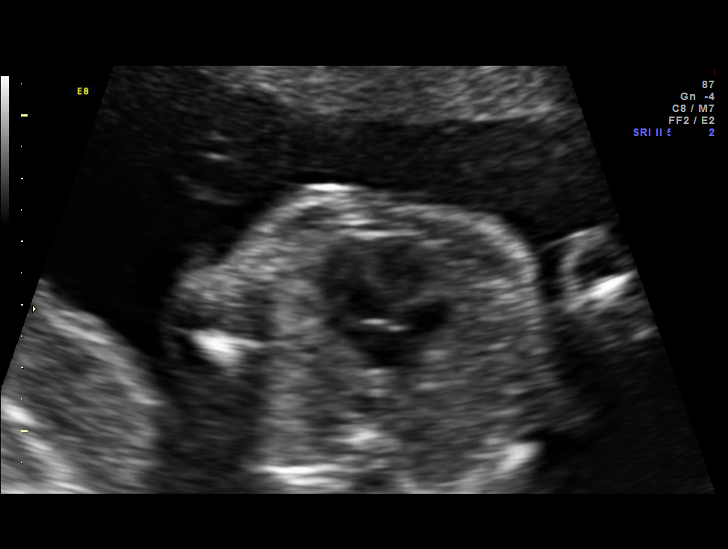
[im 42/44]
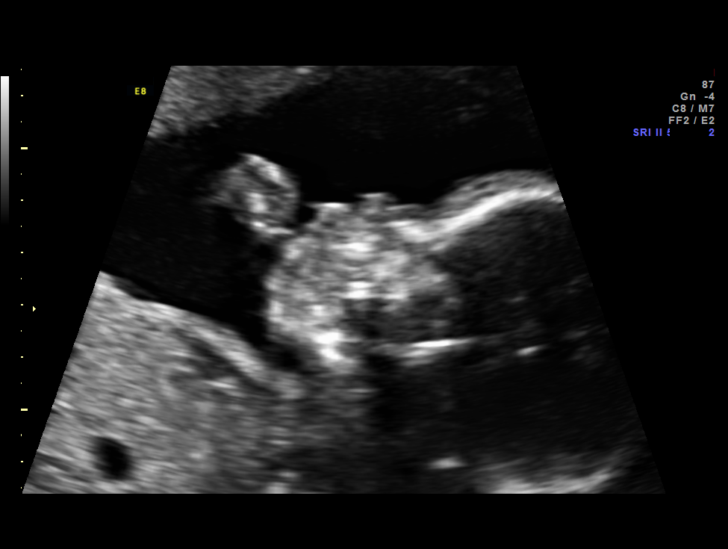

[12 of 28 positions shown; findings below may reference images not displayed]

OBSTETRICS REPORT
                      (Signed Final 05/28/2012 [DATE])

Service(s) Provided

 US OB FOLLOW UP                                       76816.1
Indications

 Advanced maternal age (AMA), Multigravida
 Diabetes - Pregestational, Class B
Fetal Evaluation

 Num Of Fetuses:    1
 Fetal Heart Rate:  145                          bpm
 Cardiac Activity:  Observed
 Presentation:      Cephalic
 Placenta:          Anterior, above cervical os
 P. Cord            Previously Visualized
 Insertion:

 Amniotic Fluid
 AFI FV:      Subjectively within normal limits
                                             Larg Pckt:     5.2  cm
Biometry

 BPD:     59.2  mm     G. Age:  24w 1d                CI:         79.3   70 - 86
 OFD:     74.7  mm                                    FL/HC:      19.8   18.7 -

 HC:     213.6  mm     G. Age:  23w 3d       11  %    HC/AC:      1.16   1.05 -

 AC:     184.4  mm     G. Age:  23w 2d       18  %    FL/BPD:     71.5   71 - 87
 FL:      42.3  mm     G. Age:  23w 6d       27  %    FL/AC:      22.9   20 - 24
 HUM:     37.8  mm     G. Age:  23w 2d       22  %

 Est. FW:     602  gm      1 lb 5 oz     38  %
Gestational Age

 LMP:           24w 1d        Date:  12/11/11                 EDD:   09/16/12
 U/S Today:     23w 5d                                        EDD:   09/19/12
 Best:          24w 1d     Det. By:  LMP  (12/11/11)          EDD:   09/16/12
Anatomy

 Cranium:          Previously seen        Aortic Arch:      Previously seen
 Fetal Cavum:      Previously seen        Ductal Arch:      Previously seen
 Ventricles:       Appears normal         Diaphragm:        Previously seen
 Choroid Plexus:   Previously seen        Stomach:          Appears normal, left
                                                            sided
 Cerebellum:       Previously seen        Abdomen:          Appears normal
 Posterior Fossa:  Previously seen        Abdominal Wall:   Appears nml (cord
                                                            insert, abd wall)
 Nuchal Fold:      Previously seen        Cord Vessels:     Previously seen
 Face:             Orbits and profile     Kidneys:          Appear normal
                   previously seen
 Lips:             Previously seen        Bladder:          Appears normal
 Heart:            Appears normal         Spine:            Previously seen
                   (4CH, axis, and
                   situs)
 RVOT:             Previously seen        Lower             Previously seen
                                          Extremities:
 LVOT:             Previously seen        Upper             Previously seen
                                          Extremities:

 Other:  Male gender. Heels and 5th digit previously seen.
Cervix Uterus Adnexa

 Cervical Length:    3.4      cm

 Cervix:       Normal appearance by transabdominal scan. Appears
               closed, without funnelling.
Impression

 Single IUP at 24 [DATE] weeks
 Normal interval anatomy
 Fetal growth is appropriate (38th %tile)
 Normal amniotic fluid volume
Recommendations

 Recommend follow-up ultrasound examination in 4 weeks for
 interval growth.

 questions or concerns.

## 2014-03-27 ENCOUNTER — Encounter: Payer: Self-pay | Admitting: Women's Health

## 2014-04-12 IMAGING — US US FETAL BPP W/O NONSTRESS
1 series · 5 of 5 positions shown · non-contrast
Comparison: none

[Series 1: us fetal bpp w/o nonstress · 0.23mm/px · 5 of 5 slices shown]
[im 1/5]
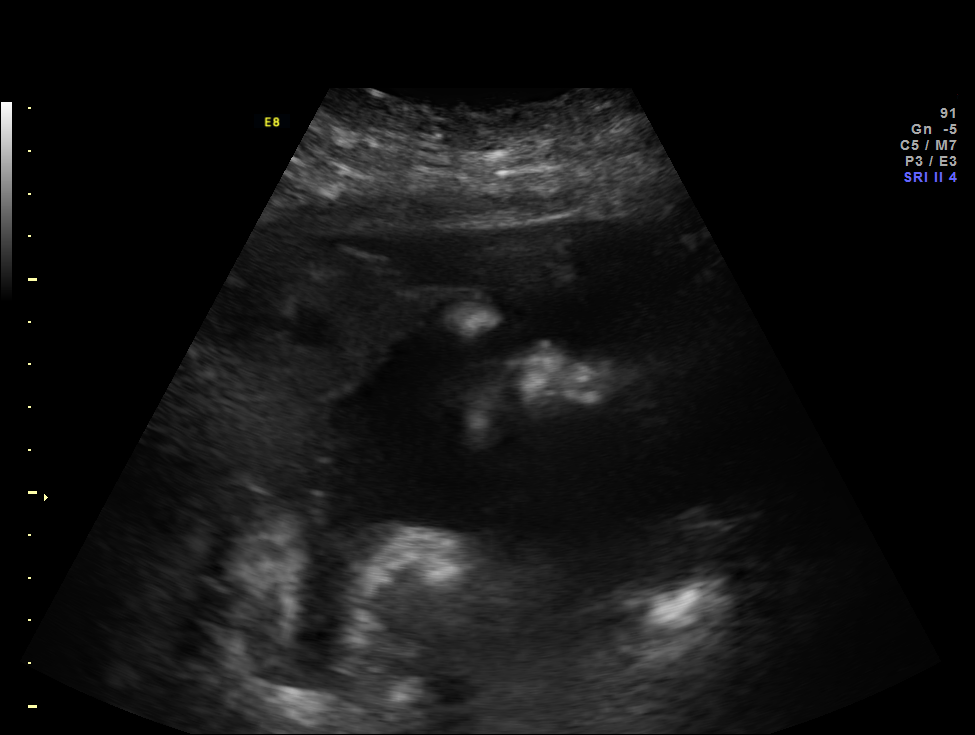
[im 2/5]
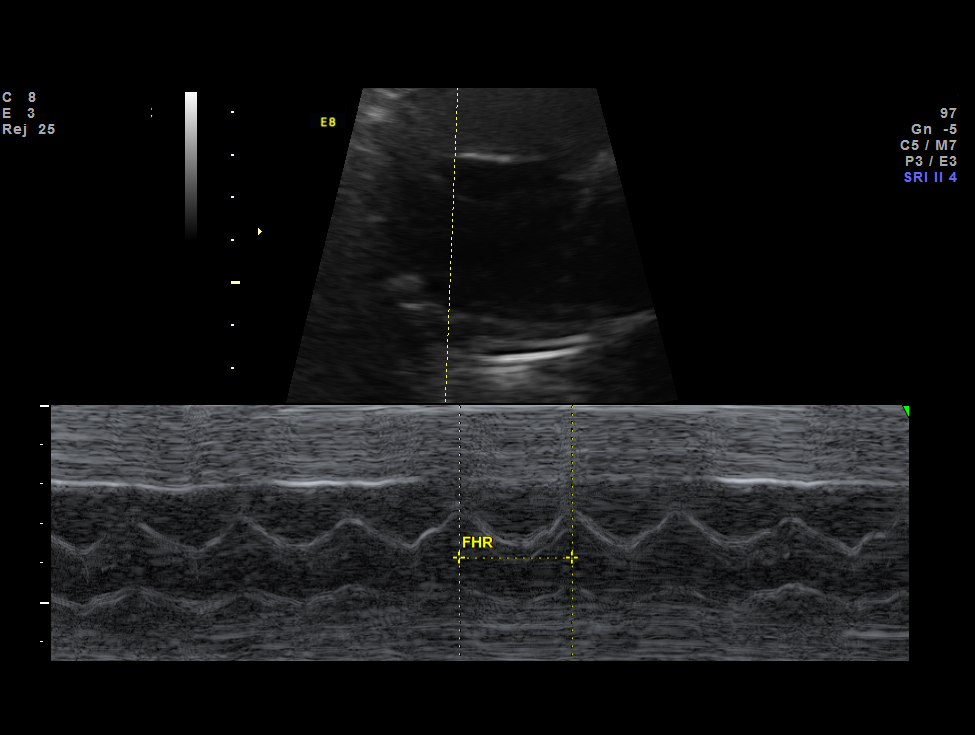
[im 3/5]
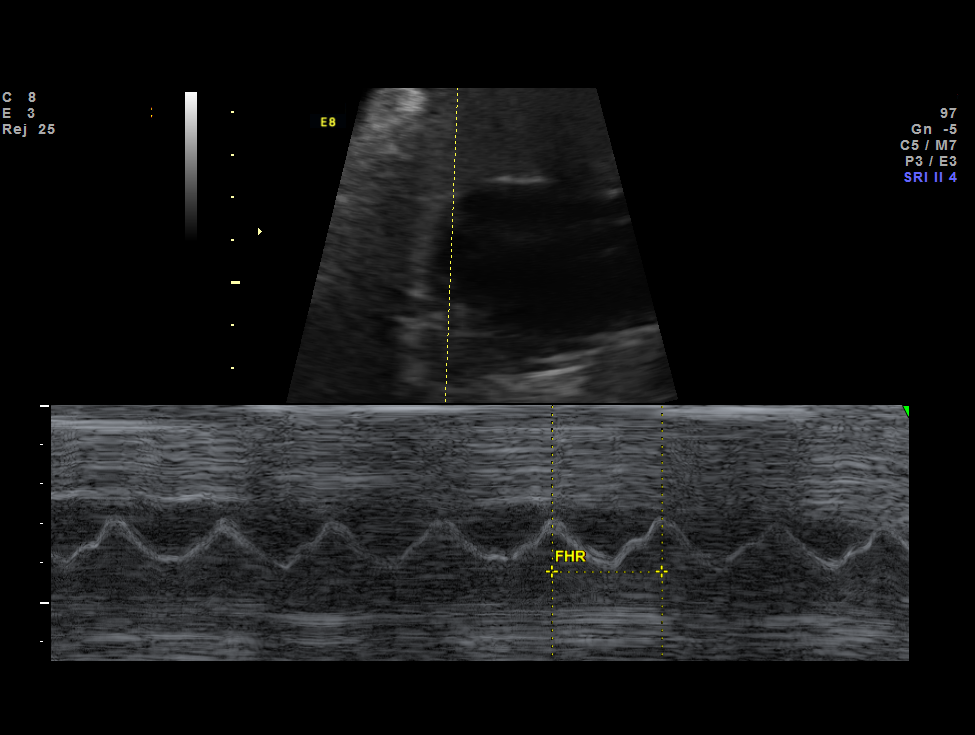
[im 4/5]
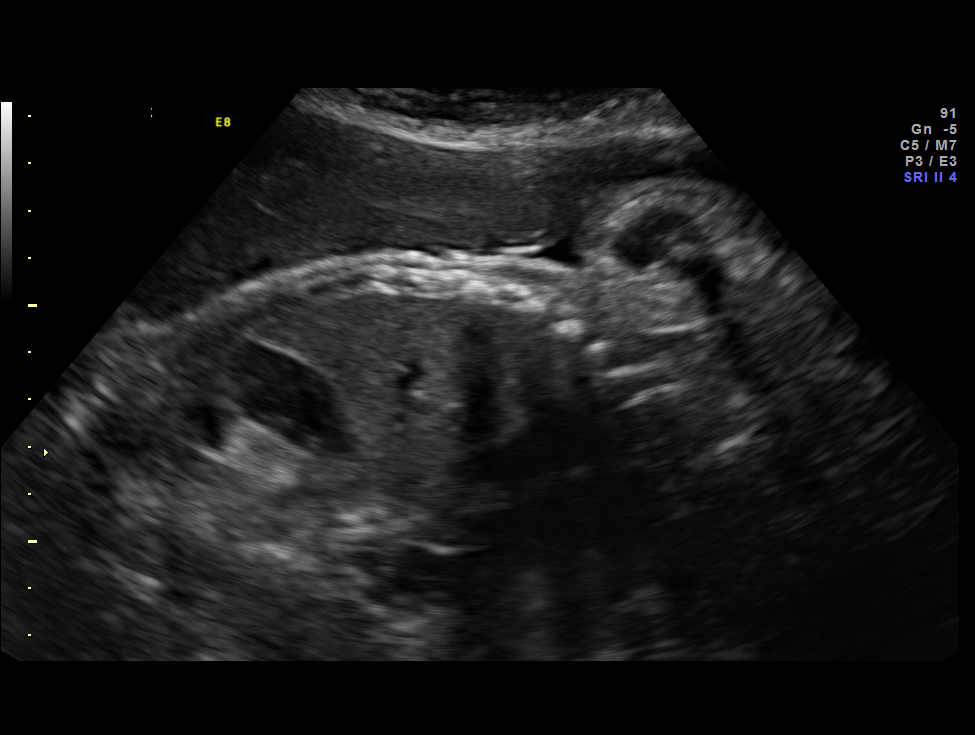
[im 5/5]
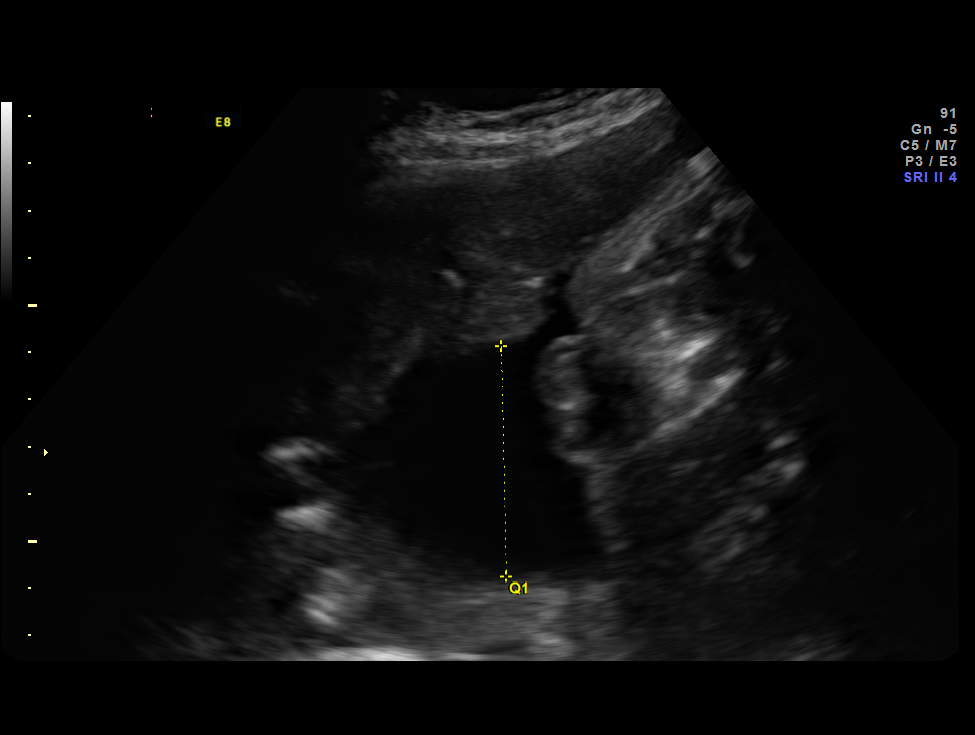

[5 of 5 positions shown; findings below may reference images not displayed]

OBSTETRICS REPORT
                      (Signed Final 08/23/2012 [DATE])

Service(s) Provided

Indications

 Advanced maternal age (AMA), Multigravida (36);
 low risk [place]
 Diabetes - Pregestational, Class B on insulin
 S/P gastric bypass; H/O hypertension prior to
 surgery
 Hypertension - Gestational vs Chronic; recently
 started on Labetalol
 Lower baseline on NST
Fetal Evaluation

 Num Of Fetuses:    1
 Fetal Heart Rate:  123                          bpm
 Cardiac Activity:  Observed
 Presentation:      Cephalic
 Placenta:          Anterior, above cervical os

 Amniotic Fluid
 AFI FV:      Subjectively within normal limits
                                             Larg Pckt:    4.89  cm
Biophysical Evaluation

 Amniotic F.V:   Pocket => 2 cm two         F. Tone:        Observed
                 planes
 F. Movement:    Observed                   Score:          [DATE]
 F. Breathing:   Observed
Gestational Age

 LMP:           36w 4d        Date:  12/11/11                 EDD:   09/16/12
 Best:          36w 4d     Det. By:  LMP  (12/11/11)          EDD:   09/16/12
Impression

 IUP at 36+4 weeks
 Normal amniotic fluid volume
 BPP [DATE]
Recommendations

 Continue twice weekly NSTs with weekly AFIs

 questions or concerns.

## 2014-08-02 ENCOUNTER — Ambulatory Visit (INDEPENDENT_AMBULATORY_CARE_PROVIDER_SITE_OTHER): Payer: 59 | Admitting: Women's Health

## 2014-08-02 ENCOUNTER — Encounter: Payer: Self-pay | Admitting: Women's Health

## 2014-08-02 VITALS — BP 150/80 | Ht 62.0 in | Wt 197.0 lb

## 2014-08-02 DIAGNOSIS — N912 Amenorrhea, unspecified: Secondary | ICD-10-CM | POA: Diagnosis not present

## 2014-08-02 LAB — PREGNANCY, URINE: Preg Test, Ur: POSITIVE

## 2014-08-02 NOTE — Progress Notes (Signed)
Patient ID: Melissa Braun, female   DOB: 01/05/1976, 39 y.o.   MRN: [id number] Presents with positive home UPT. Mirena IUD placed 09/2012 and had light regular monthly cycles since, last cycle 06/28/2014 light. IUD string visible at last annual exam 09/2013. Type 1 diabetes, history of infertility, history of gastric bypass. Has a biological son age 39 and 2  adopted children ages 684 and 605. Denies vaginal bleeding, discharge, cramping, pain. Taking vitamin daily.  Exam: Appears well but surprised. UPT positive. Sternal genitalia within normal limits, speculum exam IUD string not visible, bimanual no CMT or fullness, uterus small.  Early pregnancy, questionable Mirena IUD  Plan: Schedule ultrasound for dating and if IUD present. Aware we no longer deliver, had prenatal care in high risk clinic and aware will need to return to high-risk clinic. Safe pregnancy behaviors reviewed and is aware.

## 2014-08-03 ENCOUNTER — Telehealth: Payer: Self-pay | Admitting: *Deleted

## 2014-08-03 NOTE — Telephone Encounter (Signed)
Pt came in office 08/02/14 with positive UPT, asked if okay to take benadryl PRN for throughout pregnancy. Please advise

## 2014-08-03 NOTE — Telephone Encounter (Signed)
Ok to take but not daily, less is always best, but take PNV daily.

## 2014-08-04 NOTE — Telephone Encounter (Signed)
Left the below on pt voicemail. 

## 2014-08-15 ENCOUNTER — Ambulatory Visit (INDEPENDENT_AMBULATORY_CARE_PROVIDER_SITE_OTHER): Payer: 59

## 2014-08-15 ENCOUNTER — Other Ambulatory Visit: Payer: Self-pay | Admitting: Women's Health

## 2014-08-15 ENCOUNTER — Ambulatory Visit (INDEPENDENT_AMBULATORY_CARE_PROVIDER_SITE_OTHER): Payer: 59 | Admitting: Women's Health

## 2014-08-15 ENCOUNTER — Encounter: Payer: Self-pay | Admitting: Women's Health

## 2014-08-15 DIAGNOSIS — O9989 Other specified diseases and conditions complicating pregnancy, childbirth and the puerperium: Secondary | ICD-10-CM

## 2014-08-15 DIAGNOSIS — T8331XA Breakdown (mechanical) of intrauterine contraceptive device, initial encounter: Secondary | ICD-10-CM

## 2014-08-15 DIAGNOSIS — Z331 Pregnant state, incidental: Secondary | ICD-10-CM

## 2014-08-15 DIAGNOSIS — Z3491 Encounter for supervision of normal pregnancy, unspecified, first trimester: Secondary | ICD-10-CM

## 2014-08-15 DIAGNOSIS — N912 Amenorrhea, unspecified: Secondary | ICD-10-CM

## 2014-08-15 DIAGNOSIS — O3680X0 Pregnancy with inconclusive fetal viability, not applicable or unspecified: Secondary | ICD-10-CM

## 2014-08-15 NOTE — Progress Notes (Signed)
Patient ID: Melissa ChimeShannon F Keys, female   DOB: 07/05/1975, 39 y.o.   MRN: [id number] Presents for viability/dating ultrasound, conceived with Mirena IUD, history of infertility. 2014  high risk pregnancy, severe preeclampsia, son is healthy and 2, has 2 adopted children ages 104 and 615. Diabetes on insulin, history of gastric bypass surgery. Denies bleeding, abdominal pain, discharge or urinary symptoms.  Exam: Appears well. Ultrasound: Transvaginal and transabdominal images. Anteverted uterus with living IUP seen in fundus. Fetal pole 6 weeks 3 days fetal heart 109 bpm. Cervix long and closed. No evidence of an IUD. Right ovary corpus luteum cyst 35 x 20 mm. Left ovary normal. Negative cul-de-sac.  Early gestation  Plan: Copy of US given. Instructed to schedule new OB appointment with high risk clinic. Call if difficulty scheduling. Continue prenatal vitamins. Healthy pregnancy behaviors reviewed. Congratulations given.

## 2014-08-15 NOTE — Patient Instructions (Signed)
First Trimester of Pregnancy The first trimester of pregnancy is from week 1 until the end of week 12 (months 1 through 3). A week after a sperm fertilizes an egg, the egg will implant on the wall of the uterus. This embryo will begin to develop into a baby. Genes from you and your partner are forming the baby. The female genes determine whether the baby is a boy or a girl. At 6-8 weeks, the eyes and face are formed, and the heartbeat can be seen on ultrasound. At the end of 12 weeks, all the baby's organs are formed.  Now that you are pregnant, you will want to do everything you can to have a healthy baby. Two of the most important things are to get good prenatal care and to follow your health care provider's instructions. Prenatal care is all the medical care you receive before the baby's birth. This care will help prevent, find, and treat any problems during the pregnancy and childbirth. BODY CHANGES Your body goes through many changes during pregnancy. The changes vary from woman to woman.   You may gain or lose a couple of pounds at first.  You may feel sick to your stomach (nauseous) and throw up (vomit). If the vomiting is uncontrollable, call your health care provider.  You may tire easily.  You may develop headaches that can be relieved by medicines approved by your health care provider.  You may urinate more often. Painful urination may mean you have a bladder infection.  You may develop heartburn as a result of your pregnancy.  You may develop constipation because certain hormones are causing the muscles that push waste through your intestines to slow down.  You may develop hemorrhoids or swollen, bulging veins (varicose veins).  Your breasts may begin to grow larger and become tender. Your nipples may stick out more, and the tissue that surrounds them (areola) may become darker.  Your gums may bleed and may be sensitive to brushing and flossing.  Dark spots or blotches (chloasma,  mask of pregnancy) may develop on your face. This will likely fade after the baby is born.  Your menstrual periods will stop.  You may have a loss of appetite.  You may develop cravings for certain kinds of food.  You may have changes in your emotions from day to day, such as being excited to be pregnant or being concerned that something may go wrong with the pregnancy and baby.  You may have more vivid and strange dreams.  You may have changes in your hair. These can include thickening of your hair, rapid growth, and changes in texture. Some women also have hair loss during or after pregnancy, or hair that feels dry or thin. Your hair will most likely return to normal after your baby is born. WHAT TO EXPECT AT YOUR PRENATAL VISITS During a routine prenatal visit:  You will be weighed to make sure you and the baby are growing normally.  Your blood pressure will be taken.  Your abdomen will be measured to track your baby's growth.  The fetal heartbeat will be listened to starting around week 10 or 12 of your pregnancy.  Test results from any previous visits will be discussed. Your health care provider may ask you:  How you are feeling.  If you are feeling the baby move.  If you have had any abnormal symptoms, such as leaking fluid, bleeding, severe headaches, or abdominal cramping.  If you have any questions. Other tests   that may be performed during your first trimester include:  Blood tests to find your blood type and to check for the presence of any previous infections. They will also be used to check for low iron levels (anemia) and Rh antibodies. Later in the pregnancy, blood tests for diabetes will be done along with other tests if problems develop.  Urine tests to check for infections, diabetes, or protein in the urine.  An ultrasound to confirm the proper growth and development of the baby.  An amniocentesis to check for possible genetic problems.  Fetal screens for  spina bifida and Down syndrome.  You may need other tests to make sure you and the baby are doing well. HOME CARE INSTRUCTIONS  Medicines  Follow your health care provider's instructions regarding medicine use. Specific medicines may be either safe or unsafe to take during pregnancy.  Take your prenatal vitamins as directed.  If you develop constipation, try taking a stool softener if your health care provider approves. Diet  Eat regular, well-balanced meals. Choose a variety of foods, such as meat or vegetable-based protein, fish, milk and low-fat dairy products, vegetables, fruits, and whole grain breads and cereals. Your health care provider will help you determine the amount of weight gain that is right for you.  Avoid raw meat and uncooked cheese. These carry germs that can cause birth defects in the baby.  Eating four or five small meals rather than three large meals a day may help relieve nausea and vomiting. If you start to feel nauseous, eating a few soda crackers can be helpful. Drinking liquids between meals instead of during meals also seems to help nausea and vomiting.  If you develop constipation, eat more high-fiber foods, such as fresh vegetables or fruit and whole grains. Drink enough fluids to keep your urine clear or pale yellow. Activity and Exercise  Exercise only as directed by your health care provider. Exercising will help you:  Control your weight.  Stay in shape.  Be prepared for labor and delivery.  Experiencing pain or cramping in the lower abdomen or low back is a good sign that you should stop exercising. Check with your health care provider before continuing normal exercises.  Try to avoid standing for long periods of time. Move your legs often if you must stand in one place for a long time.  Avoid heavy lifting.  Wear low-heeled shoes, and practice good posture.  You may continue to have sex unless your health care provider directs you  otherwise. Relief of Pain or Discomfort  Wear a good support bra for breast tenderness.   Take warm sitz baths to soothe any pain or discomfort caused by hemorrhoids. Use hemorrhoid cream if your health care provider approves.   Rest with your legs elevated if you have leg cramps or low back pain.  If you develop varicose veins in your legs, wear support hose. Elevate your feet for 15 minutes, 3-4 times a day. Limit salt in your diet. Prenatal Care  Schedule your prenatal visits by the twelfth week of pregnancy. They are usually scheduled monthly at first, then more often in the last 2 months before delivery.  Write down your questions. Take them to your prenatal visits.  Keep all your prenatal visits as directed by your health care provider. Safety  Wear your seat belt at all times when driving.  Make a list of emergency phone numbers, including numbers for family, friends, the hospital, and police and fire departments. General Tips    Ask your health care provider for a referral to a local prenatal education class. Begin classes no later than at the beginning of month 6 of your pregnancy.  Ask for help if you have counseling or nutritional needs during pregnancy. Your health care provider can offer advice or refer you to specialists for help with various needs.  Do not use hot tubs, steam rooms, or saunas.  Do not douche or use tampons or scented sanitary pads.  Do not cross your legs for long periods of time.  Avoid cat litter boxes and soil used by cats. These carry germs that can cause birth defects in the baby and possibly loss of the fetus by miscarriage or stillbirth.  Avoid all smoking, herbs, alcohol, and medicines not prescribed by your health care provider. Chemicals in these affect the formation and growth of the baby.  Schedule a dentist appointment. At home, brush your teeth with a soft toothbrush and be gentle when you floss. SEEK MEDICAL CARE IF:   You have  dizziness.  You have mild pelvic cramps, pelvic pressure, or nagging pain in the abdominal area.  You have persistent nausea, vomiting, or diarrhea.  You have a bad smelling vaginal discharge.  You have pain with urination.  You notice increased swelling in your face, hands, legs, or ankles. SEEK IMMEDIATE MEDICAL CARE IF:   You have a fever.  You are leaking fluid from your vagina.  You have spotting or bleeding from your vagina.  You have severe abdominal cramping or pain.  You have rapid weight gain or loss.  You vomit blood or material that looks like coffee grounds.  You are exposed to German measles and have never had them.  You are exposed to fifth disease or chickenpox.  You develop a severe headache.  You have shortness of breath.  You have any kind of trauma, such as from a fall or a car accident. Document Released: 05/06/2001 Document Revised: 09/26/2013 Document Reviewed: 03/22/2013 Exit[employer] Patient Information 2015 [employer]. This information is not intended to replace advice given to you by your health care provider. Make sure you discuss any questions you have with your health care provider.  

## 2014-08-21 ENCOUNTER — Encounter: Payer: Self-pay | Admitting: Obstetrics and Gynecology

## 2014-08-21 ENCOUNTER — Other Ambulatory Visit: Payer: Self-pay | Admitting: Obstetrics and Gynecology

## 2014-08-21 ENCOUNTER — Ambulatory Visit (INDEPENDENT_AMBULATORY_CARE_PROVIDER_SITE_OTHER): Payer: Self-pay | Admitting: Obstetrics and Gynecology

## 2014-08-21 ENCOUNTER — Encounter: Payer: Self-pay | Attending: Obstetrics and Gynecology | Admitting: *Deleted

## 2014-08-21 ENCOUNTER — Ambulatory Visit: Payer: Self-pay

## 2014-08-21 VITALS — BP 124/57 | HR 86 | Temp 98.1°F | Wt 200.7 lb

## 2014-08-21 DIAGNOSIS — O09521 Supervision of elderly multigravida, first trimester: Secondary | ICD-10-CM

## 2014-08-21 DIAGNOSIS — Z713 Dietary counseling and surveillance: Secondary | ICD-10-CM | POA: Insufficient documentation

## 2014-08-21 DIAGNOSIS — D649 Anemia, unspecified: Secondary | ICD-10-CM | POA: Insufficient documentation

## 2014-08-21 DIAGNOSIS — Z3682 Encounter for antenatal screening for nuchal translucency: Secondary | ICD-10-CM

## 2014-08-21 DIAGNOSIS — O24911 Unspecified diabetes mellitus in pregnancy, first trimester: Secondary | ICD-10-CM | POA: Insufficient documentation

## 2014-08-21 DIAGNOSIS — O09511 Supervision of elderly primigravida, first trimester: Secondary | ICD-10-CM

## 2014-08-21 DIAGNOSIS — Z98891 History of uterine scar from previous surgery: Secondary | ICD-10-CM

## 2014-08-21 DIAGNOSIS — D509 Iron deficiency anemia, unspecified: Secondary | ICD-10-CM

## 2014-08-21 DIAGNOSIS — Z9889 Other specified postprocedural states: Secondary | ICD-10-CM

## 2014-08-21 DIAGNOSIS — O0991 Supervision of high risk pregnancy, unspecified, first trimester: Secondary | ICD-10-CM

## 2014-08-21 DIAGNOSIS — O09513 Supervision of elderly primigravida, third trimester: Secondary | ICD-10-CM

## 2014-08-21 LAB — POCT URINALYSIS DIP (DEVICE)
BILIRUBIN URINE: NEGATIVE
Glucose, UA: NEGATIVE mg/dL
HGB URINE DIPSTICK: NEGATIVE
KETONES UR: NEGATIVE mg/dL
Nitrite: POSITIVE — AB
PH: 6 (ref 5.0–8.0)
Protein, ur: NEGATIVE mg/dL
SPECIFIC GRAVITY, URINE: 1.025 (ref 1.005–1.030)
Urobilinogen, UA: 0.2 mg/dL (ref 0.0–1.0)

## 2014-08-21 LAB — COMPLETE METABOLIC PANEL WITH GFR
ALBUMIN: 3.6 g/dL (ref 3.5–5.2)
ALK PHOS: 75 U/L (ref 39–117)
ALT: 10 U/L (ref 0–35)
AST: 12 U/L (ref 0–37)
BUN: 9 mg/dL (ref 6–23)
CALCIUM: 8.3 mg/dL — AB (ref 8.4–10.5)
CHLORIDE: 107 meq/L (ref 96–112)
CO2: 19 mEq/L (ref 19–32)
CREATININE: 0.5 mg/dL (ref 0.50–1.10)
GFR, Est African American: >89 mL/min
GFR, Est Non African American: >89 mL/min
Glucose, Bld: 106 mg/dL — ABNORMAL HIGH (ref 70–99)
POTASSIUM: 3.9 meq/L (ref 3.5–5.3)
Sodium: 138 mEq/L (ref 135–145)
Total Bilirubin: 0.3 mg/dL (ref 0.2–1.2)
Total Protein: 5.7 g/dL — ABNORMAL LOW (ref 6.0–8.3)

## 2014-08-21 LAB — HEMOGLOBIN A1C
Hgb A1c MFr Bld: 8 % — ABNORMAL HIGH (ref <5.7)
MEAN PLASMA GLUCOSE: 183 mg/dL — AB (ref <117)

## 2014-08-21 LAB — TSH: TSH: 0.466 u[IU]/mL (ref 0.350–4.500)

## 2014-08-21 MED ORDER — FERROUS SULFATE 325 (65 FE) MG PO TABS: 325.0000 mg | ORAL_TABLET | Freq: Two times a day (BID) | ORAL | Status: DC

## 2014-08-21 NOTE — Progress Notes (Signed)
39 y.o. is a G2P1001 at 2098w5d who presents for initial prenatal visit. Doing well today. No concerns or complaints.   PMHx:  - Type II Diabetes, insulin dependent  PSHx:  - C section secondary to  - Gastric Bypass Surgery  OB Hx:  - G1: term, pLTCS for NRFHT and failure to progress, severe preeclampsia - G2: current  Meds: - Insulin 70/30  Allergies: - Bactrim, Levofloxacin, Doxycycline  Social Hx:  - Denies alcohol, tobacco, illicit drug use.   Objective: LMP 06/28/2014 Gen: no acute distress HEENT: MMM, no thyromegaly CV: normal rate Lungs: normal effort Abd: gravid, nontender GU: normal external genitalia Ext: no edema  A/P 1. Type II Diabetes. Currently on 70/30 BID. Patient to see diabetes educator and nutrition today. Will likely need to switch insulin to NPH and Regular. Check a1c, TSH today.  2. History of Preeclampsia with prior pregnancy. Blood pressure wnl. Baseline HELLP labs today.  3. AMA. Recommend FIRST screen. Ordered.  4. History of C-section. Desires TOLAC. Discussed risks/benefits. Will plan to sign consent at future visit.  5. Routine prenatal visit. Labwork today. Pap smear up to date. Vaginal cultures.

## 2014-08-21 NOTE — Progress Notes (Signed)
Nutrition note: DM diet education Pt has h/o obesity & has type 2 DM. Pt has gained 2.7# @ 4412w5d

## 2014-08-21 NOTE — Progress Notes (Signed)
Nutrition note: DM diet education  Pt has h/o obesity & has type 2 DM but has not received much diet education.  Pt has gained 2.7# @ 7913w5d, which is wnl. Pt reports eating 3 meals & 2-3 snacks/d. Pt is taking a PNV. Pt reports no N&V but has some heartburn. Pt reports no walking or physical activity.  Pt received verbal & written education on DM diet during pregnancy. Encouraged at least 30 mins of walking/d. Discussed wt gain goals of 11-20# or 0.5#/wk. Pt agrees to follow DM diet with 3 meals & 1-3 snacks/d with proper CHO/ protein combination. Pt does not have WIC & does not plan to apply at this time. Pt plans to BF. F/u in 4-6 wks Blondell RevealLaura Shaw Dobek, MS, RD, LDN, [facility]CLC

## 2014-08-21 NOTE — Progress Notes (Signed)
First Trimester screen 09/22/14 @ 315p with [facility].

## 2014-08-21 NOTE — Progress Notes (Signed)
Initial labs and cultures today; pap done in 2015 normal.  New OB packet given.  Declined flu vaccine-- reports having it in October at work.

## 2014-08-21 NOTE — Progress Notes (Signed)
Patient DX with T2DM at the age of 39yo. She is a patient of Dr. Assunta FoundB. Balan. Her present A1c (06/2014) is 7.9% which patient states is good for her. She has been in the 15% range. The battery in her existing glucometer has run out. She will call me with information about her glucometer and I will order meter/supplies as needed. She is presently testing her glucose FBS (72-85mg /dl), before lunch ([id number]) and 2hpp dinner (130-150). She will now begin tsting FBS, and 2hpp all meals. Her insulin is Novolog 70/30 24u AM and 12units PM per Dr. Talmage NapBalan.

## 2014-08-21 NOTE — Progress Notes (Signed)
Glucose:  Negative Protein:    Negative 

## 2014-08-22 LAB — PRENATAL PROFILE (SOLSTAS)
ANTIBODY SCREEN: NEGATIVE
BASOS PCT: 0 % (ref 0–1)
Basophils Absolute: 0 10*3/uL (ref 0.0–0.1)
EOS ABS: 0.2 10*3/uL (ref 0.0–0.7)
Eosinophils Relative: 2 % (ref 0–5)
HEMATOCRIT: 26.6 % — AB (ref 36.0–46.0)
HEMOGLOBIN: 8.1 g/dL — AB (ref 12.0–15.0)
HEP B S AG: NEGATIVE
HIV: NONREACTIVE
LYMPHS ABS: 1.7 10*3/uL (ref 0.7–4.0)
Lymphocytes Relative: 20 % (ref 12–46)
MCH: 20.1 pg — ABNORMAL LOW (ref 26.0–34.0)
MCHC: 30.5 g/dL (ref 30.0–36.0)
MCV: 66 fL — ABNORMAL LOW (ref 78.0–100.0)
Monocytes Absolute: 0.5 10*3/uL (ref 0.1–1.0)
Monocytes Relative: 6 % (ref 3–12)
NEUTROS ABS: 6 10*3/uL (ref 1.7–7.7)
NEUTROS PCT: 72 % (ref 43–77)
Platelets: 181 10*3/uL (ref 150–400)
RBC: 4.03 MIL/uL (ref 3.87–5.11)
RDW: 22.3 % — AB (ref 11.5–15.5)
RH TYPE: POSITIVE
RUBELLA: 2.05 {index} — AB (ref <0.90)
WBC: 8.3 10*3/uL (ref 4.0–10.5)

## 2014-08-22 LAB — PROTEIN / CREATININE RATIO, URINE
Creatinine, Urine: 91.1 mg/dL
PROTEIN CREATININE RATIO: 0.14 (ref <0.15)
Total Protein, Urine: 13 mg/dL (ref 5–24)

## 2014-08-22 LAB — GC/CHLAMYDIA PROBE AMP
CT Probe RNA: NEGATIVE
GC PROBE AMP APTIMA: NEGATIVE

## 2014-08-23 LAB — PRESCRIPTION MONITORING PROFILE (19 PANEL)
AMPHETAMINE/METH: NEGATIVE ng/mL
BENZODIAZEPINE SCREEN, URINE: NEGATIVE ng/mL
BUPRENORPHINE, URINE: NEGATIVE ng/mL
Barbiturate Screen, Urine: NEGATIVE ng/mL
CARISOPRODOL, URINE: NEGATIVE ng/mL
Cannabinoid Scrn, Ur: NEGATIVE ng/mL
Cocaine Metabolites: NEGATIVE ng/mL
Creatinine, Urine: 91.56 mg/dL (ref >20.0)
ECSTASY: NEGATIVE ng/mL
FENTANYL URINE: NEGATIVE ng/mL
MEPERIDINE UR: NEGATIVE ng/mL
METHAQUALONE SCREEN (URINE): NEGATIVE ng/mL
Methadone Screen, Urine: NEGATIVE ng/mL
Nitrites, Initial: NEGATIVE ug/mL
Opiate Screen, Urine: NEGATIVE ng/mL
Oxycodone Screen, Ur: NEGATIVE ng/mL
PHENCYCLIDINE, UR: NEGATIVE ng/mL
Propoxyphene: NEGATIVE ng/mL
TAPENTADOLUR: NEGATIVE ng/mL
TRAMADOL UR: NEGATIVE ng/mL
Zolpidem, Urine: NEGATIVE ng/mL
pH, Initial: 6.3 pH (ref 4.5–8.9)

## 2014-08-23 LAB — HEMOGLOBINOPATHY EVALUATION
HEMOGLOBIN OTHER: 0 %
HGB A2 QUANT: 2.9 % (ref 2.2–3.2)
HGB A: 62.8 % — AB (ref 96.8–97.8)
HGB F QUANT: 0 % (ref 0.0–2.0)
HGB S QUANTITAION: 34.3 % — AB

## 2014-08-24 LAB — CULTURE, OB URINE: Colony Count: >=100000

## 2014-08-28 ENCOUNTER — Other Ambulatory Visit: Payer: Self-pay | Admitting: Obstetrics and Gynecology

## 2014-08-28 ENCOUNTER — Telehealth: Payer: Self-pay

## 2014-08-28 ENCOUNTER — Encounter: Payer: Self-pay | Admitting: Obstetrics and Gynecology

## 2014-08-28 DIAGNOSIS — D573 Sickle-cell trait: Secondary | ICD-10-CM | POA: Insufficient documentation

## 2014-08-28 DIAGNOSIS — R8271 Bacteriuria: Secondary | ICD-10-CM | POA: Insufficient documentation

## 2014-08-28 MED ORDER — CEPHALEXIN 250 MG PO CAPS: 250.0000 mg | ORAL_CAPSULE | Freq: Four times a day (QID) | ORAL | Status: AC

## 2014-08-28 NOTE — Telephone Encounter (Signed)
Patient returned call. Informed her of results. Patient reports appointment canceled on 09/04/14 due to another appointment-- [facility] Appointment for 09/11/14 kept the same.

## 2014-08-28 NOTE — Telephone Encounter (Signed)
-----   Message from William DaltonMorgan McEachern, MD sent at 08/28/2014  8:38 AM EDT ----- Regarding: UTI This patient has asymptomatic bacteruria on her initial labs. I've called in an antibiotic to her pharmacy - Keflex 250 mg 4x daily x7 days. Will you call the patient to let her know?   Also she's not scheduled for follow up until 4/18. She has uncontrolled diabetes, is there any way we can get her an appointment a little sooner?  Thanks,  Lequita HaltMorgan

## 2014-08-28 NOTE — Telephone Encounter (Signed)
Attempted to contact patient. No answer. Left message stating we are calling with results, please call clinic. Per chart review patient had appointment scheduled for 09/04/14 but canceled it.

## 2014-09-01 ENCOUNTER — Encounter: Payer: Self-pay | Admitting: Family Medicine

## 2014-09-04 ENCOUNTER — Encounter: Payer: Self-pay | Admitting: Obstetrics & Gynecology

## 2014-09-04 NOTE — Addendum Note (Signed)
Addended by: Darrel HooverASSETTE, KELLY P on: 09/04/2014 09:16 AM   Modules accepted: Orders, Medications

## 2014-09-04 NOTE — Addendum Note (Signed)
Addended by: Darrel HooverASSETTE, KELLY P on: 09/04/2014 08:57 AM   Modules accepted: Orders, Medications

## 2014-09-11 ENCOUNTER — Other Ambulatory Visit: Payer: Self-pay | Admitting: Obstetrics & Gynecology

## 2014-09-11 ENCOUNTER — Ambulatory Visit (INDEPENDENT_AMBULATORY_CARE_PROVIDER_SITE_OTHER): Payer: 59 | Admitting: Obstetrics & Gynecology

## 2014-09-11 VITALS — BP 128/53 | HR 89 | Temp 98.2°F | Wt 198.9 lb

## 2014-09-11 DIAGNOSIS — O0991 Supervision of high risk pregnancy, unspecified, first trimester: Secondary | ICD-10-CM | POA: Diagnosis not present

## 2014-09-11 DIAGNOSIS — I10 Essential (primary) hypertension: Secondary | ICD-10-CM

## 2014-09-11 DIAGNOSIS — Z9884 Bariatric surgery status: Secondary | ICD-10-CM

## 2014-09-11 DIAGNOSIS — O3680X1 Pregnancy with inconclusive fetal viability, fetus 1: Secondary | ICD-10-CM

## 2014-09-11 DIAGNOSIS — D573 Sickle-cell trait: Secondary | ICD-10-CM

## 2014-09-11 LAB — POCT URINALYSIS DIP (DEVICE)
Bilirubin Urine: NEGATIVE
Glucose, UA: NEGATIVE mg/dL
Hgb urine dipstick: NEGATIVE
KETONES UR: NEGATIVE mg/dL
LEUKOCYTES UA: NEGATIVE
Nitrite: NEGATIVE
PH: 6 (ref 5.0–8.0)
Protein, ur: NEGATIVE mg/dL
SPECIFIC GRAVITY, URINE: 1.02 (ref 1.005–1.030)
Urobilinogen, UA: 0.2 mg/dL (ref 0.0–1.0)

## 2014-09-11 MED ORDER — ASPIRIN 81 MG PO CHEW: 81.0000 mg | CHEWABLE_TABLET | Freq: Every day | ORAL | Status: DC

## 2014-09-11 NOTE — Progress Notes (Signed)
Left message with [facility] pick up service to come pick up patient's blood. Awaiting call back.   [phone]: Received call from Liborio NixonJanice with [facility] who informed me courier will be by at 1220 to pick up specimens. Recquisitons to be sent with blood.

## 2014-09-11 NOTE — Progress Notes (Signed)
Bedside US done @ 0843 today for viability. Single IUP, FHR = 164 bpm per PW doppler.

## 2014-09-11 NOTE — Progress Notes (Signed)
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  Drawn today.  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  Dinner goes to 130s.  Pt would like to stay with 70/30 if possible.  Pt will need plit dosing if dinner values are not brought under control  Pt will work on diet nad walk after dinner.  HTN not a problem after by pass and weight loss. Pt has first screen ordered and has appt.

## 2014-09-14 LAB — VITAMIN D 25 HYDROXY (VIT D DEFICIENCY, FRACTURES): VIT D 25 HYDROXY: 17.3 ng/mL — AB (ref 30.0–100.0)

## 2014-09-14 LAB — FOLATE RBC: HCT: 32.6 % — ABNORMAL LOW (ref 34.0–46.6)

## 2014-09-14 LAB — VITAMIN B1

## 2014-09-14 LAB — CALCIUM: Calcium: 9 mg/dL (ref 8.7–10.2)

## 2014-09-22 ENCOUNTER — Ambulatory Visit (HOSPITAL_COMMUNITY)
Admission: RE | Admit: 2014-09-22 | Discharge: 2014-09-22 | Disposition: A | Discharge status: Home / Self Care | Payer: 59 | Source: Ambulatory Visit | Attending: Obstetrics and Gynecology | Admitting: Obstetrics and Gynecology

## 2014-09-22 ENCOUNTER — Other Ambulatory Visit (HOSPITAL_COMMUNITY): Payer: Self-pay | Admitting: Maternal and Fetal Medicine

## 2014-09-22 DIAGNOSIS — IMO0002 Reserved for concepts with insufficient information to code with codable children: Secondary | ICD-10-CM

## 2014-09-22 DIAGNOSIS — O09522 Supervision of elderly multigravida, second trimester: Secondary | ICD-10-CM

## 2014-09-22 DIAGNOSIS — Z3A12 12 weeks gestation of pregnancy: Secondary | ICD-10-CM

## 2014-09-22 DIAGNOSIS — O09521 Supervision of elderly multigravida, first trimester: Secondary | ICD-10-CM | POA: Diagnosis not present

## 2014-09-22 DIAGNOSIS — Z3A18 18 weeks gestation of pregnancy: Secondary | ICD-10-CM

## 2014-09-22 DIAGNOSIS — O352XX0 Maternal care for (suspected) hereditary disease in fetus, not applicable or unspecified: Secondary | ICD-10-CM

## 2014-09-22 DIAGNOSIS — Z0489 Encounter for examination and observation for other specified reasons: Secondary | ICD-10-CM

## 2014-09-22 DIAGNOSIS — Z315 Encounter for genetic counseling: Secondary | ICD-10-CM | POA: Diagnosis present

## 2014-09-22 DIAGNOSIS — O34219 Maternal care for unspecified type scar from previous cesarean delivery: Secondary | ICD-10-CM

## 2014-09-22 DIAGNOSIS — O24912 Unspecified diabetes mellitus in pregnancy, second trimester: Secondary | ICD-10-CM

## 2014-09-22 DIAGNOSIS — Z3682 Encounter for antenatal screening for nuchal translucency: Secondary | ICD-10-CM

## 2014-09-22 NOTE — Progress Notes (Signed)
Genetic Counseling  High-Risk Gestation Note  Appointment Date:  09/22/2014 Referred By: William DaltonMcEachern, Morgan, MD Date of Birth:  11/23/1975 Partner:  Grayland Jackimothy Giannini   Pregnancy History: [id number] Estimated Date of Delivery: 04/04/15 Estimated Gestational Age: 3649w2d Attending: Rema FendtJoshua Nitsche, MD   Mrs. Cyndie ChimeShannon F Morejon and her husband, Mr. Grayland Jackimothy Que, were seen for genetic counseling because of a maternal age of 39 y.o.Marland Kitchen.  They were also accompanied by the patient's father.    They were counseled regarding maternal age and the association with risk for chromosome conditions due to nondisjunction with aging of the ova.  We reviewed chromosomes, nondisjunction, and the associated 1 in 2151 risk for fetal aneuploidy related to a maternal age of 39 y.o. at 2049w2d gestation.  They were counseled that the risk for aneuploidy decreases as gestational age increases, accounting for those pregnancies which spontaneously abort.  We specifically discussed Down syndrome (trisomy 1421), trisomies 1713 and 2618, and sex chromosome aneuploidies (47,XXX and 47,XXY) including the common features and prognoses of each.   We reviewed available screening options including First Screen, Quad screen, noninvasive prenatal screening (NIPS)/cell free DNA (cfDNA) testing, and detailed ultrasound.  They were counseled that screening tests are used to modify a patient's a priori risk for aneuploidy, typically based on age. This estimate provides a pregnancy specific risk assessment. We reviewed the benefits and limitations of each option. Specifically, we discussed the conditions for which each test screens, the detection rates, and false positive rates of each. Ms. Beverely PaceBryant specifically asked about NIPS available through [employer], given that she is a [employer] employee. We reviewed the methodology of InformaSeq, the detection rates, and false positive rates of the conditions. We reviewed the limitations, the chance for no results from a sample,  and the relatively rare possibility that this type of screening could reveal underlying maternal health conditions. They were also counseled regarding diagnostic testing via [facility] and amniocentesis. We reviewed the approximate 1 in 100 risk for complications and the approximate 1 in 300-500 risk for complications for amniocentesis, including spontaneous pregnancy loss. After consideration of all the options, she elected to proceed with NIPS (InformaSeq through [facility]).  Those results will be available in 8-10 days.  She declined [facility] and amniocentesis. She stated that she would possibly consider amniocentesis pending results of other screening.   She also expressed interest in pursuing a nuchal translucency ultrasound, which was performed today.  The report will be documented separately.  The patient would like to return for a detailed ultrasound at ~18+ weeks gestation.  This appointment was scheduled for 11/03/14. They understand that screening tests cannot rule out all birth defects or genetic syndromes. The patient was advised of this limitation and states she still does not want additional testing at this time.   Mrs. Beverely PaceBryant was seen for genetic counseling in a previous pregnancy on 03/09/12. Both family histories were reviewed and updated. They were noncontributory for updates regarding birth defects, intellectual disability, recurrent pregnancy loss, and known genetic conditions. Mrs. Beverely PaceBryant has sickle cell trait, and Mr. Beverely PaceBryant reportedly has been screened in the past and does not have sickle cell trait. This was previously discussed with the couple during their genetic counseling session in 2013. Mrs. Beverely PaceBryant stated that they were familiar with the inheritance and newborn screening for sickle cell anemia and sickle cell trait and declined additional discussion at this time. Please see previous genetic counseling note for detailed discussion. Without further information regarding the provided family history,  an accurate genetic  risk cannot be calculated. Further genetic counseling is warranted if more information is obtained.  Mrs. Demmon denied exposure to environmental toxins or chemical agents. She denied the use of alcohol, tobacco or street drugs. She denied significant viral illnesses during the course of her pregnancy. Her medical and surgical histories were contributory for diabetes mellitus, for which she is currently treated with novolog. Women who have insulin dependent diabetes are at an increased risk to have a baby with a birth defect.  The increase in risk correlates with the level of blood sugar control during the pregnancy, particularly during organogenesis.  The increase in risk is for any type of birth defect but is greatest for heart, limb, and neural tube defects.  The risk could be as high as 6-10% for individuals whose blood sugars are not well-controlled, but lower for women who have good blood sugar control throughout pregnancy.    I counseled this couple regarding the above risks and available options.  The approximate face-to-face time with the genetic counselor was 40 minutes.  Quinn Plowman, MS,  Certified Genetic Counselor 09/22/2014

## 2014-09-29 ENCOUNTER — Other Ambulatory Visit: Payer: Self-pay | Admitting: Obstetrics & Gynecology

## 2014-09-29 DIAGNOSIS — E569 Vitamin deficiency, unspecified: Secondary | ICD-10-CM

## 2014-09-29 MED ORDER — VITAMIN D 50 MCG (2000 UT) PO TABS: 2000.0000 [IU] | ORAL_TABLET | Freq: Every day | ORAL | Status: AC

## 2014-09-29 NOTE — Progress Notes (Signed)
Vit D deficiency.  Will treat with 2000 units for 6 weeks and recheck in 6 weeks.  All of other patient's labs were cancelled (??whi and by whom??).  They need to be drawn at next visit.

## 2014-10-02 ENCOUNTER — Ambulatory Visit (INDEPENDENT_AMBULATORY_CARE_PROVIDER_SITE_OTHER): Payer: 59 | Admitting: Family Medicine

## 2014-10-02 VITALS — BP 122/58 | HR 94 | Temp 98.2°F | Wt 203.0 lb

## 2014-10-02 DIAGNOSIS — E10359 Type 1 diabetes mellitus with proliferative diabetic retinopathy without macular edema: Secondary | ICD-10-CM

## 2014-10-02 DIAGNOSIS — E103599 Type 1 diabetes mellitus with proliferative diabetic retinopathy without macular edema, unspecified eye: Secondary | ICD-10-CM

## 2014-10-02 DIAGNOSIS — E10319 Type 1 diabetes mellitus with unspecified diabetic retinopathy without macular edema: Secondary | ICD-10-CM | POA: Insufficient documentation

## 2014-10-02 DIAGNOSIS — O99840 Bariatric surgery status complicating pregnancy, unspecified trimester: Secondary | ICD-10-CM

## 2014-10-02 DIAGNOSIS — O24912 Unspecified diabetes mellitus in pregnancy, second trimester: Secondary | ICD-10-CM

## 2014-10-02 DIAGNOSIS — O0992 Supervision of high risk pregnancy, unspecified, second trimester: Secondary | ICD-10-CM

## 2014-10-02 LAB — POCT URINALYSIS DIP (DEVICE)
BILIRUBIN URINE: NEGATIVE
GLUCOSE, UA: NEGATIVE mg/dL
KETONES UR: NEGATIVE mg/dL
NITRITE: NEGATIVE
PH: 7 (ref 5.0–8.0)
Protein, ur: NEGATIVE mg/dL
Specific Gravity, Urine: 1.02 (ref 1.005–1.030)
Urobilinogen, UA: 0.2 mg/dL (ref 0.0–1.0)

## 2014-10-02 NOTE — Progress Notes (Signed)
Does not bring BS. CBS's fasting 70-80 2 hour pp 110's Continues to be Vitamin D deficient--for repletion Check B12 and Folate and B1 levels today Had NIPS results not back yet. 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

## 2014-10-02 NOTE — Progress Notes (Signed)
Pt has questions about blood pressure

## 2014-10-02 NOTE — Progress Notes (Signed)
Patient to have remaining labs drawn today B12, B1 and folate today. Called [facility] [phone]-- courier to come and obtain specimens.

## 2014-10-02 NOTE — Progress Notes (Signed)
Patient here in clinic for appointment. Informed of need to start Vit D 2000 units daily and explained that labs will be checked again in 6 weeks. All other labs that had been cancelled (B1, B12 and folate) will be drawn today and sent to [facility] per patient request.

## 2014-10-02 NOTE — Patient Instructions (Signed)
Breastfeeding Deciding to breastfeed is one of the best choices you can make for you and your baby. A change in hormones during pregnancy causes your breast tissue to grow and increases the number and size of your milk ducts. These hormones also allow proteins, sugars, and fats from your blood supply to make breast milk in your milk-producing glands. Hormones prevent breast milk from being released before your baby is born as well as prompt milk flow after birth. Once breastfeeding has begun, thoughts of your baby, as well as his or her sucking or crying, can stimulate the release of milk from your milk-producing glands.  BENEFITS OF BREASTFEEDING For Your Baby  Your first milk (colostrum) helps your baby's digestive system function better.   There are antibodies in your milk that help your baby fight off infections.   Your baby has a lower incidence of asthma, allergies, and sudden infant death syndrome.   The nutrients in breast milk are better for your baby than infant formulas and are designed uniquely for your baby's needs.   Breast milk improves your baby's brain development.   Your baby is less likely to develop other conditions, such as childhood obesity, asthma, or type 2 diabetes mellitus.  For You   Breastfeeding helps to create a very special bond between you and your baby.   Breastfeeding is convenient. Breast milk is always available at the correct temperature and costs nothing.   Breastfeeding helps to burn calories and helps you lose the weight gained during pregnancy.   Breastfeeding makes your uterus contract to its prepregnancy size faster and slows bleeding (lochia) after you give birth.   Breastfeeding helps to lower your risk of developing type 2 diabetes mellitus, osteoporosis, and breast or ovarian cancer later in life. SIGNS THAT YOUR BABY IS HUNGRY Early Signs of Hunger  Increased alertness or activity.  Stretching.  Movement of the head from  side to side.  Movement of the head and opening of the mouth when the corner of the mouth or cheek is stroked (rooting).  Increased sucking sounds, smacking lips, cooing, sighing, or squeaking.  Hand-to-mouth movements.  Increased sucking of fingers or hands. Late Signs of Hunger  Fussing.  Intermittent crying. Extreme Signs of Hunger Signs of extreme hunger will require calming and consoling before your baby will be able to breastfeed successfully. Do not wait for the following signs of extreme hunger to occur before you initiate breastfeeding:   Restlessness.  A loud, strong cry.   Screaming. BREASTFEEDING BASICS Breastfeeding Initiation  Find a comfortable place to sit or lie down, with your neck and back well supported.  Place a pillow or rolled up blanket under your baby to bring him or her to the level of your breast (if you are seated). Nursing pillows are specially designed to help support your arms and your baby while you breastfeed.  Make sure that your baby's abdomen is facing your abdomen.   Gently massage your breast. With your fingertips, massage from your chest wall toward your nipple in a circular motion. This encourages milk flow. You may need to continue this action during the feeding if your milk flows slowly.  Support your breast with 4 fingers underneath and your thumb above your nipple. Make sure your fingers are well away from your nipple and your baby's mouth.   Stroke your baby's lips gently with your finger or nipple.   When your baby's mouth is open wide enough, quickly bring your baby to your   breast, placing your entire nipple and as much of the colored area around your nipple (areola) as possible into your baby's mouth.   More areola should be visible above your baby's upper lip than below the lower lip.   Your baby's tongue should be between his or her lower gum and your breast.   Ensure that your baby's mouth is correctly positioned  around your nipple (latched). Your baby's lips should create a seal on your breast and be turned out (everted).  It is common for your baby to suck about 2-3 minutes in order to start the flow of breast milk. Latching Teaching your baby how to latch on to your breast properly is very important. An improper latch can cause nipple pain and decreased milk supply for you and poor weight gain in your baby. Also, if your baby is not latched onto your nipple properly, he or she may swallow some air during feeding. This can make your baby fussy. Burping your baby when you switch breasts during the feeding can help to get rid of the air. However, teaching your baby to latch on properly is still the best way to prevent fussiness from swallowing air while breastfeeding. Signs that your baby has successfully latched on to your nipple:    Silent tugging or silent sucking, without causing you pain.   Swallowing heard between every 3-4 sucks.    Muscle movement above and in front of his or her ears while sucking.  Signs that your baby has not successfully latched on to nipple:   Sucking sounds or smacking sounds from your baby while breastfeeding.  Nipple pain. If you think your baby has not latched on correctly, slip your finger into the corner of your baby's mouth to break the suction and place it between your baby's gums. Attempt breastfeeding initiation again. Signs of Successful Breastfeeding Signs from your baby:   A gradual decrease in the number of sucks or complete cessation of sucking.   Falling asleep.   Relaxation of his or her body.   Retention of a small amount of milk in his or her mouth.   Letting go of your breast by himself or herself. Signs from you:  Breasts that have increased in firmness, weight, and size 1-3 hours after feeding.   Breasts that are softer immediately after breastfeeding.  Increased milk volume, as well as a change in milk consistency and color by  the fifth day of breastfeeding.   Nipples that are not sore, cracked, or bleeding. Signs That Your Baby is Getting Enough Milk  Wetting at least 3 diapers in a 24-hour period. The urine should be clear and pale yellow by age 5 days.  At least 3 stools in a 24-hour period by age 5 days. The stool should be soft and yellow.  At least 3 stools in a 24-hour period by age 7 days. The stool should be seedy and yellow.  No loss of weight greater than 10% of birth weight during the first 3 days of age.  Average weight gain of 4-7 ounces (113-198 g) per week after age 4 days.  Consistent daily weight gain by age 5 days, without weight loss after the age of 2 weeks. After a feeding, your baby may spit up a small amount. This is common. BREASTFEEDING FREQUENCY AND DURATION Frequent feeding will help you make more milk and can prevent sore nipples and breast engorgement. Breastfeed when you feel the need to reduce the fullness of your breasts   or when your baby shows signs of hunger. This is called "breastfeeding on demand." Avoid introducing a pacifier to your baby while you are working to establish breastfeeding (the first 4-6 weeks after your baby is born). After this time you may choose to use a pacifier. Research has shown that pacifier use during the first year of a baby's life decreases the risk of sudden infant death syndrome (SIDS). Allow your baby to feed on each breast as long as he or she wants. Breastfeed until your baby is finished feeding. When your baby unlatches or falls asleep while feeding from the first breast, offer the second breast. Because newborns are often sleepy in the first few weeks of life, you may need to awaken your baby to get him or her to feed. Breastfeeding times will vary from baby to baby. However, the following rules can serve as a guide to help you ensure that your baby is properly fed:  Newborns (babies 4 weeks of age or younger) may breastfeed every 1-3  hours.  Newborns should not go longer than 3 hours during the day or 5 hours during the night without breastfeeding.  You should breastfeed your baby a minimum of 8 times in a 24-hour period until you begin to introduce solid foods to your baby at around 6 months of age. BREAST MILK PUMPING Pumping and storing breast milk allows you to ensure that your baby is exclusively fed your breast milk, even at times when you are unable to breastfeed. This is especially important if you are going back to work while you are still breastfeeding or when you are not able to be present during feedings. Your lactation consultant can give you guidelines on how long it is safe to store breast milk.  A breast pump is a machine that allows you to pump milk from your breast into a sterile bottle. The pumped breast milk can then be stored in a refrigerator or freezer. Some breast pumps are operated by hand, while others use electricity. Ask your lactation consultant which type will work best for you. Breast pumps can be purchased, but some hospitals and breastfeeding support groups lease breast pumps on a monthly basis. A lactation consultant can teach you how to hand express breast milk, if you prefer not to use a pump.  CARING FOR YOUR BREASTS WHILE YOU BREASTFEED Nipples can become dry, cracked, and sore while breastfeeding. The following recommendations can help keep your breasts moisturized and healthy:  Avoid using soap on your nipples.   Wear a supportive bra. Although not required, special nursing bras and tank tops are designed to allow access to your breasts for breastfeeding without taking off your entire bra or top. Avoid wearing underwire-style bras or extremely tight bras.  Air dry your nipples for 3-4minutes after each feeding.   Use only cotton bra pads to absorb leaked breast milk. Leaking of breast milk between feedings is normal.   Use lanolin on your nipples after breastfeeding. Lanolin helps to  maintain your skin's normal moisture barrier. If you use pure lanolin, you do not need to wash it off before feeding your baby again. Pure lanolin is not toxic to your baby. You may also hand express a few drops of breast milk and gently massage that milk into your nipples and allow the milk to air dry. In the first few weeks after giving birth, some women experience extremely full breasts (engorgement). Engorgement can make your breasts feel heavy, warm, and tender to the   touch. Engorgement peaks within 3-5 days after you give birth. The following recommendations can help ease engorgement:  Completely empty your breasts while breastfeeding or pumping. You may want to start by applying warm, moist heat (in the shower or with warm water-soaked hand towels) just before feeding or pumping. This increases circulation and helps the milk flow. If your baby does not completely empty your breasts while breastfeeding, pump any extra milk after he or she is finished.  Wear a snug bra (nursing or regular) or tank top for 1-2 days to signal your body to slightly decrease milk production.  Apply ice packs to your breasts, unless this is too uncomfortable for you.  Make sure that your baby is latched on and positioned properly while breastfeeding. If engorgement persists after 48 hours of following these recommendations, contact your health care provider or a lactation consultant. OVERALL HEALTH CARE RECOMMENDATIONS WHILE BREASTFEEDING  Eat healthy foods. Alternate between meals and snacks, eating 3 of each per day. Because what you eat affects your breast milk, some of the foods may make your baby more irritable than usual. Avoid eating these foods if you are sure that they are negatively affecting your baby.  Drink milk, fruit juice, and water to satisfy your thirst (about 10 glasses a day).   Rest often, relax, and continue to take your prenatal vitamins to prevent fatigue, stress, and anemia.  Continue  breast self-awareness checks.  Avoid chewing and smoking tobacco.  Avoid alcohol and drug use. Some medicines that may be harmful to your baby can pass through breast milk. It is important to ask your health care provider before taking any medicine, including all over-the-counter and prescription medicine as well as vitamin and herbal supplements. It is possible to become pregnant while breastfeeding. If birth control is desired, ask your health care provider about options that will be safe for your baby. SEEK MEDICAL CARE IF:   You feel like you want to stop breastfeeding or have become frustrated with breastfeeding.  You have painful breasts or nipples.  Your nipples are cracked or bleeding.  Your breasts are red, tender, or warm.  You have a swollen area on either breast.  You have a fever or chills.  You have nausea or vomiting.  You have drainage other than breast milk from your nipples.  Your breasts do not become full before feedings by the fifth day after you give birth.  You feel sad and depressed.  Your baby is too sleepy to eat well.  Your baby is having trouble sleeping.   Your baby is wetting less than 3 diapers in a 24-hour period.  Your baby has less than 3 stools in a 24-hour period.  Your baby's skin or the white part of his or her eyes becomes yellow.   Your baby is not gaining weight by 5 days of age. SEEK IMMEDIATE MEDICAL CARE IF:   Your baby is overly tired (lethargic) and does not want to wake up and feed.  Your baby develops an unexplained fever. Document Released: 05/12/2005 Document Revised: 05/17/2013 Document Reviewed: 11/03/2012 [employer] Patient Information 2015 [employer]. This information is not intended to replace advice given to you by your health care provider. Make sure you discuss any questions you have with your health care provider.  

## 2014-10-03 LAB — CALCIUM: CALCIUM: 9.1 mg/dL (ref 8.7–10.2)

## 2014-10-05 ENCOUNTER — Other Ambulatory Visit (HOSPITAL_COMMUNITY): Payer: Self-pay | Admitting: Obstetrics & Gynecology

## 2014-10-05 ENCOUNTER — Telehealth (HOSPITAL_COMMUNITY): Payer: Self-pay | Admitting: MS"

## 2014-10-05 NOTE — Telephone Encounter (Signed)
Called Melissa Braun to discuss her cell free fetal DNA test results.  Mrs. Melissa Braun had InformaSeq testing through [facility].  Testing was offered because of maternal age.   The patient was identified by name and DOB.  We reviewed that these are within normal limits for trisomies 21, 18 and 13, and monosomy X (Turner syndrome).   This testing identifies > 99% of pregnancies with trisomy 21, >98% of pregnancies with trisomy 13 and 18, and 95% of pregnancies with monosomy X.  Testing was also consistent with female fetal sex.  The patient did wish to know fetal sex.  She understands that this testing does not identify all genetic conditions.  All questions were answered to her satisfaction, she was encouraged to call with additional questions or concerns.  Melissa PlowmanKaren Palmina Clodfelter, MS Certified Genetic Counselor 10/05/2014 9:09 AM

## 2014-10-09 ENCOUNTER — Ambulatory Visit (INDEPENDENT_AMBULATORY_CARE_PROVIDER_SITE_OTHER): Payer: 59 | Admitting: Obstetrics and Gynecology

## 2014-10-09 VITALS — BP 129/66 | HR 92 | Temp 98.6°F | Wt 200.1 lb

## 2014-10-09 DIAGNOSIS — O0992 Supervision of high risk pregnancy, unspecified, second trimester: Secondary | ICD-10-CM

## 2014-10-09 LAB — POCT URINALYSIS DIP (DEVICE)
BILIRUBIN URINE: NEGATIVE
Glucose, UA: NEGATIVE mg/dL
Hgb urine dipstick: NEGATIVE
NITRITE: NEGATIVE
PH: 5.5 (ref 5.0–8.0)
Protein, ur: NEGATIVE mg/dL
Specific Gravity, Urine: 1.015 (ref 1.005–1.030)
UROBILINOGEN UA: 0.2 mg/dL (ref 0.0–1.0)

## 2014-10-09 NOTE — Progress Notes (Signed)
Edema in left foot.

## 2014-10-09 NOTE — Progress Notes (Signed)
Moderate leukocytes noted on urinalysis. 

## 2014-10-09 NOTE — Progress Notes (Signed)
Follows up for blood glucose. All AM fasting are at goal  2 hours - only 16 of 56 are above goal. (non higher than 135)  Will continue on 70/30 24 u in AM and 12 u in PM Has started baby ASA Has optho exam next month - had prior to pregnancy and all was fine (no signs of DMII)  US scheduled for 6/10

## 2014-10-10 LAB — VITAMIN B12: Vitamin B-12: 240 pg/mL (ref 211–946)

## 2014-10-10 LAB — FOLATE RBC
Folate, Hemolysate: 414.6 ng/mL
Folate, RBC: 1191 ng/mL (ref >498)
HEMATOCRIT: 34.8 % (ref 34.0–46.6)

## 2014-10-10 LAB — VITAMIN B1: THIAMINE: 119.5 nmol/L (ref 66.5–200.0)

## 2014-10-30 ENCOUNTER — Other Ambulatory Visit: Payer: Self-pay | Admitting: Family Medicine

## 2014-10-30 ENCOUNTER — Ambulatory Visit (INDEPENDENT_AMBULATORY_CARE_PROVIDER_SITE_OTHER): Payer: 59 | Admitting: Family Medicine

## 2014-10-30 VITALS — BP 133/55 | HR 93 | Temp 98.8°F | Wt 205.1 lb

## 2014-10-30 DIAGNOSIS — O10019 Pre-existing essential hypertension complicating pregnancy, unspecified trimester: Secondary | ICD-10-CM

## 2014-10-30 DIAGNOSIS — O09523 Supervision of elderly multigravida, third trimester: Secondary | ICD-10-CM

## 2014-10-30 DIAGNOSIS — O0992 Supervision of high risk pregnancy, unspecified, second trimester: Secondary | ICD-10-CM

## 2014-10-30 DIAGNOSIS — O24912 Unspecified diabetes mellitus in pregnancy, second trimester: Secondary | ICD-10-CM

## 2014-10-30 LAB — POCT URINALYSIS DIP (DEVICE)
Bilirubin Urine: NEGATIVE
Glucose, UA: NEGATIVE mg/dL
HGB URINE DIPSTICK: NEGATIVE
Ketones, ur: NEGATIVE mg/dL
Nitrite: NEGATIVE
Protein, ur: NEGATIVE mg/dL
Specific Gravity, Urine: 1.02 (ref 1.005–1.030)
Urobilinogen, UA: 0.2 mg/dL (ref 0.0–1.0)
pH: 5 (ref 5.0–8.0)

## 2014-10-30 MED ORDER — NOVOLOG MIX 70/30 FLEXPEN (70-30) 100 UNIT/ML ~~LOC~~ SUPN: 12.0000 [IU] | PEN_INJECTOR | Freq: Two times a day (BID) | SUBCUTANEOUS | Status: DC

## 2014-10-30 NOTE — Patient Instructions (Signed)
Gestational Diabetes Mellitus Gestational diabetes mellitus, often simply referred to as gestational diabetes, is a type of diabetes that some women develop during pregnancy. In gestational diabetes, the pancreas does not make enough insulin (a hormone), the cells are less responsive to the insulin that is made (insulin resistance), or both.Normally, insulin moves sugars from food into the tissue cells. The tissue cells use the sugars for energy. The lack of insulin or the lack of normal response to insulin causes excess sugars to build up in the blood instead of going into the tissue cells. As a result, high blood sugar (hyperglycemia) develops. The effect of high sugar (glucose) levels can cause many problems.  RISK FACTORS You have an increased chance of developing gestational diabetes if you have a family history of diabetes and also have one or more of the following risk factors:  A body mass index over 30 (obesity).  A previous pregnancy with gestational diabetes.  An older age at the time of pregnancy. If blood glucose levels are kept in the normal range during pregnancy, women can have a healthy pregnancy. If your blood glucose levels are not well controlled, there may be risks to you, your unborn baby (fetus), your labor and delivery, or your newborn baby.  SYMPTOMS  If symptoms are experienced, they are much like symptoms you would normally expect during pregnancy. The symptoms of gestational diabetes include:   Increased thirst (polydipsia).  Increased urination (polyuria).  Increased urination during the night (nocturia).  Weight loss. This weight loss may be rapid.  Frequent, recurring infections.  Tiredness (fatigue).  Weakness.  Vision changes, such as blurred vision.  Fruity smell to your breath.  Abdominal pain. DIAGNOSIS Diabetes is diagnosed when blood glucose levels are increased. Your blood glucose level may be checked by one or more of the following blood  tests:  A fasting blood glucose test. You will not be allowed to eat for at least 8 hours before a blood sample is taken.  A random blood glucose test. Your blood glucose is checked at any time of the day regardless of when you ate.  A hemoglobin A1c blood glucose test. A hemoglobin A1c test provides information about blood glucose control over the previous 3 months.  An oral glucose tolerance test (OGTT). Your blood glucose is measured after you have not eaten (fasted) for 1-3 hours and then after you drink a glucose-containing beverage. Since the hormones that cause insulin resistance are highest at about 24-28 weeks of a pregnancy, an OGTT is usually performed during that time. If you have risk factors for gestational diabetes, your health care provider may test you for gestational diabetes earlier than 24 weeks of pregnancy. TREATMENT   You will need to take diabetes medicine or insulin daily to keep blood glucose levels in the desired range.  You will need to match insulin dosing with exercise and healthy food choices. The treatment goal is to maintain the before-meal (preprandial), bedtime, and overnight blood glucose level at 60-99 mg/dL during pregnancy. The treatment goal is to further maintain peak after-meal blood sugar (postprandial glucose) level at 100-140 mg/dL. HOME CARE INSTRUCTIONS   Have your hemoglobin A1c level checked twice a year.  Perform daily blood glucose monitoring as directed by your health care provider. It is common to perform frequent blood glucose monitoring.  Monitor urine ketones when you are ill and as directed by your health care provider.  Take your diabetes medicine and insulin as directed by your health care provider   to maintain your blood glucose level in the desired range.  Never run out of diabetes medicine or insulin. It is needed every day.  Adjust insulin based on your intake of carbohydrates. Carbohydrates can raise blood glucose levels but  need to be included in your diet. Carbohydrates provide vitamins, minerals, and fiber which are an essential part of a healthy diet. Carbohydrates are found in fruits, vegetables, whole grains, dairy products, legumes, and foods containing added sugars.  Eat healthy foods. Alternate 3 meals with 3 snacks.  Maintain a healthy weight gain. The usual total expected weight gain varies according to your prepregnancy body mass index (BMI).  Carry a medical alert card or wear your medical alert jewelry.  Carry a 15-gram carbohydrate snack with you at all times to treat low blood glucose (hypoglycemia). Some examples of 15-gram carbohydrate snacks include:  Glucose tablets, 3 or 4.  Glucose gel, 15-gram tube.  Raisins, 2 tablespoons (24 g).  Jelly beans, 6.  Animal crackers, 8.  Fruit juice, regular soda, or low-fat milk, 4 ounces (120 mL).  Gummy treats, 9.  Recognize hypoglycemia. Hypoglycemia during pregnancy occurs with blood glucose levels of 60 mg/dL and below. The risk for hypoglycemia increases when fasting or skipping meals, during or after intense exercise, and during sleep. Hypoglycemia symptoms can include:  Tremors or shakes.  Decreased ability to concentrate.  Sweating.  Increased heart rate.  Headache.  Dry mouth.  Hunger.  Irritability.  Anxiety.  Restless sleep.  Altered speech or coordination.  Confusion.  Treat hypoglycemia promptly. If you are alert and able to safely swallow, follow the 15:15 rule:  Take 15-20 grams of rapid-acting glucose or carbohydrate. Rapid-acting options include glucose gel, glucose tablets, or 4 ounces (120 mL) of fruit juice, regular soda, or low-fat milk.  Check your blood glucose level 15 minutes after taking the glucose.  Take 15-20 grams more of glucose if the repeat blood glucose level is still 70 mg/dL or below.  Eat a meal or snack within 1 hour once blood glucose levels return to normal.  Be alert to polyuria  (excess urination) and polydipsia (excess thirst) which are early signs of hyperglycemia. An early awareness of hyperglycemia allows for prompt treatment. Treat hyperglycemia as directed by your health care provider.  Engage in at least 30 minutes of physical activity a day or as directed by your health care provider. Ten minutes of physical activity timed 30 minutes after each meal is encouraged to control postprandial blood glucose levels.  Adjust your insulin dosing and food intake as needed if you start a new exercise or sport.  Follow your sick-day plan at any time you are unable to eat or drink as usual.  Avoid tobacco and alcohol use.  Keep all follow-up visits as directed by your health care provider.  Follow the advice of your health care provider regarding your prenatal and post-delivery (postpartum) appointments, meal planning, exercise, medicines, vitamins, blood tests, other medical tests, and physical activities.  Perform daily skin and foot care. Examine your skin and feet daily for cuts, bruises, redness, nail problems, bleeding, blisters, or sores.  Brush your teeth and gums at least twice a day and floss at least once a day. Follow up with your dentist regularly.  Schedule an eye exam during the first trimester of your pregnancy or as directed by your health care provider.  Share your diabetes management plan with your workplace or school.  Stay up-to-date with immunizations.  Learn to manage stress.    Obtain ongoing diabetes education and support as needed.  Learn about and consider breastfeeding your baby.  You should have your blood sugar level checked 6-12 weeks after delivery. This is done with an oral glucose tolerance test (OGTT). SEEK MEDICAL CARE IF:   You are unable to eat food or drink fluids for more than 6 hours.  You have nausea and vomiting for more than 6 hours.  You have a blood glucose level of 200 mg/dL and you have ketones in your  urine.  There is a change in mental status.  You develop vision problems.  You have a persistent headache.  You have upper abdominal pain or discomfort.  You develop an additional serious illness.  You have diarrhea for more than 6 hours.  You have been sick or have had a fever for a couple of days and are not getting better. SEEK IMMEDIATE MEDICAL CARE IF:   You have difficulty breathing.  You no longer feel the baby moving.  You are bleeding or have discharge from your vagina.  You start having premature contractions or labor. MAKE SURE YOU:  Understand these instructions.  Will watch your condition.  Will get help right away if you are not doing well or get worse. Document Released: 08/18/2000 Document Revised: 09/26/2013 Document Reviewed: 12/09/2011 [employer] Patient Information 2015 [employer]. This information is not intended to replace advice given to you by your health care provider. Make sure you discuss any questions you have with your health care provider.  Breastfeeding Deciding to breastfeed is one of the best choices you can make for you and your baby. A change in hormones during pregnancy causes your breast tissue to grow and increases the number and size of your milk ducts. These hormones also allow proteins, sugars, and fats from your blood supply to make breast milk in your milk-producing glands. Hormones prevent breast milk from being released before your baby is born as well as prompt milk flow after birth. Once breastfeeding has begun, thoughts of your baby, as well as his or her sucking or crying, can stimulate the release of milk from your milk-producing glands.  BENEFITS OF BREASTFEEDING For Your Baby  Your first milk (colostrum) helps your baby's digestive system function better.   There are antibodies in your milk that help your baby fight off infections.   Your baby has a lower incidence of asthma, allergies, and sudden infant death  syndrome.   The nutrients in breast milk are better for your baby than infant formulas and are designed uniquely for your baby's needs.   Breast milk improves your baby's brain development.   Your baby is less likely to develop other conditions, such as childhood obesity, asthma, or type 2 diabetes mellitus.  For You   Breastfeeding helps to create a very special bond between you and your baby.   Breastfeeding is convenient. Breast milk is always available at the correct temperature and costs nothing.   Breastfeeding helps to burn calories and helps you lose the weight gained during pregnancy.   Breastfeeding makes your uterus contract to its prepregnancy size faster and slows bleeding (lochia) after you give birth.   Breastfeeding helps to lower your risk of developing type 2 diabetes mellitus, osteoporosis, and breast or ovarian cancer later in life. SIGNS THAT YOUR BABY IS HUNGRY Early Signs of Hunger  Increased alertness or activity.  Stretching.  Movement of the head from side to side.  Movement of the head and opening of the   mouth when the corner of the mouth or cheek is stroked (rooting).  Increased sucking sounds, smacking lips, cooing, sighing, or squeaking.  Hand-to-mouth movements.  Increased sucking of fingers or hands. Late Signs of Hunger  Fussing.  Intermittent crying. Extreme Signs of Hunger Signs of extreme hunger will require calming and consoling before your baby will be able to breastfeed successfully. Do not wait for the following signs of extreme hunger to occur before you initiate breastfeeding:   Restlessness.  A loud, strong cry.   Screaming. BREASTFEEDING BASICS Breastfeeding Initiation  Find a comfortable place to sit or lie down, with your neck and back well supported.  Place a pillow or rolled up blanket under your baby to bring him or her to the level of your breast (if you are seated). Nursing pillows are specially designed  to help support your arms and your baby while you breastfeed.  Make sure that your baby's abdomen is facing your abdomen.   Gently massage your breast. With your fingertips, massage from your chest wall toward your nipple in a circular motion. This encourages milk flow. You may need to continue this action during the feeding if your milk flows slowly.  Support your breast with 4 fingers underneath and your thumb above your nipple. Make sure your fingers are well away from your nipple and your baby's mouth.   Stroke your baby's lips gently with your finger or nipple.   When your baby's mouth is open wide enough, quickly bring your baby to your breast, placing your entire nipple and as much of the colored area around your nipple (areola) as possible into your baby's mouth.   More areola should be visible above your baby's upper lip than below the lower lip.   Your baby's tongue should be between his or her lower gum and your breast.   Ensure that your baby's mouth is correctly positioned around your nipple (latched). Your baby's lips should create a seal on your breast and be turned out (everted).  It is common for your baby to suck about 2-3 minutes in order to start the flow of breast milk. Latching Teaching your baby how to latch on to your breast properly is very important. An improper latch can cause nipple pain and decreased milk supply for you and poor weight gain in your baby. Also, if your baby is not latched onto your nipple properly, he or she may swallow some air during feeding. This can make your baby fussy. Burping your baby when you switch breasts during the feeding can help to get rid of the air. However, teaching your baby to latch on properly is still the best way to prevent fussiness from swallowing air while breastfeeding. Signs that your baby has successfully latched on to your nipple:    Silent tugging or silent sucking, without causing you pain.   Swallowing  heard between every 3-4 sucks.    Muscle movement above and in front of his or her ears while sucking.  Signs that your baby has not successfully latched on to nipple:   Sucking sounds or smacking sounds from your baby while breastfeeding.  Nipple pain. If you think your baby has not latched on correctly, slip your finger into the corner of your baby's mouth to break the suction and place it between your baby's gums. Attempt breastfeeding initiation again. Signs of Successful Breastfeeding Signs from your baby:   A gradual decrease in the number of sucks or complete cessation of sucking.     Falling asleep.   Relaxation of his or her body.   Retention of a small amount of milk in his or her mouth.   Letting go of your breast by himself or herself. Signs from you:  Breasts that have increased in firmness, weight, and size 1-3 hours after feeding.   Breasts that are softer immediately after breastfeeding.  Increased milk volume, as well as a change in milk consistency and color by the fifth day of breastfeeding.   Nipples that are not sore, cracked, or bleeding. Signs That Your Baby is Getting Enough Milk  Wetting at least 3 diapers in a 24-hour period. The urine should be clear and pale yellow by age 5 days.  At least 3 stools in a 24-hour period by age 5 days. The stool should be soft and yellow.  At least 3 stools in a 24-hour period by age 7 days. The stool should be seedy and yellow.  No loss of weight greater than 10% of birth weight during the first 3 days of age.  Average weight gain of 4-7 ounces (113-198 g) per week after age 4 days.  Consistent daily weight gain by age 5 days, without weight loss after the age of 2 weeks. After a feeding, your baby may spit up a small amount. This is common. BREASTFEEDING FREQUENCY AND DURATION Frequent feeding will help you make more milk and can prevent sore nipples and breast engorgement. Breastfeed when you feel the  need to reduce the fullness of your breasts or when your baby shows signs of hunger. This is called "breastfeeding on demand." Avoid introducing a pacifier to your baby while you are working to establish breastfeeding (the first 4-6 weeks after your baby is born). After this time you may choose to use a pacifier. Research has shown that pacifier use during the first year of a baby's life decreases the risk of sudden infant death syndrome (SIDS). Allow your baby to feed on each breast as long as he or she wants. Breastfeed until your baby is finished feeding. When your baby unlatches or falls asleep while feeding from the first breast, offer the second breast. Because newborns are often sleepy in the first few weeks of life, you may need to awaken your baby to get him or her to feed. Breastfeeding times will vary from baby to baby. However, the following rules can serve as a guide to help you ensure that your baby is properly fed:  Newborns (babies 4 weeks of age or younger) may breastfeed every 1-3 hours.  Newborns should not go longer than 3 hours during the day or 5 hours during the night without breastfeeding.  You should breastfeed your baby a minimum of 8 times in a 24-hour period until you begin to introduce solid foods to your baby at around 6 months of age. BREAST MILK PUMPING Pumping and storing breast milk allows you to ensure that your baby is exclusively fed your breast milk, even at times when you are unable to breastfeed. This is especially important if you are going back to work while you are still breastfeeding or when you are not able to be present during feedings. Your lactation consultant can give you guidelines on how long it is safe to store breast milk.  A breast pump is a machine that allows you to pump milk from your breast into a sterile bottle. The pumped breast milk can then be stored in a refrigerator or freezer. Some breast pumps are operated by   hand, while others use  electricity. Ask your lactation consultant which type will work best for you. Breast pumps can be purchased, but some hospitals and breastfeeding support groups lease breast pumps on a monthly basis. A lactation consultant can teach you how to hand express breast milk, if you prefer not to use a pump.  CARING FOR YOUR BREASTS WHILE YOU BREASTFEED Nipples can become dry, cracked, and sore while breastfeeding. The following recommendations can help keep your breasts moisturized and healthy:  Avoid using soap on your nipples.   Wear a supportive bra. Although not required, special nursing bras and tank tops are designed to allow access to your breasts for breastfeeding without taking off your entire bra or top. Avoid wearing underwire-style bras or extremely tight bras.  Air dry your nipples for 3-4minutes after each feeding.   Use only cotton bra pads to absorb leaked breast milk. Leaking of breast milk between feedings is normal.   Use lanolin on your nipples after breastfeeding. Lanolin helps to maintain your skin's normal moisture barrier. If you use pure lanolin, you do not need to wash it off before feeding your baby again. Pure lanolin is not toxic to your baby. You may also hand express a few drops of breast milk and gently massage that milk into your nipples and allow the milk to air dry. In the first few weeks after giving birth, some women experience extremely full breasts (engorgement). Engorgement can make your breasts feel heavy, warm, and tender to the touch. Engorgement peaks within 3-5 days after you give birth. The following recommendations can help ease engorgement:  Completely empty your breasts while breastfeeding or pumping. You may want to start by applying warm, moist heat (in the shower or with warm water-soaked hand towels) just before feeding or pumping. This increases circulation and helps the milk flow. If your baby does not completely empty your breasts while  breastfeeding, pump any extra milk after he or she is finished.  Wear a snug bra (nursing or regular) or tank top for 1-2 days to signal your body to slightly decrease milk production.  Apply ice packs to your breasts, unless this is too uncomfortable for you.  Make sure that your baby is latched on and positioned properly while breastfeeding. If engorgement persists after 48 hours of following these recommendations, contact your health care provider or a lactation consultant. OVERALL HEALTH CARE RECOMMENDATIONS WHILE BREASTFEEDING  Eat healthy foods. Alternate between meals and snacks, eating 3 of each per day. Because what you eat affects your breast milk, some of the foods may make your baby more irritable than usual. Avoid eating these foods if you are sure that they are negatively affecting your baby.  Drink milk, fruit juice, and water to satisfy your thirst (about 10 glasses a day).   Rest often, relax, and continue to take your prenatal vitamins to prevent fatigue, stress, and anemia.  Continue breast self-awareness checks.  Avoid chewing and smoking tobacco.  Avoid alcohol and drug use. Some medicines that may be harmful to your baby can pass through breast milk. It is important to ask your health care provider before taking any medicine, including all over-the-counter and prescription medicine as well as vitamin and herbal supplements. It is possible to become pregnant while breastfeeding. If birth control is desired, ask your health care provider about options that will be safe for your baby. SEEK MEDICAL CARE IF:   You feel like you want to stop breastfeeding or have become   frustrated with breastfeeding.  You have painful breasts or nipples.  Your nipples are cracked or bleeding.  Your breasts are red, tender, or warm.  You have a swollen area on either breast.  You have a fever or chills.  You have nausea or vomiting.  You have drainage other than breast milk from  your nipples.  Your breasts do not become full before feedings by the fifth day after you give birth.  You feel sad and depressed.  Your baby is too sleepy to eat well.  Your baby is having trouble sleeping.   Your baby is wetting less than 3 diapers in a 24-hour period.  Your baby has less than 3 stools in a 24-hour period.  Your baby's skin or the white part of his or her eyes becomes yellow.   Your baby is not gaining weight by 5 days of age. SEEK IMMEDIATE MEDICAL CARE IF:   Your baby is overly tired (lethargic) and does not want to wake up and feed.  Your baby develops an unexplained fever. Document Released: 05/12/2005 Document Revised: 05/17/2013 Document Reviewed: 11/03/2012 [employer] Patient Information 2015 [employer]. This information is not intended to replace advice given to you by your health care provider. Make sure you discuss any questions you have with your health care provider.  

## 2014-10-30 NOTE — Progress Notes (Signed)
Fetal echo scheduled with Dr Elizebeth Brookingotton on 12/06/2014 @ 1:00 PM Frankey Poot(D Biggs, RN)

## 2014-10-30 NOTE — Progress Notes (Signed)
Small leuks noted in urine.  Needs refill of Novolog Flex pen.

## 2014-10-30 NOTE — Progress Notes (Signed)
Subjective:   Melissa Braun is a 39 y.o. G2P1001 at 215w5d being seen today for her obstetrical visit.  Patient reports no complaints.   Contractions: Contractions: Not present.   Vaginal Bleeding Vag. Bleeding: None.   Fetal Movement:   Not yet.   The following portions of the patient's history were reviewed and updated as appropriate: allergies, current medications, past family history, past medical history, past social history, past surgical history and problem list.   Objective:  BP 133/55 mmHg  Pulse 93  Temp(Src) 98.8 F (37.1 C)  Wt 205 lb 1.6 oz (93.033 kg)  LMP 06/28/2014 Fetal Heart Rate:  145   Abdomen: Soft, gravid, appropriate for gestational age.  Pain/Pressure: Pain/Pressure: Absent     Vaginal: Vaginal Bleeding Vag. Bleeding: None     Extremities: Edema: Edema: Trace   Urinalysis:  Results for orders placed or performed in visit on 10/30/14 (from the past 24 hour(s))  POCT urinalysis dip (device)     Status: Abnormal   Collection Time: 10/30/14  8:02 AM  Result Value Ref Range   Glucose, UA NEGATIVE NEGATIVE mg/dL   Bilirubin Urine NEGATIVE NEGATIVE   Ketones, ur NEGATIVE NEGATIVE mg/dL   Specific Gravity, Urine 1.020 1.005 - 1.030   Hgb urine dipstick NEGATIVE NEGATIVE   pH 5.0 5.0 - 8.0   Protein, ur NEGATIVE NEGATIVE mg/dL   Urobilinogen, UA 0.2 0.0 - 1.0 mg/dL   Nitrite NEGATIVE NEGATIVE   Leukocytes, UA SMALL (A) NEGATIVE   FBS 62-85 2 hour pp 77-135 almost all are in range.  Assessment and Plan:   Pregnancy:  G2P1001 at 2615w5d  1. Supervision of high risk pregnancy, antepartum, second trimester  - Alpha fetoprotein, maternal - Anatomy u/s scheduled for Friday this week.  2. AMA (advanced maternal age) multigravida 35+, third trimester - Normal NIPS  3. Benign essential hypertension antepartum - BP is well controlled  4. Diabetes mellitus during pregnancy, antepartum, second trimester - Ambulatory referral to Pediatric Cardiology for  fetal echo - NOVOLOG MIX 70/30 FLEXPEN (70-30) 100 UNIT/ML FlexPen; Inject 0.12-0.24 mLs (12-24 Units total) into the skin 2 (two) times daily with a meal.  Dispense: 15 mL; Refill: 3 - Has appointment to see opthalmology   Fetal movement precautions reviewed.  Follow up in 3 weeks.   Reva Boresanya S Simone Tuckey, MD

## 2014-11-03 ENCOUNTER — Ambulatory Visit (HOSPITAL_COMMUNITY)
Admission: RE | Admit: 2014-11-03 | Discharge: 2014-11-03 | Disposition: A | Discharge status: Home / Self Care | Payer: 59 | Source: Ambulatory Visit | Attending: Obstetrics and Gynecology | Admitting: Obstetrics and Gynecology

## 2014-11-03 ENCOUNTER — Encounter (HOSPITAL_COMMUNITY): Payer: Self-pay

## 2014-11-03 DIAGNOSIS — O09522 Supervision of elderly multigravida, second trimester: Secondary | ICD-10-CM

## 2014-11-03 DIAGNOSIS — Z3A18 18 weeks gestation of pregnancy: Secondary | ICD-10-CM | POA: Insufficient documentation

## 2014-11-03 DIAGNOSIS — O24912 Unspecified diabetes mellitus in pregnancy, second trimester: Secondary | ICD-10-CM

## 2014-11-03 DIAGNOSIS — Z36 Encounter for antenatal screening of mother: Secondary | ICD-10-CM | POA: Insufficient documentation

## 2014-11-03 DIAGNOSIS — IMO0002 Reserved for concepts with insufficient information to code with codable children: Secondary | ICD-10-CM

## 2014-11-03 DIAGNOSIS — O3421 Maternal care for scar from previous cesarean delivery: Secondary | ICD-10-CM | POA: Diagnosis not present

## 2014-11-03 DIAGNOSIS — O352XX0 Maternal care for (suspected) hereditary disease in fetus, not applicable or unspecified: Secondary | ICD-10-CM | POA: Insufficient documentation

## 2014-11-03 DIAGNOSIS — O34219 Maternal care for unspecified type scar from previous cesarean delivery: Secondary | ICD-10-CM

## 2014-11-03 DIAGNOSIS — O24312 Unspecified pre-existing diabetes mellitus in pregnancy, second trimester: Secondary | ICD-10-CM | POA: Insufficient documentation

## 2014-11-03 DIAGNOSIS — Z0489 Encounter for examination and observation for other specified reasons: Secondary | ICD-10-CM

## 2014-11-03 DIAGNOSIS — E119 Type 2 diabetes mellitus without complications: Secondary | ICD-10-CM | POA: Insufficient documentation

## 2014-11-06 ENCOUNTER — Telehealth: Payer: Self-pay | Admitting: General Practice

## 2014-11-06 NOTE — Telephone Encounter (Signed)
Telephone call to patient to request AFP results as they were sent to [facility]. Called patient, no answer- left message stating we are trying to reach you in regards to requesting some results, please call us back at the clinics. Results should be sent to Dr Shawnie Pons

## 2014-11-06 NOTE — Telephone Encounter (Signed)
Patient called back to front office. Informed patient that Dr Shawnie Pons was requesting the results of the genetic screen or AFP blood test that was done in our office but sent to [facility]. Patient states she will get someone to fax that to our office or call us back. Patient had no questions

## 2014-11-07 ENCOUNTER — Other Ambulatory Visit: Payer: Self-pay | Admitting: Obstetrics and Gynecology

## 2014-11-07 DIAGNOSIS — IMO0002 Reserved for concepts with insufficient information to code with codable children: Secondary | ICD-10-CM

## 2014-11-07 DIAGNOSIS — D573 Sickle-cell trait: Secondary | ICD-10-CM

## 2014-11-07 DIAGNOSIS — O09522 Supervision of elderly multigravida, second trimester: Secondary | ICD-10-CM

## 2014-11-07 DIAGNOSIS — Z3A24 24 weeks gestation of pregnancy: Secondary | ICD-10-CM

## 2014-11-07 DIAGNOSIS — Z9884 Bariatric surgery status: Secondary | ICD-10-CM

## 2014-11-07 DIAGNOSIS — O24913 Unspecified diabetes mellitus in pregnancy, third trimester: Secondary | ICD-10-CM

## 2014-11-07 DIAGNOSIS — Z0489 Encounter for examination and observation for other specified reasons: Secondary | ICD-10-CM

## 2014-11-07 DIAGNOSIS — O34219 Maternal care for unspecified type scar from previous cesarean delivery: Secondary | ICD-10-CM

## 2014-11-16 LAB — AFP, QUAD SCREEN
DIA Mom Value: 0.71
DIA Value (EIA): 106.71 pg/mL
DSR (By Age)    1 IN: 114
DSR (Second Trimester) 1 IN: 711
Gestational Age: 17.7 WEEKS
MSAFP MOM: 1.02
MSAFP: 31 ng/mL
MSHCG MOM: 0.92
MSHCG: 21652 m[IU]/mL
Maternal Age At EDD: 39 YEARS
Osb Risk: 6968
T18 (By Age): 1:444 {titer}
Test Results:: NEGATIVE
UE3 MOM: 0.76
Weight: 205 [lb_av]
uE3 Value: 0.91 ng/mL

## 2014-11-16 LAB — SPECIMEN STATUS REPORT

## 2014-11-20 ENCOUNTER — Ambulatory Visit (INDEPENDENT_AMBULATORY_CARE_PROVIDER_SITE_OTHER): Payer: 59 | Admitting: Obstetrics and Gynecology

## 2014-11-20 VITALS — BP 124/69 | HR 89 | Temp 98.9°F | Wt 206.9 lb

## 2014-11-20 DIAGNOSIS — O24912 Unspecified diabetes mellitus in pregnancy, second trimester: Secondary | ICD-10-CM

## 2014-11-20 DIAGNOSIS — I10 Essential (primary) hypertension: Secondary | ICD-10-CM

## 2014-11-20 DIAGNOSIS — O0992 Supervision of high risk pregnancy, unspecified, second trimester: Secondary | ICD-10-CM

## 2014-11-20 LAB — POCT URINALYSIS DIP (DEVICE)
BILIRUBIN URINE: NEGATIVE
Glucose, UA: NEGATIVE mg/dL
Nitrite: NEGATIVE
PH: 6 (ref 5.0–8.0)
Protein, ur: NEGATIVE mg/dL
Specific Gravity, Urine: 1.015 (ref 1.005–1.030)
Urobilinogen, UA: 0.2 mg/dL (ref 0.0–1.0)

## 2014-11-20 NOTE — Progress Notes (Signed)
Mod leuks on UA Reviewed tip of week with patient  

## 2014-11-20 NOTE — Progress Notes (Signed)
Subjective:  Melissa Braun is a 39 y.o. G2P1001 at [redacted]w[redacted]d being seen today for ongoing prenatal care.  Patient reports no complaints.  Contractions: Not present.  Vag. Bleeding: None. Movement: Present. Denies leaking of fluid.   Only complaint is cough starting last week, now at end, wondering what she can take.   No sugar log today, coming back from vacation and is still unpacking.  AM; - this AM 72. No issues with sugars dropping too low or too high over past week. Nothing > 200, nothing < 70. Highest # is 138 - 140, lowest 70.   Fetal echo scheudled 7/13, eye exam july  The following portions of the patient's history were reviewed and updated as appropriate: allergies, current medications, past family history, past medical history, past social history, past surgical history and problem list.   Objective:   Filed Vitals:   11/20/14 0841  BP: 124/69  Pulse: 89  Temp: 98.9 F (37.2 C)  Weight: 206 lb 14.4 oz (93.849 kg)    Fetal Status: Fetal Heart Rate (bpm): 144 Fundal Height: 20 cm Movement: Present     General:  Alert, oriented and cooperative. Patient is in no acute distress.  Skin: Skin is warm and dry. No rash noted.   Cardiovascular: Normal heart rate noted  Respiratory: Normal respiratory effort, no problems with respiration noted  Abdomen: Soft, gravid, appropriate for gestational age. Pain/Pressure: Absent     Vaginal: Vag. Bleeding: None.       Cervix: Not evaluated        Extremities: Normal range of motion.  Edema: Trace  Mental Status: Normal mood and affect. Normal behavior. Normal judgment and thought content.   Urinalysis: Urine Protein: Negative Urine Glucose: Negative  Assessment and Plan:  Pregnancy: G2P1001 at [redacted]w[redacted]d  #) cHTN  - well cotrolled today  #)  GDM - did not bring sugar log, but reports all well controlled - RTC 2 wks for re-eval  #) Routine PNC - Discussed safe meds to take in pregnancy today Preterm labor symptoms and general  obstetric precautions including but not limited to vaginal bleeding, contractions, leaking of fluid and fetal movement were reviewed in detail with the patient.  Please refer to After Visit Summary for other counseling recommendations.   Return in about 2 weeks (around 12/04/2014) for hrob.   Ethelda Chick, MD

## 2014-12-04 ENCOUNTER — Encounter: Payer: 59 | Admitting: Family Medicine

## 2014-12-11 ENCOUNTER — Ambulatory Visit (INDEPENDENT_AMBULATORY_CARE_PROVIDER_SITE_OTHER): Payer: 59 | Admitting: Obstetrics and Gynecology

## 2014-12-11 ENCOUNTER — Encounter: Payer: Self-pay | Admitting: Obstetrics and Gynecology

## 2014-12-11 ENCOUNTER — Encounter: Payer: Self-pay | Admitting: *Deleted

## 2014-12-11 VITALS — BP 120/48 | HR 89 | Wt 204.8 lb

## 2014-12-11 DIAGNOSIS — O24912 Unspecified diabetes mellitus in pregnancy, second trimester: Secondary | ICD-10-CM

## 2014-12-11 DIAGNOSIS — O10019 Pre-existing essential hypertension complicating pregnancy, unspecified trimester: Secondary | ICD-10-CM

## 2014-12-11 DIAGNOSIS — I10 Essential (primary) hypertension: Secondary | ICD-10-CM

## 2014-12-11 DIAGNOSIS — O09292 Supervision of pregnancy with other poor reproductive or obstetric history, second trimester: Secondary | ICD-10-CM

## 2014-12-11 DIAGNOSIS — E103599 Type 1 diabetes mellitus with proliferative diabetic retinopathy without macular edema, unspecified eye: Secondary | ICD-10-CM

## 2014-12-11 DIAGNOSIS — O34219 Maternal care for unspecified type scar from previous cesarean delivery: Secondary | ICD-10-CM

## 2014-12-11 DIAGNOSIS — O0992 Supervision of high risk pregnancy, unspecified, second trimester: Secondary | ICD-10-CM

## 2014-12-11 DIAGNOSIS — O99840 Bariatric surgery status complicating pregnancy, unspecified trimester: Secondary | ICD-10-CM

## 2014-12-11 DIAGNOSIS — E10359 Type 1 diabetes mellitus with proliferative diabetic retinopathy without macular edema: Secondary | ICD-10-CM

## 2014-12-11 DIAGNOSIS — D573 Sickle-cell trait: Secondary | ICD-10-CM

## 2014-12-11 DIAGNOSIS — O09522 Supervision of elderly multigravida, second trimester: Secondary | ICD-10-CM

## 2014-12-11 DIAGNOSIS — O3421 Maternal care for scar from previous cesarean delivery: Secondary | ICD-10-CM

## 2014-12-11 LAB — POCT URINALYSIS DIP (DEVICE)
Bilirubin Urine: NEGATIVE
Glucose, UA: NEGATIVE mg/dL
HGB URINE DIPSTICK: NEGATIVE
Ketones, ur: NEGATIVE mg/dL
NITRITE: NEGATIVE
Protein, ur: NEGATIVE mg/dL
SPECIFIC GRAVITY, URINE: 1.025 (ref 1.005–1.030)
Urobilinogen, UA: 0.2 mg/dL (ref 0.0–1.0)
pH: 5.5 (ref 5.0–8.0)

## 2014-12-11 NOTE — Progress Notes (Signed)
Reviewed Breastfeeding tip of the week.

## 2014-12-11 NOTE — Progress Notes (Signed)
Subjective:  Melissa Braun is a 39 y.o. G2P1001 at 2984w5d being seen today for ongoing prenatal care.  Patient reports no complaints.  Contractions: Not present.  Vag. Bleeding: None. Movement: Present. Denies leaking of fluid.   The following portions of the patient's history were reviewed and updated as appropriate: allergies, current medications, past family history, past medical history, past social history, past surgical history and problem list.   Objective:   Filed Vitals:   12/11/14 0801  BP: 120/48  Pulse: 89  Weight: 204 lb 12.8 oz (92.897 kg)    Fetal Status: Fetal Heart Rate (bpm): 152 Fundal Height: 23 cm Movement: Present     General:  Alert, oriented and cooperative. Patient is in no acute distress.  Skin: Skin is warm and dry. No rash noted.   Cardiovascular: Normal heart rate noted  Respiratory: Normal respiratory effort, no problems with respiration noted  Abdomen: Soft, gravid, appropriate for gestational age. Pain/Pressure: Absent     Vaginal: Vag. Bleeding: None.       Cervix: Not evaluated        Extremities: Normal range of motion.  Edema: Trace  Mental Status: Normal mood and affect. Normal behavior. Normal judgment and thought content.   Urinalysis:      Assessment and Plan:  Pregnancy: G2P1001 at 1984w5d  1. Supervision of high risk pregnancy, antepartum, second trimester Patient is doing well. Follow up ultrasound on 7/22  2. Previous cesarean delivery affecting pregnancy Desires TOLAC, needs to sign consent  3. Previous gastric bypass affecting pregnancy, antepartum   4. Sickle cell trait   5. History of pre-eclampsia in prior pregnancy, currently pregnant, second trimester Continue daily ASA  6. Essential hypertension, benign Normotensive Continue monitoring  7. 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 Diabetes mellitus during pregnancy, antepartum, second trimester CBG  all within range Continue current insulin 70/30 regimen  9. Benign essential hypertension antepartum   10. AMA (advanced maternal age) multigravida 35+, second trimester   General obstetric precautions including but not limited to vaginal bleeding, contractions, leaking of fluid and fetal movement were reviewed in detail with the patient. Please refer to After Visit Summary for other counseling recommendations.  Return in about 3 weeks (around 01/01/2015).   Catalina AntiguaPeggy Kollin Udell, MD

## 2014-12-15 ENCOUNTER — Ambulatory Visit (HOSPITAL_COMMUNITY)
Admission: RE | Admit: 2014-12-15 | Discharge: 2014-12-15 | Disposition: A | Discharge status: Home / Self Care | Payer: 59 | Source: Ambulatory Visit | Attending: Obstetrics and Gynecology | Admitting: Obstetrics and Gynecology

## 2014-12-15 ENCOUNTER — Encounter (HOSPITAL_COMMUNITY): Payer: Self-pay

## 2014-12-15 DIAGNOSIS — O24912 Unspecified diabetes mellitus in pregnancy, second trimester: Secondary | ICD-10-CM | POA: Diagnosis not present

## 2014-12-15 DIAGNOSIS — O3421 Maternal care for scar from previous cesarean delivery: Secondary | ICD-10-CM | POA: Insufficient documentation

## 2014-12-15 DIAGNOSIS — Z9884 Bariatric surgery status: Secondary | ICD-10-CM

## 2014-12-15 DIAGNOSIS — O99012 Anemia complicating pregnancy, second trimester: Secondary | ICD-10-CM | POA: Insufficient documentation

## 2014-12-15 DIAGNOSIS — Z0489 Encounter for examination and observation for other specified reasons: Secondary | ICD-10-CM | POA: Insufficient documentation

## 2014-12-15 DIAGNOSIS — IMO0002 Reserved for concepts with insufficient information to code with codable children: Secondary | ICD-10-CM | POA: Insufficient documentation

## 2014-12-15 DIAGNOSIS — O09522 Supervision of elderly multigravida, second trimester: Secondary | ICD-10-CM | POA: Diagnosis not present

## 2014-12-15 DIAGNOSIS — O24913 Unspecified diabetes mellitus in pregnancy, third trimester: Secondary | ICD-10-CM

## 2014-12-15 DIAGNOSIS — O34219 Maternal care for unspecified type scar from previous cesarean delivery: Secondary | ICD-10-CM

## 2014-12-15 DIAGNOSIS — Z3A24 24 weeks gestation of pregnancy: Secondary | ICD-10-CM | POA: Diagnosis not present

## 2014-12-15 DIAGNOSIS — D573 Sickle-cell trait: Secondary | ICD-10-CM | POA: Diagnosis not present

## 2014-12-15 DIAGNOSIS — O99210 Obesity complicating pregnancy, unspecified trimester: Secondary | ICD-10-CM | POA: Insufficient documentation

## 2014-12-18 ENCOUNTER — Other Ambulatory Visit: Payer: Self-pay | Admitting: Family Medicine

## 2015-01-01 ENCOUNTER — Encounter: Payer: Self-pay | Admitting: Family Medicine

## 2015-01-01 ENCOUNTER — Ambulatory Visit (INDEPENDENT_AMBULATORY_CARE_PROVIDER_SITE_OTHER): Payer: 59 | Admitting: Family Medicine

## 2015-01-01 VITALS — BP 118/65 | HR 96 | Wt 206.5 lb

## 2015-01-01 DIAGNOSIS — O0992 Supervision of high risk pregnancy, unspecified, second trimester: Secondary | ICD-10-CM

## 2015-01-01 DIAGNOSIS — O3421 Maternal care for scar from previous cesarean delivery: Secondary | ICD-10-CM

## 2015-01-01 DIAGNOSIS — Z23 Encounter for immunization: Secondary | ICD-10-CM | POA: Diagnosis not present

## 2015-01-01 DIAGNOSIS — O10019 Pre-existing essential hypertension complicating pregnancy, unspecified trimester: Secondary | ICD-10-CM

## 2015-01-01 DIAGNOSIS — O34219 Maternal care for unspecified type scar from previous cesarean delivery: Secondary | ICD-10-CM

## 2015-01-01 DIAGNOSIS — O10012 Pre-existing essential hypertension complicating pregnancy, second trimester: Secondary | ICD-10-CM

## 2015-01-01 DIAGNOSIS — O24912 Unspecified diabetes mellitus in pregnancy, second trimester: Secondary | ICD-10-CM

## 2015-01-01 LAB — POCT URINALYSIS DIP (DEVICE)
Bilirubin Urine: NEGATIVE
GLUCOSE, UA: NEGATIVE mg/dL
Hgb urine dipstick: NEGATIVE
KETONES UR: NEGATIVE mg/dL
Nitrite: POSITIVE — AB
Protein, ur: NEGATIVE mg/dL
SPECIFIC GRAVITY, URINE: 1.02 (ref 1.005–1.030)
UROBILINOGEN UA: 1 mg/dL (ref 0.0–1.0)
pH: 6.5 (ref 5.0–8.0)

## 2015-01-01 MED ORDER — TETANUS-DIPHTH-ACELL PERTUSSIS 5-2.5-18.5 LF-MCG/0.5 IM SUSP
0.5000 mL | Freq: Once | INTRAMUSCULAR | Status: AC
  Administered 2015-01-01: 0.5 mL via INTRAMUSCULAR

## 2015-01-01 NOTE — Patient Instructions (Signed)
Breastfeeding Deciding to breastfeed is one of the best choices you can make for you and your baby. A change in hormones during pregnancy causes your breast tissue to grow and increases the number and size of your milk ducts. These hormones also allow proteins, sugars, and fats from your blood supply to make breast milk in your milk-producing glands. Hormones prevent breast milk from being released before your baby is born as well as prompt milk flow after birth. Once breastfeeding has begun, thoughts of your baby, as well as his or her sucking or crying, can stimulate the release of milk from your milk-producing glands.  BENEFITS OF BREASTFEEDING For Your Baby  Your first milk (colostrum) helps your baby's digestive system function better.   There are antibodies in your milk that help your baby fight off infections.   Your baby has a lower incidence of asthma, allergies, and sudden infant death syndrome.   The nutrients in breast milk are better for your baby than infant formulas and are designed uniquely for your baby's needs.   Breast milk improves your baby's brain development.   Your baby is less likely to develop other conditions, such as childhood obesity, asthma, or type 2 diabetes mellitus.  For You   Breastfeeding helps to create a very special bond between you and your baby.   Breastfeeding is convenient. Breast milk is always available at the correct temperature and costs nothing.   Breastfeeding helps to burn calories and helps you lose the weight gained during pregnancy.   Breastfeeding makes your uterus contract to its prepregnancy size faster and slows bleeding (lochia) after you give birth.   Breastfeeding helps to lower your risk of developing type 2 diabetes mellitus, osteoporosis, and breast or ovarian cancer later in life. SIGNS THAT YOUR BABY IS HUNGRY Early Signs of Hunger  Increased alertness or activity.  Stretching.  Movement of the head from  side to side.  Movement of the head and opening of the mouth when the corner of the mouth or cheek is stroked (rooting).  Increased sucking sounds, smacking lips, cooing, sighing, or squeaking.  Hand-to-mouth movements.  Increased sucking of fingers or hands. Late Signs of Hunger  Fussing.  Intermittent crying. Extreme Signs of Hunger Signs of extreme hunger will require calming and consoling before your baby will be able to breastfeed successfully. Do not wait for the following signs of extreme hunger to occur before you initiate breastfeeding:   Restlessness.  A loud, strong cry.   Screaming. BREASTFEEDING BASICS Breastfeeding Initiation  Find a comfortable place to sit or lie down, with your neck and back well supported.  Place a pillow or rolled up blanket under your baby to bring him or her to the level of your breast (if you are seated). Nursing pillows are specially designed to help support your arms and your baby while you breastfeed.  Make sure that your baby's abdomen is facing your abdomen.   Gently massage your breast. With your fingertips, massage from your chest wall toward your nipple in a circular motion. This encourages milk flow. You may need to continue this action during the feeding if your milk flows slowly.  Support your breast with 4 fingers underneath and your thumb above your nipple. Make sure your fingers are well away from your nipple and your baby's mouth.   Stroke your baby's lips gently with your finger or nipple.   When your baby's mouth is open wide enough, quickly bring your baby to your   breast, placing your entire nipple and as much of the colored area around your nipple (areola) as possible into your baby's mouth.   More areola should be visible above your baby's upper lip than below the lower lip.   Your baby's tongue should be between his or her lower gum and your breast.   Ensure that your baby's mouth is correctly positioned  around your nipple (latched). Your baby's lips should create a seal on your breast and be turned out (everted).  It is common for your baby to suck about 2-3 minutes in order to start the flow of breast milk. Latching Teaching your baby how to latch on to your breast properly is very important. An improper latch can cause nipple pain and decreased milk supply for you and poor weight gain in your baby. Also, if your baby is not latched onto your nipple properly, he or she may swallow some air during feeding. This can make your baby fussy. Burping your baby when you switch breasts during the feeding can help to get rid of the air. However, teaching your baby to latch on properly is still the best way to prevent fussiness from swallowing air while breastfeeding. Signs that your baby has successfully latched on to your nipple:    Silent tugging or silent sucking, without causing you pain.   Swallowing heard between every 3-4 sucks.    Muscle movement above and in front of his or her ears while sucking.  Signs that your baby has not successfully latched on to nipple:   Sucking sounds or smacking sounds from your baby while breastfeeding.  Nipple pain. If you think your baby has not latched on correctly, slip your finger into the corner of your baby's mouth to break the suction and place it between your baby's gums. Attempt breastfeeding initiation again. Signs of Successful Breastfeeding Signs from your baby:   A gradual decrease in the number of sucks or complete cessation of sucking.   Falling asleep.   Relaxation of his or her body.   Retention of a small amount of milk in his or her mouth.   Letting go of your breast by himself or herself. Signs from you:  Breasts that have increased in firmness, weight, and size 1-3 hours after feeding.   Breasts that are softer immediately after breastfeeding.  Increased milk volume, as well as a change in milk consistency and color by  the fifth day of breastfeeding.   Nipples that are not sore, cracked, or bleeding. Signs That Your Baby is Getting Enough Milk  Wetting at least 3 diapers in a 24-hour period. The urine should be clear and pale yellow by age 5 days.  At least 3 stools in a 24-hour period by age 5 days. The stool should be soft and yellow.  At least 3 stools in a 24-hour period by age 7 days. The stool should be seedy and yellow.  No loss of weight greater than 10% of birth weight during the first 3 days of age.  Average weight gain of 4-7 ounces (113-198 g) per week after age 4 days.  Consistent daily weight gain by age 5 days, without weight loss after the age of 2 weeks. After a feeding, your baby may spit up a small amount. This is common. BREASTFEEDING FREQUENCY AND DURATION Frequent feeding will help you make more milk and can prevent sore nipples and breast engorgement. Breastfeed when you feel the need to reduce the fullness of your breasts   or when your baby shows signs of hunger. This is called "breastfeeding on demand." Avoid introducing a pacifier to your baby while you are working to establish breastfeeding (the first 4-6 weeks after your baby is born). After this time you may choose to use a pacifier. Research has shown that pacifier use during the first year of a baby's life decreases the risk of sudden infant death syndrome (SIDS). Allow your baby to feed on each breast as long as he or she wants. Breastfeed until your baby is finished feeding. When your baby unlatches or falls asleep while feeding from the first breast, offer the second breast. Because newborns are often sleepy in the first few weeks of life, you may need to awaken your baby to get him or her to feed. Breastfeeding times will vary from baby to baby. However, the following rules can serve as a guide to help you ensure that your baby is properly fed:  Newborns (babies 4 weeks of age or younger) may breastfeed every 1-3  hours.  Newborns should not go longer than 3 hours during the day or 5 hours during the night without breastfeeding.  You should breastfeed your baby a minimum of 8 times in a 24-hour period until you begin to introduce solid foods to your baby at around 6 months of age. BREAST MILK PUMPING Pumping and storing breast milk allows you to ensure that your baby is exclusively fed your breast milk, even at times when you are unable to breastfeed. This is especially important if you are going back to work while you are still breastfeeding or when you are not able to be present during feedings. Your lactation consultant can give you guidelines on how long it is safe to store breast milk.  A breast pump is a machine that allows you to pump milk from your breast into a sterile bottle. The pumped breast milk can then be stored in a refrigerator or freezer. Some breast pumps are operated by hand, while others use electricity. Ask your lactation consultant which type will work best for you. Breast pumps can be purchased, but some hospitals and breastfeeding support groups lease breast pumps on a monthly basis. A lactation consultant can teach you how to hand express breast milk, if you prefer not to use a pump.  CARING FOR YOUR BREASTS WHILE YOU BREASTFEED Nipples can become dry, cracked, and sore while breastfeeding. The following recommendations can help keep your breasts moisturized and healthy:  Avoid using soap on your nipples.   Wear a supportive bra. Although not required, special nursing bras and tank tops are designed to allow access to your breasts for breastfeeding without taking off your entire bra or top. Avoid wearing underwire-style bras or extremely tight bras.  Air dry your nipples for 3-4minutes after each feeding.   Use only cotton bra pads to absorb leaked breast milk. Leaking of breast milk between feedings is normal.   Use lanolin on your nipples after breastfeeding. Lanolin helps to  maintain your skin's normal moisture barrier. If you use pure lanolin, you do not need to wash it off before feeding your baby again. Pure lanolin is not toxic to your baby. You may also hand express a few drops of breast milk and gently massage that milk into your nipples and allow the milk to air dry. In the first few weeks after giving birth, some women experience extremely full breasts (engorgement). Engorgement can make your breasts feel heavy, warm, and tender to the   touch. Engorgement peaks within 3-5 days after you give birth. The following recommendations can help ease engorgement:  Completely empty your breasts while breastfeeding or pumping. You may want to start by applying warm, moist heat (in the shower or with warm water-soaked hand towels) just before feeding or pumping. This increases circulation and helps the milk flow. If your baby does not completely empty your breasts while breastfeeding, pump any extra milk after he or she is finished.  Wear a snug bra (nursing or regular) or tank top for 1-2 days to signal your body to slightly decrease milk production.  Apply ice packs to your breasts, unless this is too uncomfortable for you.  Make sure that your baby is latched on and positioned properly while breastfeeding. If engorgement persists after 48 hours of following these recommendations, contact your health care provider or a Advertising copywriterlactation consultant. OVERALL HEALTH CARE RECOMMENDATIONS WHILE BREASTFEEDING  Eat healthy foods. Alternate between meals and snacks, eating 3 of each per day. Because what you eat affects your breast milk, some of the foods may make your baby more irritable than usual. Avoid eating these foods if you are sure that they are negatively affecting your baby.  Drink milk, fruit juice, and water to satisfy your thirst (about 10 glasses a day).   Rest often, relax, and continue to take your prenatal vitamins to prevent fatigue, stress, and anemia.  Continue  breast self-awareness checks.  Avoid chewing and smoking tobacco.  Avoid alcohol and drug use. Some medicines that may be harmful to your baby can pass through breast milk. It is important to ask your health care provider before taking any medicine, including all over-the-counter and prescription medicine as well as vitamin and herbal supplements. It is possible to become pregnant while breastfeeding. If birth control is desired, ask your health care provider about options that will be safe for your baby. SEEK MEDICAL CARE IF:   You feel like you want to stop breastfeeding or have become frustrated with breastfeeding.  You have painful breasts or nipples.  Your nipples are cracked or bleeding.  Your breasts are red, tender, or warm.  You have a swollen area on either breast.  You have a fever or chills.  You have nausea or vomiting.  You have drainage other than breast milk from your nipples.  Your breasts do not become full before feedings by the fifth day after you give birth.  You feel sad and depressed.  Your baby is too sleepy to eat well.  Your baby is having trouble sleeping.   Your baby is wetting less than 3 diapers in a 24-hour period.  Your baby has less than 3 stools in a 24-hour period.  Your baby's skin or the white part of his or her eyes becomes yellow.   Your baby is not gaining weight by 575 days of age. SEEK IMMEDIATE MEDICAL CARE IF:   Your baby is overly tired (lethargic) and does not want to wake up and feed.  Your baby develops an unexplained fever. Document Released: 05/12/2005 Document Revised: 05/17/2013 Document Reviewed: 11/03/2012 [facility] Patient Information 2015 [place], [place]. This information is not intended to replace advice given to you by your health care provider. Make sure you discuss any questions you have with your health care provider.  Trial of Labor After Cesarean Delivery Information A trial of labor after cesarean delivery  (TOLAC) is when a woman tries to give birth vaginally after a previous cesarean delivery. TOLAC may be a  safe and appropriate option for you depending on your medical history and other risk factors. When TOLAC is successful and you are able to have a vaginal delivery, this is called a vaginal birth after cesarean delivery (VBAC).  CANDIDATES FOR TOLAC TOLAC is possible for some women who:  Have undergone one or two prior cesarean deliveries in which the incision of the uterus was horizontal (low transverse).  Are carrying twins and have had one prior low transverse incision during a cesarean delivery.  Do not have a vertical (classical) uterine scar.  Have not had a tear in the wall of their uterus (uterine rupture). TOLAC is also supported for women who meet appropriate criteria and:  Are under the age of 40 years.  Are tall and have a body mass index (BMI) of less than 30.  Have an unknown uterine scar.  Give birth in a facility equipped to handle an emergency cesarean delivery. This team should be able to handle possible complications such as a uterine rupture.  Have thorough counseling about the benefits and risks of TOLAC.  Have discussed future pregnancy plans with their health care provider.  Plan to have several more pregnancies. MOST SUCCESSFUL CANDIDATES FOR TOLAC:  Have had a successful vaginal delivery before or after their cesarean delivery.  Experience labor that begins naturally on or before the due date (40 weeks of gestation).  Do not have a very large (macrosomic) baby.   Had a prior cesarean delivery but are not currently experiencing factors that would prompt a cesarean delivery (such as a breech position).  Had only one prior cesarean delivery.  Had a prior cesarean delivery that was performed early in labor and not after full cervical dilation. TOLAC may be most appropriate for women who meet the above guidelines and who plan to have more pregnancies.  TOLAC is not recommended for home births. LEAST SUCCESSFUL CANDIDATES FOR TOLAC:  Have an induced labor with an unfavorable cervix. An unfavorable cervix is when the cervix is not dilating enough (among other factors).  Have never had a vaginal delivery.  Have had more than two cesarean deliveries.  Have a pregnancy at more than 40 weeks of gestation.  Are pregnant with a baby with a suspected weight greater than 4,000 grams (8 pounds) and who have no prior history of a vaginal delivery.  Have closely spaced pregnancies. SUGGESTED BENEFITS OF TOLAC  You may have a faster recovery time.  You may have a shorter stay in the hospital.  You may have less pain and fewer problems than with a cesarean delivery. Women who have a cesarean delivery have a higher chance of needing blood or getting a fever, an infection, or a blood clot in the legs. SUGGESTED RISKS OF TOLAC The highest risk of complications happens to women who attempt a TOLAC and fail. A failed TOLAC results in an unplanned cesarean delivery. Risks related to TOLAC or repeat cesarean deliveries include:   Blood loss.  Infection.  Blood clot.  Injury to surrounding tissues or organs.  Having to remove the uterus (hysterectomy).  Potential problems with the placenta (such as placenta previa or placenta accreta) in future pregnancies. Although very rare, the main concerns with TOLAC are:  Rupture of the uterine scar from a past cesarean delivery.  Needing an emergency cesarean delivery.  Having a bad outcome for the baby (perinatal morbidity). FOR MORE INFORMATION American Congress of Obstetricians and Gynecologists: www.acog.org American College of Nurse-Midwives: www.midwife.org Document Released: 01/28/2011 Document Revised:   03/02/2013 Document Reviewed: 11/01/2012 ExitCare Patient Information 2015 [place]. This information is not intended to replace advice given to you by your health care provider. Make  sure you discuss any questions you have with your health care provider.

## 2015-01-01 NOTE — Progress Notes (Signed)
Pt reports swelling in her feet. She needs a note for work stating that she can wear sandals to work due to her swelling.  Breastfeeding tip of the week reviewed with patient.  Positive nitrites and leukocytes in urine.

## 2015-01-01 NOTE — Progress Notes (Signed)
Subjective:  Melissa Braun is a 39 y.o. G2P1001 at [redacted]w[redacted]d being seen today for ongoing prenatal care.  Patient reports no complaints.  Contractions: Not present.  Vag. Bleeding: None. Movement: Present. Denies leaking of fluid.   The following portions of the patient's history were reviewed and updated as appropriate: allergies, current medications, past family history, past medical history, past social history, past surgical history and problem list.   Objective:   Filed Vitals:   01/01/15 0800  BP: 118/65  Pulse: 96  Weight: 206 lb 8 oz (93.668 kg)    Fetal Status: Fetal Heart Rate (bpm): 142   Movement: Present     General:  Alert, oriented and cooperative. Patient is in no acute distress.  Skin: Skin is warm and dry. No rash noted.   Cardiovascular: Normal heart rate noted  Respiratory: Normal respiratory effort, no problems with respiration noted  Abdomen: Soft, gravid, appropriate for gestational age. Pain/Pressure: Absent     Pelvic: Vag. Bleeding: None     Cervical exam deferred        Extremities: Normal range of motion.  Edema: Mild pitting, slight indentation  Mental Status: Normal mood and affect. Normal behavior. Normal judgment and thought content.   Urinalysis: Urine Protein: Negative Urine Glucose: Negative FBS 61-81 2 hour pp 77-120 Assessment and Plan:  Pregnancy: G2P1001 at [redacted]w[redacted]d  1. Supervision of high risk pregnancy, antepartum, second trimester Continue routine prenatal care.  - Tdap (BOOSTRIX) injection 0.5 mL; Inject 0.5 mLs into the muscle once. - RPR - CBC - HIV antibody  2. Diabetes mellitus during pregnancy, antepartum, second trimester Bs with excellent control at present. - US OB Follow Up; Future  3. Benign essential hypertension antepartum BP is normal  4. Previous cesarean delivery affecting pregnancy Has elected for RCS and BTL.  Preterm labor symptoms and general obstetric precautions including but not limited to vaginal bleeding,  contractions, leaking of fluid and fetal movement were reviewed in detail with the patient. Please refer to After Visit Summary for other counseling recommendations.  Return in 2 weeks (on 01/15/2015).   Reva Bores, MD

## 2015-01-02 ENCOUNTER — Other Ambulatory Visit: Payer: Self-pay | Admitting: Family Medicine

## 2015-01-02 DIAGNOSIS — Z3A28 28 weeks gestation of pregnancy: Secondary | ICD-10-CM

## 2015-01-02 DIAGNOSIS — O24912 Unspecified diabetes mellitus in pregnancy, second trimester: Secondary | ICD-10-CM

## 2015-01-02 LAB — CBC
HEMOGLOBIN: 12 g/dL (ref 11.1–15.9)
Hematocrit: 35.1 % (ref 34.0–46.6)
MCH: 29.6 pg (ref 26.6–33.0)
MCHC: 34.2 g/dL (ref 31.5–35.7)
MCV: 87 fL (ref 79–97)
Platelets: 153 10*3/uL (ref 150–379)
RBC: 4.05 x10E6/uL (ref 3.77–5.28)
RDW: 14.3 % (ref 12.3–15.4)
WBC: 10.4 10*3/uL (ref 3.4–10.8)

## 2015-01-02 LAB — RPR: RPR: NONREACTIVE

## 2015-01-02 LAB — HIV ANTIBODY (ROUTINE TESTING W REFLEX): HIV SCREEN 4TH GENERATION: NONREACTIVE

## 2015-01-03 ENCOUNTER — Telehealth: Payer: Self-pay | Admitting: *Deleted

## 2015-01-03 NOTE — Telephone Encounter (Signed)
Attempted to contact patient, no answer, left message for patient that she has completed FMLA paperwork at the clinic that needs a signed ROI in order to be faxed.  Note place on appointment note.

## 2015-01-12 ENCOUNTER — Ambulatory Visit (HOSPITAL_COMMUNITY)
Admission: RE | Admit: 2015-01-12 | Discharge: 2015-01-12 | Disposition: A | Discharge status: Home / Self Care | Payer: 59 | Source: Ambulatory Visit | Attending: Family Medicine | Admitting: Family Medicine

## 2015-01-12 ENCOUNTER — Encounter (HOSPITAL_COMMUNITY): Payer: Self-pay

## 2015-01-12 DIAGNOSIS — O24312 Unspecified pre-existing diabetes mellitus in pregnancy, second trimester: Secondary | ICD-10-CM | POA: Diagnosis not present

## 2015-01-12 DIAGNOSIS — O99212 Obesity complicating pregnancy, second trimester: Secondary | ICD-10-CM | POA: Insufficient documentation

## 2015-01-12 DIAGNOSIS — O3421 Maternal care for scar from previous cesarean delivery: Secondary | ICD-10-CM | POA: Insufficient documentation

## 2015-01-12 DIAGNOSIS — Z36 Encounter for antenatal screening of mother: Secondary | ICD-10-CM | POA: Diagnosis present

## 2015-01-12 DIAGNOSIS — O352XX0 Maternal care for (suspected) hereditary disease in fetus, not applicable or unspecified: Secondary | ICD-10-CM | POA: Diagnosis not present

## 2015-01-12 DIAGNOSIS — O09522 Supervision of elderly multigravida, second trimester: Secondary | ICD-10-CM

## 2015-01-12 DIAGNOSIS — Z3A28 28 weeks gestation of pregnancy: Secondary | ICD-10-CM | POA: Diagnosis not present

## 2015-01-12 DIAGNOSIS — O24912 Unspecified diabetes mellitus in pregnancy, second trimester: Secondary | ICD-10-CM

## 2015-01-12 DIAGNOSIS — O09521 Supervision of elderly multigravida, first trimester: Secondary | ICD-10-CM | POA: Insufficient documentation

## 2015-01-15 ENCOUNTER — Ambulatory Visit (INDEPENDENT_AMBULATORY_CARE_PROVIDER_SITE_OTHER): Payer: 59 | Admitting: Obstetrics & Gynecology

## 2015-01-15 VITALS — BP 125/63 | HR 90 | Temp 98.6°F | Wt 208.6 lb

## 2015-01-15 DIAGNOSIS — O10019 Pre-existing essential hypertension complicating pregnancy, unspecified trimester: Secondary | ICD-10-CM

## 2015-01-15 LAB — POCT URINALYSIS DIP (DEVICE)
BILIRUBIN URINE: NEGATIVE
Glucose, UA: NEGATIVE mg/dL
Hgb urine dipstick: NEGATIVE
Ketones, ur: NEGATIVE mg/dL
Nitrite: NEGATIVE
Protein, ur: NEGATIVE mg/dL
SPECIFIC GRAVITY, URINE: 1.015 (ref 1.005–1.030)
Urobilinogen, UA: 2 mg/dL — ABNORMAL HIGH (ref 0.0–1.0)
pH: 6.5 (ref 5.0–8.0)

## 2015-01-15 NOTE — Progress Notes (Signed)
Edema- feet/ankles

## 2015-01-15 NOTE — Patient Instructions (Signed)
Third Trimester of Pregnancy The third trimester is from week 29 through week 42, months 7 through 9. The third trimester is a time when the fetus is growing rapidly. At the end of the ninth month, the fetus is about 20 inches in length and weighs 6-10 pounds.  BODY CHANGES Your body goes through many changes during pregnancy. The changes vary from woman to woman.   Your weight will continue to increase. You can expect to gain 25-35 pounds (11-16 kg) by the end of the pregnancy.  You may begin to get stretch marks on your hips, abdomen, and breasts.  You may urinate more often because the fetus is moving lower into your pelvis and pressing on your bladder.  You may develop or continue to have heartburn as a result of your pregnancy.  You may develop constipation because certain hormones are causing the muscles that push waste through your intestines to slow down.  You may develop hemorrhoids or swollen, bulging veins (varicose veins).  You may have pelvic pain because of the weight gain and pregnancy hormones relaxing your joints between the bones in your pelvis. Backaches may result from overexertion of the muscles supporting your posture.  You may have changes in your hair. These can include thickening of your hair, rapid growth, and changes in texture. Some women also have hair loss during or after pregnancy, or hair that feels dry or thin. Your hair will most likely return to normal after your baby is born.  Your breasts will continue to grow and be tender. A yellow discharge may leak from your breasts called colostrum.  Your belly button may stick out.  You may feel short of breath because of your expanding uterus.  You may notice the fetus "dropping," or moving lower in your abdomen.  You may have a bloody mucus discharge. This usually occurs a few days to a week before labor begins.  Your cervix becomes thin and soft (effaced) near your due date. WHAT TO EXPECT AT YOUR PRENATAL  EXAMS  You will have prenatal exams every 2 weeks until week 36. Then, you will have weekly prenatal exams. During a routine prenatal visit:  You will be weighed to make sure you and the fetus are growing normally.  Your blood pressure is taken.  Your abdomen will be measured to track your baby's growth.  The fetal heartbeat will be listened to.  Any test results from the previous visit will be discussed.  You may have a cervical check near your due date to see if you have effaced. At around 36 weeks, your caregiver will check your cervix. At the same time, your caregiver will also perform a test on the secretions of the vaginal tissue. This test is to determine if a type of bacteria, Group B streptococcus, is present. Your caregiver will explain this further. Your caregiver may ask you:  What your birth plan is.  How you are feeling.  If you are feeling the baby move.  If you have had any abnormal symptoms, such as leaking fluid, bleeding, severe headaches, or abdominal cramping.  If you have any questions. Other tests or screenings that may be performed during your third trimester include:  Blood tests that check for low iron levels (anemia).  Fetal testing to check the health, activity level, and growth of the fetus. Testing is done if you have certain medical conditions or if there are problems during the pregnancy. FALSE LABOR You may feel small, irregular contractions that   eventually go away. These are called Braxton Hicks contractions, or false labor. Contractions may last for hours, days, or even weeks before true labor sets in. If contractions come at regular intervals, intensify, or become painful, it is best to be seen by your caregiver.  SIGNS OF LABOR   Menstrual-like cramps.  Contractions that are 5 minutes apart or less.  Contractions that start on the top of the uterus and spread down to the lower abdomen and back.  A sense of increased pelvic pressure or back  pain.  A watery or bloody mucus discharge that comes from the vagina. If you have any of these signs before the 37th week of pregnancy, call your caregiver right away. You need to go to the hospital to get checked immediately. HOME CARE INSTRUCTIONS   Avoid all smoking, herbs, alcohol, and unprescribed drugs. These chemicals affect the formation and growth of the baby.  Follow your caregiver's instructions regarding medicine use. There are medicines that are either safe or unsafe to take during pregnancy.  Exercise only as directed by your caregiver. Experiencing uterine cramps is a good sign to stop exercising.  Continue to eat regular, healthy meals.  Wear a good support bra for breast tenderness.  Do not use hot tubs, steam rooms, or saunas.  Wear your seat belt at all times when driving.  Avoid raw meat, uncooked cheese, cat litter boxes, and soil used by cats. These carry germs that can cause birth defects in the baby.  Take your prenatal vitamins.  Try taking a stool softener (if your caregiver approves) if you develop constipation. Eat more high-fiber foods, such as fresh vegetables or fruit and whole grains. Drink plenty of fluids to keep your urine clear or pale yellow.  Take warm sitz baths to soothe any pain or discomfort caused by hemorrhoids. Use hemorrhoid cream if your caregiver approves.  If you develop varicose veins, wear support hose. Elevate your feet for 15 minutes, 3-4 times a day. Limit salt in your diet.  Avoid heavy lifting, wear low heal shoes, and practice good posture.  Rest a lot with your legs elevated if you have leg cramps or low back pain.  Visit your dentist if you have not gone during your pregnancy. Use a soft toothbrush to brush your teeth and be gentle when you floss.  A sexual relationship may be continued unless your caregiver directs you otherwise.  Do not travel far distances unless it is absolutely necessary and only with the approval  of your caregiver.  Take prenatal classes to understand, practice, and ask questions about the labor and delivery.  Make a trial run to the hospital.  Pack your hospital bag.  Prepare the baby's nursery.  Continue to go to all your prenatal visits as directed by your caregiver. SEEK MEDICAL CARE IF:  You are unsure if you are in labor or if your water has broken.  You have dizziness.  You have mild pelvic cramps, pelvic pressure, or nagging pain in your abdominal area.  You have persistent nausea, vomiting, or diarrhea.  You have a bad smelling vaginal discharge.  You have pain with urination. SEEK IMMEDIATE MEDICAL CARE IF:   You have a fever.  You are leaking fluid from your vagina.  You have spotting or bleeding from your vagina.  You have severe abdominal cramping or pain.  You have rapid weight loss or gain.  You have shortness of breath with chest pain.  You notice sudden or extreme swelling   of your face, hands, ankles, feet, or legs.  You have not felt your baby move in over an hour.  You have severe headaches that do not go away with medicine.  You have vision changes. Document Released: 05/06/2001 Document Revised: 05/17/2013 Document Reviewed: 07/13/2012 Exit[employer] Patient Information 2015 [employer]. This information is not intended to replace advice given to you by your health care provider. Make sure you discuss any questions you have with your health care provider.  

## 2015-01-15 NOTE — Progress Notes (Signed)
[place] nl growth Subjective:good control noted  Melissa Braun is a 39 y.o. G2P1001 at [redacted]w[redacted]d being seen today for ongoing prenatal care.  Patient reports no complaints.  Contractions: Not present.  Vag. Bleeding: None. Movement: Present. Denies leaking of fluid.   The following portions of the patient's history were reviewed and updated as appropriate: allergies, current medications, past family history, past medical history, past social history, past surgical history and problem list.   Objective:   Filed Vitals:   01/15/15 0815  BP: 125/63  Pulse: 90  Temp: 98.6 F (37 C)  Weight: 208 lb 9.6 oz (94.62 kg)    Fetal Status:     Movement: Present     General:  Alert, oriented and cooperative. Patient is in no acute distress.  Skin: Skin is warm and dry. No rash noted.   Cardiovascular: Normal heart rate noted  Respiratory: Normal respiratory effort, no problems with respiration noted  Abdomen: Soft, gravid, appropriate for gestational age. Pain/Pressure: Absent     Pelvic: Vag. Bleeding: None     Cervical exam deferred        Extremities: Normal range of motion.  Edema: Mild pitting, slight indentation  Mental Status: Normal mood and affect. Normal behavior. Normal judgment and thought content.  CBG nl fasting and PP <120 Urinalysis:    negative  Assessment and Plan:  Pregnancy: G2P1001 at [redacted]w[redacted]d  1. Benign essential hypertension antepartum No medication  Preterm labor symptoms and general obstetric precautions including but not limited to vaginal bleeding, contractions, leaking of fluid and fetal movement were reviewed in detail with the patient. Please refer to After Visit Summary for other counseling recommendations.  2 weeks  Discussed compression hose Adam Phenix, MD

## 2015-01-22 ENCOUNTER — Other Ambulatory Visit (INDEPENDENT_AMBULATORY_CARE_PROVIDER_SITE_OTHER): Payer: Self-pay | Admitting: Internal Medicine

## 2015-01-26 ENCOUNTER — Ambulatory Visit (HOSPITAL_COMMUNITY): Payer: 59

## 2015-02-05 ENCOUNTER — Encounter: Payer: Self-pay | Admitting: Obstetrics and Gynecology

## 2015-02-05 ENCOUNTER — Ambulatory Visit (INDEPENDENT_AMBULATORY_CARE_PROVIDER_SITE_OTHER): Payer: 59 | Admitting: Obstetrics and Gynecology

## 2015-02-05 VITALS — BP 138/64 | HR 89 | Temp 98.7°F | Wt 217.2 lb

## 2015-02-05 DIAGNOSIS — E669 Obesity, unspecified: Secondary | ICD-10-CM

## 2015-02-05 DIAGNOSIS — O0993 Supervision of high risk pregnancy, unspecified, third trimester: Secondary | ICD-10-CM

## 2015-02-05 DIAGNOSIS — O3421 Maternal care for scar from previous cesarean delivery: Secondary | ICD-10-CM

## 2015-02-05 DIAGNOSIS — O99840 Bariatric surgery status complicating pregnancy, unspecified trimester: Secondary | ICD-10-CM

## 2015-02-05 DIAGNOSIS — O24913 Unspecified diabetes mellitus in pregnancy, third trimester: Secondary | ICD-10-CM

## 2015-02-05 DIAGNOSIS — O99213 Obesity complicating pregnancy, third trimester: Secondary | ICD-10-CM

## 2015-02-05 DIAGNOSIS — O99843 Bariatric surgery status complicating pregnancy, third trimester: Secondary | ICD-10-CM

## 2015-02-05 DIAGNOSIS — O09293 Supervision of pregnancy with other poor reproductive or obstetric history, third trimester: Secondary | ICD-10-CM

## 2015-02-05 DIAGNOSIS — O09523 Supervision of elderly multigravida, third trimester: Secondary | ICD-10-CM

## 2015-02-05 DIAGNOSIS — O34219 Maternal care for unspecified type scar from previous cesarean delivery: Secondary | ICD-10-CM

## 2015-02-05 DIAGNOSIS — I10 Essential (primary) hypertension: Secondary | ICD-10-CM

## 2015-02-05 LAB — POCT URINALYSIS DIP (DEVICE)
BILIRUBIN URINE: NEGATIVE
Bilirubin Urine: NEGATIVE
GLUCOSE, UA: NEGATIVE mg/dL
GLUCOSE, UA: NEGATIVE mg/dL
Hgb urine dipstick: NEGATIVE
KETONES UR: NEGATIVE mg/dL
KETONES UR: NEGATIVE mg/dL
Nitrite: NEGATIVE
Nitrite: NEGATIVE
PROTEIN: NEGATIVE mg/dL
Protein, ur: NEGATIVE mg/dL
Specific Gravity, Urine: 1.015 (ref 1.005–1.030)
Specific Gravity, Urine: 1.015 (ref 1.005–1.030)
UROBILINOGEN UA: 2 mg/dL — AB (ref 0.0–1.0)
Urobilinogen, UA: 2 mg/dL — ABNORMAL HIGH (ref 0.0–1.0)
pH: 6 (ref 5.0–8.0)
pH: 6 (ref 5.0–8.0)

## 2015-02-05 NOTE — Progress Notes (Signed)
Subjective:  Melissa Braun is a 39 y.o. G2P1001 at [redacted]w[redacted]d being seen today for ongoing prenatal care.  Patient reports no complaints.  Contractions: Not present.  Vag. Bleeding: None. Movement: Present. Denies leaking of fluid.   The following portions of the patient's history were reviewed and updated as appropriate: allergies, current medications, past family history, past medical history, past social history, past surgical history and problem list.   Objective:   Filed Vitals:   02/05/15 0812 02/05/15 0827  BP: 143/72 138/64  Pulse: 89   Temp: 98.7 F (37.1 C)   Weight: 217 lb 3.2 oz (98.521 kg)     Fetal Status: Fetal Heart Rate (bpm): 157   Movement: Present     General:  Alert, oriented and cooperative. Patient is in no acute distress.  Skin: Skin is warm and dry. No rash noted.   Cardiovascular: Normal heart rate noted  Respiratory: Normal respiratory effort, no problems with respiration noted  Abdomen: Soft, gravid, appropriate for gestational age. Pain/Pressure: Absent     Pelvic: Vag. Bleeding: None     Cervical exam deferred        Extremities: Normal range of motion.  Edema: Moderate pitting, indentation subsides rapidly  Mental Status: Normal mood and affect. Normal behavior. Normal judgment and thought content.   Urinalysis: Urine Protein: Negative Urine Glucose: Negative  Assessment and Plan:  Pregnancy: G2P1001 at [redacted]w[redacted]d  1. Supervision of high risk pregnancy, antepartum, third trimester Patient is doing well without complaints  2. Previous gastric bypass affecting pregnancy, antepartum   3. Previous cesarean delivery affecting pregnancy Will schedule repeat cesarean section with BTL at 39 weeks as per patient's request  4. Obesity affecting pregnancy, antepartum, third trimester   5. Obesity   6. History of pre-eclampsia in prior pregnancy, currently pregnant, third trimester   7. Essential hypertension, benign BP well controlled  8. Diabetes  mellitus during pregnancy, antepartum, third trimester CBGs all within range. Continue current regimen  9. AMA (advanced maternal age) multigravida 35+, third trimester   Preterm labor symptoms and general obstetric precautions including but not limited to vaginal bleeding, contractions, leaking of fluid and fetal movement were reviewed in detail with the patient. Please refer to After Visit Summary for other counseling recommendations.  Return in about 1 week (around 02/12/2015).   Catalina Antigua, MD

## 2015-02-08 ENCOUNTER — Encounter (HOSPITAL_COMMUNITY): Payer: Self-pay | Admitting: *Deleted

## 2015-02-09 ENCOUNTER — Other Ambulatory Visit (HOSPITAL_COMMUNITY): Payer: Self-pay | Admitting: Maternal and Fetal Medicine

## 2015-02-09 ENCOUNTER — Ambulatory Visit (HOSPITAL_COMMUNITY)
Admission: RE | Admit: 2015-02-09 | Discharge: 2015-02-09 | Disposition: A | Discharge status: Home / Self Care | Payer: 59 | Source: Ambulatory Visit | Attending: Obstetrics & Gynecology | Admitting: Obstetrics & Gynecology

## 2015-02-09 ENCOUNTER — Encounter (HOSPITAL_COMMUNITY): Payer: Self-pay

## 2015-02-09 VITALS — BP 143/77 | HR 85 | Wt 219.0 lb

## 2015-02-09 DIAGNOSIS — O24312 Unspecified pre-existing diabetes mellitus in pregnancy, second trimester: Secondary | ICD-10-CM

## 2015-02-09 DIAGNOSIS — E669 Obesity, unspecified: Secondary | ICD-10-CM | POA: Insufficient documentation

## 2015-02-09 DIAGNOSIS — O09522 Supervision of elderly multigravida, second trimester: Secondary | ICD-10-CM

## 2015-02-09 DIAGNOSIS — O09293 Supervision of pregnancy with other poor reproductive or obstetric history, third trimester: Secondary | ICD-10-CM

## 2015-02-09 DIAGNOSIS — O34219 Maternal care for unspecified type scar from previous cesarean delivery: Secondary | ICD-10-CM

## 2015-02-09 DIAGNOSIS — O2690 Pregnancy related conditions, unspecified, unspecified trimester: Secondary | ICD-10-CM

## 2015-02-09 DIAGNOSIS — O09523 Supervision of elderly multigravida, third trimester: Secondary | ICD-10-CM

## 2015-02-09 DIAGNOSIS — O352XX0 Maternal care for (suspected) hereditary disease in fetus, not applicable or unspecified: Secondary | ICD-10-CM | POA: Insufficient documentation

## 2015-02-09 DIAGNOSIS — O0993 Supervision of high risk pregnancy, unspecified, third trimester: Secondary | ICD-10-CM

## 2015-02-09 DIAGNOSIS — O99213 Obesity complicating pregnancy, third trimester: Secondary | ICD-10-CM

## 2015-02-09 DIAGNOSIS — O3421 Maternal care for scar from previous cesarean delivery: Secondary | ICD-10-CM | POA: Insufficient documentation

## 2015-02-12 ENCOUNTER — Ambulatory Visit (INDEPENDENT_AMBULATORY_CARE_PROVIDER_SITE_OTHER): Payer: 59 | Admitting: Obstetrics & Gynecology

## 2015-02-12 VITALS — BP 136/76 | HR 89 | Wt 220.6 lb

## 2015-02-12 DIAGNOSIS — O10019 Pre-existing essential hypertension complicating pregnancy, unspecified trimester: Secondary | ICD-10-CM | POA: Diagnosis not present

## 2015-02-12 DIAGNOSIS — O24913 Unspecified diabetes mellitus in pregnancy, third trimester: Secondary | ICD-10-CM

## 2015-02-12 DIAGNOSIS — O09523 Supervision of elderly multigravida, third trimester: Secondary | ICD-10-CM

## 2015-02-12 LAB — POCT URINALYSIS DIP (DEVICE)
Bilirubin Urine: NEGATIVE
Glucose, UA: NEGATIVE mg/dL
HGB URINE DIPSTICK: NEGATIVE
Ketones, ur: NEGATIVE mg/dL
NITRITE: POSITIVE — AB
Protein, ur: NEGATIVE mg/dL
Specific Gravity, Urine: 1.015 (ref 1.005–1.030)
UROBILINOGEN UA: 1 mg/dL (ref 0.0–1.0)
pH: 5.5 (ref 5.0–8.0)

## 2015-02-12 NOTE — Progress Notes (Signed)
Subjective:  Melissa Braun is a 39 y.o. G2P1001 at [redacted]w[redacted]d being seen today for ongoing prenatal care.  Patient reports BLE edema.  Contractions: Not present.  Vag. Bleeding: None. Movement: Present. Denies leaking of fluid.   The following portions of the patient's history were reviewed and updated as appropriate: allergies, current medications, past family history, past medical history, past social history, past surgical history and problem list.   Objective:   Filed Vitals:   02/12/15 0852  BP: 136/76  Pulse: 89  Weight: 220 lb 9.6 oz (100.064 kg)    Fetal Status: Fetal Heart Rate (bpm): RNST Fundal Height: 36 cm Movement: Present     General:  Alert, oriented and cooperative. Patient is in no acute distress.  Skin: Skin is warm and dry. No rash noted.   Cardiovascular: Normal heart rate noted  Respiratory: Normal respiratory effort, no problems with respiration noted  Abdomen: Soft, gravid, appropriate for gestational age. Pain/Pressure: Absent     Pelvic: Vag. Bleeding: None     Cervical exam deferred        Extremities: Normal range of motion.  Edema: Moderate pitting, indentation subsides rapidly.  Mental Status: Normal mood and affect. Normal behavior. Normal judgment and thought content.   Urinalysis: Urine Protein: Negative Urine Glucose: Negative NST performed today was reviewed and was found to be reactive.  Continue recommended antenatal testing and prenatal care. CBGs within range  Assessment and Plan:  Pregnancy: G2P1001 at [redacted]w[redacted]d  1. Benign essential hypertension antepartum Stable BP  2. Diabetes mellitus during pregnancy, antepartum, third trimester Continue insulin  Preterm labor symptoms and general obstetric precautions including but not limited to vaginal bleeding, contractions, leaking of fluid and fetal movement were reviewed in detail with the patient. Please refer to After Visit Summary for other counseling recommendations.  Return in about 7 days  (around 02/19/2015) for Ob fu and NST @ 0800 (overbook).   Tereso Newcomer, MD

## 2015-02-12 NOTE — Patient Instructions (Signed)
Return to clinic for any obstetric concerns or go to MAU for evaluation  

## 2015-02-15 ENCOUNTER — Ambulatory Visit (INDEPENDENT_AMBULATORY_CARE_PROVIDER_SITE_OTHER): Payer: 59 | Admitting: *Deleted

## 2015-02-15 VITALS — BP 145/80 | HR 90

## 2015-02-15 DIAGNOSIS — E119 Type 2 diabetes mellitus without complications: Secondary | ICD-10-CM | POA: Diagnosis not present

## 2015-02-15 DIAGNOSIS — O24913 Unspecified diabetes mellitus in pregnancy, third trimester: Secondary | ICD-10-CM

## 2015-02-15 NOTE — Progress Notes (Signed)
NST with AFI

## 2015-02-16 NOTE — Progress Notes (Signed)
NST reviewed and reactive.  Carolyn L. Harraway-Smith, M.D., FACOG    

## 2015-02-19 ENCOUNTER — Ambulatory Visit (INDEPENDENT_AMBULATORY_CARE_PROVIDER_SITE_OTHER): Payer: 59 | Admitting: Family Medicine

## 2015-02-19 VITALS — BP 133/69 | HR 80 | Wt 224.8 lb

## 2015-02-19 DIAGNOSIS — O99840 Bariatric surgery status complicating pregnancy, unspecified trimester: Secondary | ICD-10-CM

## 2015-02-19 DIAGNOSIS — D509 Iron deficiency anemia, unspecified: Secondary | ICD-10-CM

## 2015-02-19 DIAGNOSIS — O0993 Supervision of high risk pregnancy, unspecified, third trimester: Secondary | ICD-10-CM

## 2015-02-19 DIAGNOSIS — O10019 Pre-existing essential hypertension complicating pregnancy, unspecified trimester: Secondary | ICD-10-CM | POA: Diagnosis not present

## 2015-02-19 DIAGNOSIS — O09523 Supervision of elderly multigravida, third trimester: Secondary | ICD-10-CM

## 2015-02-19 DIAGNOSIS — O3421 Maternal care for scar from previous cesarean delivery: Secondary | ICD-10-CM

## 2015-02-19 DIAGNOSIS — O24913 Unspecified diabetes mellitus in pregnancy, third trimester: Secondary | ICD-10-CM | POA: Diagnosis not present

## 2015-02-19 DIAGNOSIS — O34219 Maternal care for unspecified type scar from previous cesarean delivery: Secondary | ICD-10-CM

## 2015-02-19 DIAGNOSIS — O99843 Bariatric surgery status complicating pregnancy, third trimester: Secondary | ICD-10-CM

## 2015-02-19 LAB — POCT URINALYSIS DIP (DEVICE)
Bilirubin Urine: NEGATIVE
Glucose, UA: NEGATIVE mg/dL
Hgb urine dipstick: NEGATIVE
Ketones, ur: NEGATIVE mg/dL
NITRITE: NEGATIVE
Protein, ur: NEGATIVE mg/dL
Specific Gravity, Urine: 1.02 (ref 1.005–1.030)
UROBILINOGEN UA: 1 mg/dL (ref 0.0–1.0)
pH: 5.5 (ref 5.0–8.0)

## 2015-02-19 NOTE — Progress Notes (Signed)
Breastfeeding tip of the week reviewed. 

## 2015-02-19 NOTE — Progress Notes (Signed)
Patient received NST today and needed to leave for work. She was not seen by MD.  #Preexisting Diabetes, Class R:  I reviewed her medications and of note she is on insulin and was not able to review her blood sugar values. She had a reassuring NST today.  #cHTN: wnl today, stable. Will need to stop ASA at 37 weeks in preparation for CS #Mode of delivery - RLTCS with BTL, schedule for 39 weeks  Federico Flake, MD

## 2015-02-19 NOTE — Progress Notes (Signed)
Maysel was waiting in room for ob follow up. After a period of waiting, states she cannot wait any longer , has to get to work. Has appointments scheduled for follow up for next week and nst for later this week.

## 2015-02-22 ENCOUNTER — Ambulatory Visit (INDEPENDENT_AMBULATORY_CARE_PROVIDER_SITE_OTHER): Payer: 59 | Admitting: *Deleted

## 2015-02-22 DIAGNOSIS — O24913 Unspecified diabetes mellitus in pregnancy, third trimester: Secondary | ICD-10-CM | POA: Diagnosis not present

## 2015-02-22 DIAGNOSIS — O10019 Pre-existing essential hypertension complicating pregnancy, unspecified trimester: Secondary | ICD-10-CM | POA: Diagnosis not present

## 2015-02-22 NOTE — Progress Notes (Signed)
NST performed today was reviewed and was found to be reactive.  AFI normal at 19.7 cm.  Continue recommended antenatal testing and prenatal care.

## 2015-02-26 ENCOUNTER — Ambulatory Visit (INDEPENDENT_AMBULATORY_CARE_PROVIDER_SITE_OTHER): Payer: 59 | Admitting: Obstetrics & Gynecology

## 2015-02-26 VITALS — BP 143/68 | HR 89 | Wt 231.5 lb

## 2015-02-26 DIAGNOSIS — E139 Other specified diabetes mellitus without complications: Secondary | ICD-10-CM

## 2015-02-26 DIAGNOSIS — O0993 Supervision of high risk pregnancy, unspecified, third trimester: Secondary | ICD-10-CM

## 2015-02-26 LAB — POCT URINALYSIS DIP (DEVICE)
BILIRUBIN URINE: NEGATIVE
Glucose, UA: NEGATIVE mg/dL
HGB URINE DIPSTICK: NEGATIVE
KETONES UR: NEGATIVE mg/dL
LEUKOCYTES UA: NEGATIVE
NITRITE: NEGATIVE
Protein, ur: NEGATIVE mg/dL
Specific Gravity, Urine: 1.015 (ref 1.005–1.030)
Urobilinogen, UA: 1 mg/dL (ref 0.0–1.0)
pH: 5.5 (ref 5.0–8.0)

## 2015-02-26 NOTE — Progress Notes (Signed)
Subjective:  Melissa Braun is a 39 y.o. G2P1001 at [redacted]w[redacted]d being seen today for ongoing prenatal care.  Patient reports a feeling of hardness in mons pubis.  Contractions: Not present.  Vag. Bleeding: None. Movement: Present. Denies leaking of fluid.   The following portions of the patient's history were reviewed and updated as appropriate: allergies, current medications, past family history, past medical history, past social history, past surgical history and problem list.   Objective:   Filed Vitals:   02/26/15 0752  BP: 143/68  Pulse: 89  Weight: 231 lb 8 oz (105.008 kg)    Fetal Status: Fetal Heart Rate (bpm): NST   Movement: Present     General:  Alert, oriented and cooperative. Patient is in no acute distress.  Skin: Skin is warm and dry. No rash noted.   Cardiovascular: Normal heart rate noted  Respiratory: Normal respiratory effort, no problems with respiration noted  Abdomen: Soft, gravid, appropriate for gestational age. Pain/Pressure: Absent     Pelvic: Vag. Bleeding: None     Cervical exam deferred        Extremities: Normal range of motion.     Mental Status: Normal mood and affect. Normal behavior. Normal judgment and thought content.   Mons Pubis--?induration but no erythema or redness, no fluctuant area.  Pt to monitor for signs of infection.  Encourage clipping and not shaving.  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  Rare am low in high 50s.  Some 120s pp dinner;  Overall doing well. - Fetal nonstress test - [place] MFM FETAL BPP WO NON STRESS  2. Hypertension--controlled  3.  For rpt C/S and BTS.  Preterm labor symptoms and general obstetric precautions including but not limited to vaginal bleeding, contractions, leaking of fluid and fetal movement were reviewed in detail with the patient. Please refer to After Visit  Summary for other counseling recommendations.  Return in about 1 week (around 03/05/2015).   Lesly Dukes, MD

## 2015-02-26 NOTE — Progress Notes (Signed)
Breastfeeding tip of the week reviewed NST/OBF

## 2015-02-27 ENCOUNTER — Encounter: Payer: Self-pay | Admitting: *Deleted

## 2015-02-27 NOTE — Progress Notes (Signed)
Spouse's FMLA completed/faxed

## 2015-03-01 ENCOUNTER — Other Ambulatory Visit: Payer: Self-pay | Admitting: Obstetrics & Gynecology

## 2015-03-01 ENCOUNTER — Other Ambulatory Visit: Payer: 59

## 2015-03-01 ENCOUNTER — Ambulatory Visit (HOSPITAL_COMMUNITY)
Admission: RE | Admit: 2015-03-01 | Discharge: 2015-03-01 | Disposition: A | Discharge status: Home / Self Care | Payer: 59 | Source: Ambulatory Visit | Attending: Obstetrics & Gynecology | Admitting: Obstetrics & Gynecology

## 2015-03-01 DIAGNOSIS — Z3A35 35 weeks gestation of pregnancy: Secondary | ICD-10-CM

## 2015-03-01 DIAGNOSIS — E669 Obesity, unspecified: Secondary | ICD-10-CM | POA: Diagnosis not present

## 2015-03-01 DIAGNOSIS — O99213 Obesity complicating pregnancy, third trimester: Secondary | ICD-10-CM

## 2015-03-01 DIAGNOSIS — O2690 Pregnancy related conditions, unspecified, unspecified trimester: Secondary | ICD-10-CM

## 2015-03-01 DIAGNOSIS — O09523 Supervision of elderly multigravida, third trimester: Secondary | ICD-10-CM | POA: Insufficient documentation

## 2015-03-01 DIAGNOSIS — O34219 Maternal care for unspecified type scar from previous cesarean delivery: Secondary | ICD-10-CM | POA: Diagnosis not present

## 2015-03-01 DIAGNOSIS — O352XX0 Maternal care for (suspected) hereditary disease in fetus, not applicable or unspecified: Secondary | ICD-10-CM | POA: Insufficient documentation

## 2015-03-01 DIAGNOSIS — O24313 Unspecified pre-existing diabetes mellitus in pregnancy, third trimester: Secondary | ICD-10-CM | POA: Insufficient documentation

## 2015-03-05 ENCOUNTER — Ambulatory Visit (INDEPENDENT_AMBULATORY_CARE_PROVIDER_SITE_OTHER): Payer: 59 | Admitting: Obstetrics & Gynecology

## 2015-03-05 ENCOUNTER — Other Ambulatory Visit (HOSPITAL_COMMUNITY)
Admission: RE | Admit: 2015-03-05 | Discharge: 2015-03-05 | Disposition: A | Discharge status: Home / Self Care | Payer: 59 | Source: Ambulatory Visit | Attending: Obstetrics & Gynecology | Admitting: Obstetrics & Gynecology

## 2015-03-05 VITALS — BP 161/87 | HR 94 | Wt 235.1 lb

## 2015-03-05 DIAGNOSIS — O24913 Unspecified diabetes mellitus in pregnancy, third trimester: Secondary | ICD-10-CM

## 2015-03-05 DIAGNOSIS — O2343 Unspecified infection of urinary tract in pregnancy, third trimester: Secondary | ICD-10-CM

## 2015-03-05 DIAGNOSIS — O10013 Pre-existing essential hypertension complicating pregnancy, third trimester: Secondary | ICD-10-CM | POA: Diagnosis not present

## 2015-03-05 DIAGNOSIS — Z113 Encounter for screening for infections with a predominantly sexual mode of transmission: Secondary | ICD-10-CM

## 2015-03-05 DIAGNOSIS — O10019 Pre-existing essential hypertension complicating pregnancy, unspecified trimester: Secondary | ICD-10-CM | POA: Diagnosis not present

## 2015-03-05 LAB — POCT URINALYSIS DIP (DEVICE)
Bilirubin Urine: NEGATIVE
Glucose, UA: NEGATIVE mg/dL
KETONES UR: NEGATIVE mg/dL
Nitrite: POSITIVE — AB
PROTEIN: NEGATIVE mg/dL
Specific Gravity, Urine: 1.015 (ref 1.005–1.030)
Urobilinogen, UA: 1 mg/dL (ref 0.0–1.0)
pH: 5.5 (ref 5.0–8.0)

## 2015-03-05 MED ORDER — CEPHALEXIN 500 MG PO CAPS: 500.0000 mg | ORAL_CAPSULE | Freq: Four times a day (QID) | ORAL | Status: DC

## 2015-03-05 MED ORDER — NOVOLOG MIX 70/30 FLEXPEN (70-30) 100 UNIT/ML ~~LOC~~ SUPN: PEN_INJECTOR | SUBCUTANEOUS | Status: DC

## 2015-03-05 MED ORDER — NIFEDIPINE ER OSMOTIC RELEASE 30 MG PO TB24: 30.0000 mg | ORAL_TABLET | Freq: Two times a day (BID) | ORAL | Status: DC

## 2015-03-05 NOTE — Progress Notes (Addendum)
Subjective:  Melissa Braun is a 39 y.o. G2P1001 at 63w5dbeing seen today for ongoing prenatal care.  Patient reports no complaints.  Contractions: Not present.  Vag. Bleeding: None. Movement: Present. Denies leaking of fluid.   The following portions of the patient's history were reviewed and updated as appropriate: allergies, current medications, past family history, past medical history, past social history, past surgical history and problem list.   Objective:   Filed Vitals:   03/05/15 0807 03/05/15 0841  BP: 159/84 161/87  Pulse: 94   Weight: 235 lb 1.6 oz (106.641 kg)     Fetal Status: Fetal Heart Rate (bpm): NST   Movement: Present     General:  Alert, oriented and cooperative. Patient is in no acute distress.  Skin: Skin is warm and dry. No rash noted.   Cardiovascular: Normal heart rate noted  Respiratory: Normal respiratory effort, no problems with respiration noted  Abdomen: Soft, gravid, appropriate for gestational age. Pain/Pressure: Absent     Pelvic: Vag. Bleeding: None   Cervical exam performed Dilation: Closed Effacement (%): Thick Station: Ballotable  Cultures done today.  Extremities: Normal range of motion.  Edema: Mild pitting, slight indentation  Mental Status: Normal mood and affect. Normal behavior. Normal judgment and thought content.   Urinalysis: Urine Protein: Negative Urine Glucose: Negative  Blood sugars: Mostly within range, insulin refilled NST performed today was reviewed and was found to be reactive.  Continue recommended antenatal testing and prenatal care.  Results for orders placed or performed in visit on 03/05/15 (from the past 24 hour(s))  POCT urinalysis dip (device)     Status: Abnormal   Collection Time: 03/05/15  8:14 AM  Result Value Ref Range   Glucose, UA NEGATIVE NEGATIVE mg/dL   Bilirubin Urine NEGATIVE NEGATIVE   Ketones, ur NEGATIVE NEGATIVE mg/dL   Specific Gravity, Urine 1.015 1.005 - 1.030   Hgb urine dipstick TRACE (A)  NEGATIVE   pH 5.5 5.0 - 8.0   Protein, ur NEGATIVE NEGATIVE mg/dL   Urobilinogen, UA 1.0 0.0 - 1.0 mg/dL   Nitrite POSITIVE (A) NEGATIVE   Leukocytes, UA TRACE (A) NEGATIVE    Assessment and Plan:  Pregnancy: G2P1001 at 34w5d1. Benign essential hypertension antepartum Elevated BP today; denies any headache, vision changes, RUQ/epigastric pain.  Will check labs today, start Nifedipine.  Patient counseled that severe uncontrolled HTN in pregnancy can constitute an indication for eaarly delivery. Will continue antenatal testing, serial ultrasounds and close observation.  Preeclampsia precautions reviewed. - Fetal nonstress test - GC/Chlamydia probe amp ([place] [facility] [place] CBC - Comp Met (CMET) - Protein / Creatinine Ratio, Urine - Culture, beta strep (group b only) - NIFEdipine (PROCARDIA-XL/ADALAT-CC/NIFEDICAL-XL) 30 MG 24 hr tablet; Take 1 tablet (30 mg total) by mouth 2 (two) times daily with a meal.  Dispense: 60 tablet; Refill: 2  2. Diabetes mellitus during pregnancy, antepartum, third trimester Will continue current regimen - Fetal nonstress test - NOVOLOG MIX 70/30 FLEXPEN (70-30) 100 UNIT/ML FlexPen; Inject subcutaneously 12 to 24 units two times daily  with meals  Dispense: 45 mL; Refill: 7  3. UTI in pregnancy, antepartum, third trimester UA with positive nitrites, patient is asymptomatic.  Will send culture, Keflex presumptively prescribed. - Culture, OB Urine - cephALEXin (KEFLEX) 500 MG capsule; Take 1 capsule (500 mg total) by mouth 4 (four) times daily.  Dispense: 28 capsule; Refill: 2  Preterm labor symptoms and general obstetric precautions including but not limited to vaginal bleeding,  contractions, leaking of fluid and fetal movement were reviewed in detail with the patient. Please refer to After Visit Summary for other counseling recommendations.  Return in about 7 days (around 03/12/2015) for Ob fu and NST as scheduled.   Osborne Oman, MD

## 2015-03-05 NOTE — Progress Notes (Signed)
Pt denies H/A or visual disturbances.  Udip positive for nitrites, trace Hgb and trace leuks today. [place] for growth and BPP on 10/14

## 2015-03-05 NOTE — Patient Instructions (Signed)
Return to clinic for any obstetric concerns or go to MAU for evaluation  

## 2015-03-06 ENCOUNTER — Telehealth: Payer: Self-pay | Admitting: *Deleted

## 2015-03-06 DIAGNOSIS — O2343 Unspecified infection of urinary tract in pregnancy, third trimester: Secondary | ICD-10-CM

## 2015-03-06 DIAGNOSIS — O10019 Pre-existing essential hypertension complicating pregnancy, unspecified trimester: Secondary | ICD-10-CM

## 2015-03-06 LAB — COMPREHENSIVE METABOLIC PANEL
ALBUMIN: 3.1 g/dL — AB (ref 3.5–5.5)
ALK PHOS: 95 IU/L (ref 39–117)
ALT: 11 IU/L (ref 0–32)
AST: 15 IU/L (ref 0–40)
Albumin/Globulin Ratio: 1.3 (ref 1.1–2.5)
BUN/Creatinine Ratio: 12 (ref 8–20)
BUN: 7 mg/dL (ref 6–20)
Bilirubin Total: 0.3 mg/dL (ref 0.0–1.2)
CALCIUM: 8.4 mg/dL — AB (ref 8.7–10.2)
CO2: 19 mmol/L (ref 18–29)
CREATININE: 0.6 mg/dL (ref 0.57–1.00)
Chloride: 104 mmol/L (ref 97–108)
GFR calc Af Amer: 134 mL/min/{1.73_m2} (ref >59)
GFR, EST NON AFRICAN AMERICAN: 116 mL/min/{1.73_m2} (ref >59)
GLOBULIN, TOTAL: 2.3 g/dL (ref 1.5–4.5)
GLUCOSE: 84 mg/dL (ref 65–99)
Potassium: 3.8 mmol/L (ref 3.5–5.2)
SODIUM: 137 mmol/L (ref 134–144)
Total Protein: 5.4 g/dL — ABNORMAL LOW (ref 6.0–8.5)

## 2015-03-06 LAB — CBC
HEMATOCRIT: 38.9 % (ref 34.0–46.6)
HEMOGLOBIN: 13.1 g/dL (ref 11.1–15.9)
MCH: 29.7 pg (ref 26.6–33.0)
MCHC: 33.7 g/dL (ref 31.5–35.7)
MCV: 88 fL (ref 79–97)
Platelets: 127 10*3/uL — ABNORMAL LOW (ref 150–379)
RBC: 4.41 x10E6/uL (ref 3.77–5.28)
RDW: 14.3 % (ref 12.3–15.4)
WBC: 8.5 10*3/uL (ref 3.4–10.8)

## 2015-03-06 LAB — PROTEIN / CREATININE RATIO, URINE
CREATININE, UR: 41.5 mg/dL
PROTEIN UR: 30.3 mg/dL
Protein/Creat Ratio: 730 mg/g creat — ABNORMAL HIGH (ref 0–200)

## 2015-03-06 MED ORDER — NIFEDIPINE ER OSMOTIC RELEASE 30 MG PO TB24: 30.0000 mg | ORAL_TABLET | Freq: Two times a day (BID) | ORAL | Status: DC

## 2015-03-06 MED ORDER — CEPHALEXIN 500 MG PO CAPS: 500.0000 mg | ORAL_CAPSULE | Freq: Four times a day (QID) | ORAL | Status: DC

## 2015-03-06 NOTE — Telephone Encounter (Signed)
Pt left message stating that her prescriptions from yesterday were not sent to the [facility]. She requested to have them re-sent. I reviewed chart and observed that both prescriptions were sent to the preferred pharmacy listed - [facility]. I verified that the prescriptions were received by [facility] however since they are a mail order pharmacy, the turn around time for pt to receive medication is 7-10 days. I cancelled both Rx with them and will re-issue to pt's pharmacy.  I called pt and left message for her to call back and give the street location of her [facility] so that we can send the prescriptions.

## 2015-03-06 NOTE — Progress Notes (Signed)
Quick Note:  Patient has abnormal labs with elevated protein in urine and low platelets of 127K. Please call her and evaluate her symptoms. Patient needs to come in today (or tomorrow morning latest) for repeat BP check. If still concerning for severe preeclampsia, she will need delivery.  Jaynie Collins, MD, FACOG Attending Obstetrician & Gynecologist, [facility]  ______

## 2015-03-06 NOTE — Telephone Encounter (Signed)
Per Dr Macon Large, Patient has abnormal labs with elevated protein in urine and low platelets of 127K. Please call her and evaluate her symptoms. Patient needs to come in today (or tomorrow morning latest) for repeat BP check. If still concerning for severe preeclampsia, she will need delivery. Called patient and informed her of results. Patient verbalized understanding and denies increased edema, headaches or blurry vision. Patient states she has not received her prescriptions yet. Per chart review, prescriptions went through mail service. [facility] and informed [facility]. Patient verbalized understanding and states she can come Thursday at 730am for blood pressure check. Patient had no questions

## 2015-03-06 NOTE — Telephone Encounter (Addendum)
Called pt @ 1425 after telephone consult with Dr. Macon Large. She stated that she had just finished speaking to [place] and will come in for BP check in 2 days on 10/13.  I informed pt that Dr. Macon Large advises not waiting beyond tomorrow morning at the latest to re-check BP due to the increased concern for pre-clampsia in light of abnormal lab results.  Pt voiced understanding and agreed to come in tomorrow @ 0730 for BP check.

## 2015-03-07 ENCOUNTER — Ambulatory Visit: Payer: 59 | Admitting: General Practice

## 2015-03-07 VITALS — BP 157/83 | HR 90

## 2015-03-07 DIAGNOSIS — Z0130 Encounter for examination of blood pressure without abnormal findings: Secondary | ICD-10-CM

## 2015-03-07 LAB — GC/CHLAMYDIA PROBE AMP (~~LOC~~) NOT AT ARMC
Chlamydia: NEGATIVE
Neisseria Gonorrhea: NEGATIVE

## 2015-03-07 NOTE — Progress Notes (Signed)
Patient here for repeat blood pressure check. Edema continues to be 2-3 + pitting. Patient denies headaches, dizziness, blurry vision. Patient took first dose of procardia last dose at 7-8pm and again 20 minutes ago. Reviewed patient's hx with [place]ia Smith and blood pressures. Dorathy KinsmanVirginia Smith fine with patient following up for repeat check with MFM on Friday. Patient verbalized understanding and is aware to contact us with any changes.

## 2015-03-08 ENCOUNTER — Other Ambulatory Visit: Payer: 59

## 2015-03-08 LAB — URINE CULTURE, OB REFLEX

## 2015-03-08 LAB — CULTURE, OB URINE

## 2015-03-09 ENCOUNTER — Ambulatory Visit (HOSPITAL_COMMUNITY)
Admission: RE | Admit: 2015-03-09 | Discharge: 2015-03-09 | Disposition: A | Discharge status: Home / Self Care | Payer: 59 | Source: Ambulatory Visit | Attending: Family Medicine | Admitting: Family Medicine

## 2015-03-09 ENCOUNTER — Encounter (HOSPITAL_COMMUNITY): Payer: Self-pay

## 2015-03-09 DIAGNOSIS — O24312 Unspecified pre-existing diabetes mellitus in pregnancy, second trimester: Secondary | ICD-10-CM | POA: Diagnosis not present

## 2015-03-09 DIAGNOSIS — O09522 Supervision of elderly multigravida, second trimester: Secondary | ICD-10-CM | POA: Diagnosis not present

## 2015-03-09 LAB — CULTURE, BETA STREP (GROUP B ONLY): Strep Gp B Culture: NEGATIVE

## 2015-03-12 ENCOUNTER — Ambulatory Visit (INDEPENDENT_AMBULATORY_CARE_PROVIDER_SITE_OTHER): Payer: 59 | Admitting: Obstetrics & Gynecology

## 2015-03-12 ENCOUNTER — Inpatient Hospital Stay (HOSPITAL_COMMUNITY)
Admission: AD | Admit: 2015-03-12 | Discharge: 2015-03-12 | Disposition: A | Discharge status: Home / Self Care | Payer: 59 | Source: Ambulatory Visit | Attending: Family Medicine | Admitting: Family Medicine

## 2015-03-12 ENCOUNTER — Encounter (HOSPITAL_COMMUNITY): Payer: Self-pay

## 2015-03-12 VITALS — BP 166/88 | HR 97 | Wt 241.7 lb

## 2015-03-12 DIAGNOSIS — O10019 Pre-existing essential hypertension complicating pregnancy, unspecified trimester: Secondary | ICD-10-CM

## 2015-03-12 DIAGNOSIS — O34219 Maternal care for unspecified type scar from previous cesarean delivery: Secondary | ICD-10-CM | POA: Insufficient documentation

## 2015-03-12 DIAGNOSIS — O99840 Bariatric surgery status complicating pregnancy, unspecified trimester: Secondary | ICD-10-CM | POA: Diagnosis present

## 2015-03-12 DIAGNOSIS — R030 Elevated blood-pressure reading, without diagnosis of hypertension: Secondary | ICD-10-CM | POA: Diagnosis present

## 2015-03-12 DIAGNOSIS — E139 Other specified diabetes mellitus without complications: Secondary | ICD-10-CM | POA: Diagnosis present

## 2015-03-12 DIAGNOSIS — O1213 Gestational proteinuria, third trimester: Secondary | ICD-10-CM

## 2015-03-12 DIAGNOSIS — O24919 Unspecified diabetes mellitus in pregnancy, unspecified trimester: Secondary | ICD-10-CM | POA: Diagnosis present

## 2015-03-12 DIAGNOSIS — O10913 Unspecified pre-existing hypertension complicating pregnancy, third trimester: Secondary | ICD-10-CM | POA: Insufficient documentation

## 2015-03-12 DIAGNOSIS — O99843 Bariatric surgery status complicating pregnancy, third trimester: Secondary | ICD-10-CM | POA: Diagnosis not present

## 2015-03-12 DIAGNOSIS — O24913 Unspecified diabetes mellitus in pregnancy, third trimester: Secondary | ICD-10-CM | POA: Diagnosis not present

## 2015-03-12 DIAGNOSIS — O24419 Gestational diabetes mellitus in pregnancy, unspecified control: Secondary | ICD-10-CM | POA: Insufficient documentation

## 2015-03-12 DIAGNOSIS — Z9884 Bariatric surgery status: Secondary | ICD-10-CM

## 2015-03-12 DIAGNOSIS — Z9889 Other specified postprocedural states: Secondary | ICD-10-CM

## 2015-03-12 DIAGNOSIS — Z3A36 36 weeks gestation of pregnancy: Secondary | ICD-10-CM | POA: Diagnosis not present

## 2015-03-12 DIAGNOSIS — E103599 Type 1 diabetes mellitus with proliferative diabetic retinopathy without macular edema, unspecified eye: Secondary | ICD-10-CM

## 2015-03-12 LAB — COMPREHENSIVE METABOLIC PANEL
ALBUMIN: 3 g/dL — AB (ref 3.5–5.0)
ALK PHOS: 100 U/L (ref 38–126)
ALT: 17 U/L (ref 14–54)
ANION GAP: 3 — AB (ref 5–15)
AST: 24 U/L (ref 15–41)
BILIRUBIN TOTAL: 0.7 mg/dL (ref 0.3–1.2)
BUN: 10 mg/dL (ref 6–20)
CALCIUM: 8.3 mg/dL — AB (ref 8.9–10.3)
CO2: 20 mmol/L — AB (ref 22–32)
CREATININE: 0.56 mg/dL (ref 0.44–1.00)
Chloride: 111 mmol/L (ref 101–111)
GFR calc Af Amer: >60 mL/min (ref >60)
GFR calc non Af Amer: >60 mL/min (ref >60)
GLUCOSE: 68 mg/dL (ref 65–99)
Potassium: 4.1 mmol/L (ref 3.5–5.1)
Sodium: 134 mmol/L — ABNORMAL LOW (ref 135–145)
TOTAL PROTEIN: 6.4 g/dL — AB (ref 6.5–8.1)

## 2015-03-12 LAB — PROTEIN / CREATININE RATIO, URINE
Creatinine, Urine: 31 mg/dL
PROTEIN CREATININE RATIO: 0.23 mg/mg{creat} — AB (ref 0.00–0.15)
Total Protein, Urine: 7 mg/dL

## 2015-03-12 LAB — CBC
HEMATOCRIT: 40.5 % (ref 36.0–46.0)
HEMOGLOBIN: 14.4 g/dL (ref 12.0–15.0)
MCH: 30.2 pg (ref 26.0–34.0)
MCHC: 35.6 g/dL (ref 30.0–36.0)
MCV: 84.9 fL (ref 78.0–100.0)
Platelets: 133 10*3/uL — ABNORMAL LOW (ref 150–400)
RBC: 4.77 MIL/uL (ref 3.87–5.11)
RDW: 13.4 % (ref 11.5–15.5)
WBC: 9.5 10*3/uL (ref 4.0–10.5)

## 2015-03-12 LAB — POCT URINALYSIS DIP (DEVICE)
Bilirubin Urine: NEGATIVE
Glucose, UA: NEGATIVE mg/dL
HGB URINE DIPSTICK: NEGATIVE
KETONES UR: NEGATIVE mg/dL
Nitrite: NEGATIVE
PROTEIN: NEGATIVE mg/dL
SPECIFIC GRAVITY, URINE: 1.015 (ref 1.005–1.030)
UROBILINOGEN UA: 1 mg/dL (ref 0.0–1.0)
pH: 5.5 (ref 5.0–8.0)

## 2015-03-12 MED ORDER — NIFEDIPINE ER 60 MG PO TB24: 60.0000 mg | ORAL_TABLET | Freq: Two times a day (BID) | ORAL | Status: DC

## 2015-03-12 NOTE — MAU Provider Note (Signed)
History   CSN: [id number] Arrival date and time: 03/12/15 [id number] First Provider Initiated Contact with Patient 03/12/15 1032    Chief Complaint  Patient presents with  . Hypertension  Patient is 39 y.o. G2P1001 9033w5d here with complaints of elevated BP while in clinic for her NST this AM. +FM, denies LOF, VB, contractions, vaginal discharge.  No vision changes, blurry vision, seeing spots. No SOB, Reports mild HA due to hunger. Denies RUQ pain. No increased hand or foot swelling.   Hypertension Pertinent negatives include no blurred vision, chest pain, headaches, orthopnea or shortness of breath.    OB History    Gravida Para Term Preterm AB TAB SAB Ectopic Multiple Living   2 1 1  0 0 0 0 0 0 1      Past Medical History  Diagnosis Date  . Diabetes mellitus AGE 57    DR Talmage NapBALAN  . Infertility   . Acne   . LGSIL (low grade squamous intraepithelial dysplasia) 2008  . Sickle cell trait (HCC)   . Heart murmur   . Hypertension     past h/o elevated BP with taking Tylenol Sinus  . Abnormal Pap smear   . Pregnancy induced hypertension     Past Surgical History  Procedure Laterality Date  . Exploratory laparoscopy      DUE TO RLQ PAIN  . Gastric bypass  nov. 2011    [facility] [place]  . Cesarean section N/A 09/04/2012    Procedure: Primary cesarean section with delivery of baby boy at 1126.  Apgars 5/7.;  Surgeon: Lesly DukesKelly H Leggett, MD;  Location: [facility];  Service: Obstetrics;  Laterality: N/A;    Family History  Problem Relation Age of Onset  . Diabetes Father   . Hypertension Father   . Cancer Father     prostate and mouth  . Sickle cell anemia Mother   . Anemia Mother   . Sickle cell trait Sister   . Hypertension Sister     Social History  Substance Use Topics  . Smoking status: Never Smoker   . Smokeless tobacco: Never Used  . Alcohol Use: No    Allergies:  Allergies  Allergen Reactions  . Bactrim Hives  . Doxycycline Hives       . Levofloxacin Itching and Other  (See Comments)    Reaction unknown    Prescriptions prior to admission  Medication Sig Dispense Refill Last Dose  . ACCU-CHEK FASTCLIX LANCETS MISC 1 Units by Percutaneous route 4 (four) times daily. 100 each 12 Taking  . ampicillin (PRINCIPEN) 250 MG capsule Take 250 mg by mouth daily.   Taking  . aspirin 81 MG chewable tablet Chew 1 tablet (81 mg total) by mouth daily. 30 tablet 6 Taking  . cephALEXin (KEFLEX) 500 MG capsule Take 1 capsule (500 mg total) by mouth 4 (four) times daily. 28 capsule 2 Taking  . Cholecalciferol (VITAMIN D) 2000 UNITS tablet Take 1 tablet (2,000 Units total) by mouth daily. 30 tablet 3 Taking  . erythromycin with ethanol (EMGEL) 2 % gel 1 APPLICATION TO AFFECTED AREA TWICE A DAY EXTERNALLY 30 DAYS  2 Taking  . ferrous sulfate 325 (65 FE) MG tablet Take 1 tablet (325 mg total) by mouth 2 (two) times daily with a meal. 60 tablet 3 Taking  . glucose blood (ACCU-CHEK SMARTVIEW) test strip Check blood sugars 4x/daily 100 each 12 Taking  . NIFEdipine (PROCARDIA-XL/ADALAT-CC/NIFEDICAL-XL) 30 MG 24 hr tablet Take 1 tablet (30 mg total) by mouth 2 (  two) times daily with a meal. 60 tablet 2 Taking  . NOVOLOG MIX 70/30 FLEXPEN (70-30) 100 UNIT/ML FlexPen Inject subcutaneously 12 to 24 units two times daily  with meals 45 mL 7 Taking  . Prenatal Vit-Fe Fumarate-FA (PRENATAL MULTIVITAMIN) TABS Take 1 tablet by mouth daily.   Taking    Review of Systems  Constitutional: Negative for fever and chills.  Eyes: Negative for blurred vision and double vision.  Respiratory: Negative for cough and shortness of breath.   Cardiovascular: Negative for chest pain and orthopnea.  Gastrointestinal: Negative for nausea and vomiting.  Genitourinary: Negative for dysuria, frequency and flank pain.  Musculoskeletal: Negative for myalgias.  Skin: Negative for rash.  Neurological: Negative for dizziness, tingling, weakness and headaches.  Endo/Heme/Allergies: Does not bruise/bleed easily.   Psychiatric/Behavioral: Negative for depression and suicidal ideas. The patient is not nervous/anxious.    Physical Exam  Blood pressure 161/84, pulse 98, temperature 97.4 F (36.3 C), temperature source Oral, resp. rate 18, last menstrual period 06/28/2014.  Physical Exam  Nursing note and vitals reviewed. Constitutional: She is oriented to person, place, and time. She appears well-developed and well-nourished. No distress.  Pregnant female  HENT:  Head: Normocephalic and atraumatic.  Eyes: Conjunctivae are normal. No scleral icterus.  Neck: Normal range of motion. Neck supple.  Cardiovascular: Normal rate and intact distal pulses.   Respiratory: Effort normal. She exhibits no tenderness.  GI: Soft. There is no tenderness. There is no rebound and no guarding.  Gravid  Genitourinary: Vagina normal.  Musculoskeletal: Normal range of motion. She exhibits no edema.  Neurological: She is alert and oriented to person, place, and time.  Skin: Skin is warm and dry. No rash noted.  Psychiatric: She has a normal mood and affect.    MAU Course  Procedures MDM UPC 0.23 CMP wnl  NST 135/mod/+accels/ no decels Toco- quiet  Assessment and Plan  Melissa Braun 39 y.o. G2P1001 at [redacted]w[redacted]d with pregnancy complicated by cHTN, Gestational Diabetes-Class C,R, sp gastric bypass, history of CSx1   #Elevated BP with known cHTN: patient has h/o preeclampsia.  Labs s/f for increased proteinuria but otherwise not concerning for PreX. Patient is at risk for this givne her history of T2DM, cHTN, and prior history of Prex.  - Increased procardia-  BID - Follow up in clinic for NSTs - Reviewed in detail reasons to return to MAU and s/sx of preeclampsia.   #FWB: Cat I, reactive.   Isa Rankin [facility] 03/12/2015, 10:32 AM

## 2015-03-12 NOTE — Progress Notes (Signed)
US for growth and BPP done 10/14.  Pt denies H/A or visual disturbances.

## 2015-03-12 NOTE — Discharge Instructions (Signed)

## 2015-03-12 NOTE — MAU Note (Signed)
Pt in clinic for NST & appt this a.m., BP elevated today - sent to MAU.  Pt on BP meds.  Pt denies HA, visual changes or epigastric pain.  Denies uc's bleeding or LOF.

## 2015-03-12 NOTE — Progress Notes (Signed)
Subjective:  Melissa Braun is a 39 y.o. G2P1001 at 4171w5d being seen today for ongoing prenatal care.  Patient reports no complaints.  Contractions: Not present.  Vag. Bleeding: None. Movement: Present. Denies leaking of fluid.   The following portions of the patient's history were reviewed and updated as appropriate: allergies, current medications, past family history, past medical history, past social history, past surgical history and problem list. Problem list updated.  Objective:   Filed Vitals:   03/12/15 0838 03/12/15 0843  BP: 167/82 166/88  Pulse: 97   Weight: 241 lb 11.2 oz (109.634 kg)     Fetal Status: Fetal Heart Rate (bpm): RNST   Movement: Present     General:  Alert, oriented and cooperative. Patient is in no acute distress.  Skin: Skin is warm and dry. No rash noted.   Cardiovascular: Normal heart rate noted  Respiratory: Normal respiratory effort, no problems with respiration noted  Abdomen: Soft, gravid, appropriate for gestational age. Pain/Pressure: Absent     Pelvic: Vag. Bleeding: None     Cervical exam deferred        Extremities: Normal range of motion.  Edema: Mild pitting, slight indentation  Mental Status: Normal mood and affect. Normal behavior. Normal judgment and thought content.   Urinalysis: Urine Protein: Negative Urine Glucose: Negative  Assessment and Plan:  Pregnancy: G2P1001 at 3971w5d  1. Benign essential hypertension antepartum -?superimposed preeclampsia.  BP in severe range again (came down last week with addition of procardia).  PR/Cr ration at [facility] 730?? But not spilling protein in urine today.  Will send patient to MAU for BP serial, stat labs, and treatment for BP.  - Fetal nonstress test - Vitamin D (25 hydroxy) - Calcium - RBC Folate - B12 - Vitamin B1  2. Diabetes mellitus during pregnancy, antepartum, third trimester -all cbgs are within range--good control - Fetal nonstress test - Vitamin D (25 hydroxy) - Calcium -  RBC Folate - B12 - Vitamin B1  3. History of gastric bypass  - Vitamin D (25 hydroxy) - Calcium - RBC Folate - B12 - Vitamin B1  4. 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  PRE-E precautions given. Please refer to After Visit Summary for other counseling recommendations.   To MAU for eval.  Lesly DukesKelly H Barbra Miner, MD

## 2015-03-13 LAB — CALCIUM: Calcium: 8.7 mg/dL (ref 8.7–10.2)

## 2015-03-13 LAB — VITAMIN B12: VITAMIN B 12: 202 pg/mL — AB (ref 211–946)

## 2015-03-14 LAB — VITAMIN D 25 HYDROXY (VIT D DEFICIENCY, FRACTURES): VIT D 25 HYDROXY: 19.4 ng/mL — AB (ref 30.0–100.0)

## 2015-03-15 ENCOUNTER — Other Ambulatory Visit: Payer: Self-pay | Admitting: Obstetrics & Gynecology

## 2015-03-15 ENCOUNTER — Ambulatory Visit (INDEPENDENT_AMBULATORY_CARE_PROVIDER_SITE_OTHER): Payer: 59 | Admitting: *Deleted

## 2015-03-15 ENCOUNTER — Encounter: Payer: Self-pay | Admitting: *Deleted

## 2015-03-15 VITALS — BP 172/81 | Wt 245.7 lb

## 2015-03-15 DIAGNOSIS — O10019 Pre-existing essential hypertension complicating pregnancy, unspecified trimester: Secondary | ICD-10-CM

## 2015-03-15 DIAGNOSIS — O24913 Unspecified diabetes mellitus in pregnancy, third trimester: Secondary | ICD-10-CM

## 2015-03-15 DIAGNOSIS — Z9884 Bariatric surgery status: Secondary | ICD-10-CM

## 2015-03-15 MED ORDER — VITAMIN D3 1.25 MG (50000 UT) PO TABS: 1.0000 | ORAL_TABLET | ORAL | Status: DC

## 2015-03-15 MED ORDER — VITAMIN D3 1.25 MG (50000 UT) PO TABS: 1.0000 | ORAL_TABLET | Freq: Every day | ORAL | Status: DC

## 2015-03-15 NOTE — Progress Notes (Signed)
Noted patient weight gain 4 pounds in 3 days, increased edema, blood pressure slightly more elevated since Monday. Patient denies headaches. States she started new dose of procardia on Tuesday.  Called Dr. Debroah LoopArnold and reported these findings. Instructed to give patient letter to take her out of work effective tomorrow. Instructed patient she is to start her leave tomorrow - she plans to go in and finish up for her LOA. Instructed her is she has headaches unrelieved by tylenol, increase in edema to hand/face, etc ,or any other concerns to go to MAU at anytime, otherwise keep appointment for 03/19/15 as scheduled. Carollee HerterShannon voices understanding.

## 2015-03-15 NOTE — Progress Notes (Signed)
Vitamin D is low.  RX prescribed.  Pt to take for one month and needs to come in for a blood draw to check vitamin D level.

## 2015-03-16 ENCOUNTER — Telehealth: Payer: Self-pay | Admitting: *Deleted

## 2015-03-16 NOTE — Addendum Note (Signed)
Addended by: Jill SideAY, Shaneque Merkle L on: 03/16/2015 09:08 AM   Modules accepted: Orders

## 2015-03-16 NOTE — Telephone Encounter (Addendum)
Called patient per Dr Penne LashLeggett, patient has a low Vit D level and has a prescription waiting. She needs to return in one month for lab recheck. There was no answer, voice mail was left asking her to call the clinic regarding lab results and prescription.  Note made on pt's appt for 10/24 to inform pt of results and Rx needed.  Diane Day RNC

## 2015-03-19 ENCOUNTER — Encounter: Payer: Self-pay | Admitting: *Deleted

## 2015-03-19 ENCOUNTER — Telehealth: Payer: Self-pay | Admitting: *Deleted

## 2015-03-19 ENCOUNTER — Encounter: Payer: Self-pay | Admitting: Obstetrics and Gynecology

## 2015-03-19 ENCOUNTER — Ambulatory Visit (INDEPENDENT_AMBULATORY_CARE_PROVIDER_SITE_OTHER): Payer: 59 | Admitting: Obstetrics and Gynecology

## 2015-03-19 VITALS — BP 142/75 | HR 113 | Wt 257.4 lb

## 2015-03-19 DIAGNOSIS — O0993 Supervision of high risk pregnancy, unspecified, third trimester: Secondary | ICD-10-CM | POA: Diagnosis not present

## 2015-03-19 DIAGNOSIS — E569 Vitamin deficiency, unspecified: Secondary | ICD-10-CM

## 2015-03-19 DIAGNOSIS — E669 Obesity, unspecified: Secondary | ICD-10-CM

## 2015-03-19 DIAGNOSIS — O24913 Unspecified diabetes mellitus in pregnancy, third trimester: Secondary | ICD-10-CM

## 2015-03-19 DIAGNOSIS — O34219 Maternal care for unspecified type scar from previous cesarean delivery: Secondary | ICD-10-CM

## 2015-03-19 DIAGNOSIS — O09523 Supervision of elderly multigravida, third trimester: Secondary | ICD-10-CM

## 2015-03-19 DIAGNOSIS — O09293 Supervision of pregnancy with other poor reproductive or obstetric history, third trimester: Secondary | ICD-10-CM

## 2015-03-19 DIAGNOSIS — R823 Hemoglobinuria: Secondary | ICD-10-CM

## 2015-03-19 DIAGNOSIS — O99840 Bariatric surgery status complicating pregnancy, unspecified trimester: Secondary | ICD-10-CM

## 2015-03-19 DIAGNOSIS — O99213 Obesity complicating pregnancy, third trimester: Secondary | ICD-10-CM

## 2015-03-19 DIAGNOSIS — E103599 Type 1 diabetes mellitus with proliferative diabetic retinopathy without macular edema, unspecified eye: Secondary | ICD-10-CM

## 2015-03-19 DIAGNOSIS — O10019 Pre-existing essential hypertension complicating pregnancy, unspecified trimester: Secondary | ICD-10-CM

## 2015-03-19 DIAGNOSIS — R809 Proteinuria, unspecified: Secondary | ICD-10-CM

## 2015-03-19 LAB — POCT URINALYSIS DIP (DEVICE)
Bilirubin Urine: NEGATIVE
Glucose, UA: NEGATIVE mg/dL
KETONES UR: NEGATIVE mg/dL
Leukocytes, UA: NEGATIVE
Nitrite: NEGATIVE
PH: 5.5 (ref 5.0–8.0)
SPECIFIC GRAVITY, URINE: 1.02 (ref 1.005–1.030)
Urobilinogen, UA: 0.2 mg/dL (ref 0.0–1.0)

## 2015-03-19 LAB — FOLATE RBC
Folate, Hemolysate: 474.8 ng/mL
Folate, RBC: 1208 ng/mL (ref >498)

## 2015-03-19 LAB — VITAMIN B1

## 2015-03-19 NOTE — Progress Notes (Signed)
Moderate hemoglobin noted in urinalysis.  

## 2015-03-19 NOTE — Progress Notes (Signed)
Subjective:  Melissa Braun is a 39 y.o. G2P1001 at 5726w5d being seen today for ongoing prenatal care.  Patient reports no complaints.  Contractions: Not present.  Vag. Bleeding: None. Movement: Present. Denies leaking of fluid.   The following portions of the patient's history were reviewed and updated as appropriate: allergies, current medications, past family history, past medical history, past social history, past surgical history and problem list. Problem list updated.  Objective:   Filed Vitals:   03/19/15 0744  BP: 142/75  Pulse: 113  Weight: 257 lb 6.4 oz (116.756 kg)    Fetal Status: Fetal Heart Rate (bpm): NST   Movement: Present     General:  Alert, oriented and cooperative. Patient is in no acute distress.  Skin: Skin is warm and dry. No rash noted.   Cardiovascular: Normal heart rate noted  Respiratory: Normal respiratory effort, no problems with respiration noted  Abdomen: Soft, gravid, appropriate for gestational age. Pain/Pressure: Absent     Pelvic: Vag. Bleeding: None     Cervical exam deferred        Extremities: Normal range of motion.  Edema: Moderate pitting, indentation subsides rapidly  Mental Status: Normal mood and affect. Normal behavior. Normal judgment and thought content.   Urinalysis:      Assessment and Plan:  Pregnancy: G2P1001 at 7826w5d  1. Vitamin deficiency Patient informed of low levels and advised to pick up prescription  2. Benign essential hypertension antepartum Continue procardia   3. Diabetes mellitus during pregnancy, antepartum, third trimester Patient did not bring log book but reports highest fasting 88 and highest pp of 110 NST reviewed and reactive  4. Supervision of high risk pregnancy, antepartum, third trimester Patient is doing well without complaints Scheduled for repeat c-section on 11/2 with BTL  5 Previous cesarean delivery affecting pregnancy Desires repeat c-section  Term labor symptoms and general obstetric  precautions including but not limited to vaginal bleeding, contractions, leaking of fluid and fetal movement were reviewed in detail with the patient. Please refer to After Visit Summary for other counseling recommendations.  Return in about 3 days (around 03/22/2015) for 2x/wk as scheduled.   Catalina AntiguaPeggy Sudais Banghart, MD

## 2015-03-19 NOTE — Progress Notes (Signed)
Pt advised of low Vitamin D level.  She will obtain Rx today.

## 2015-03-19 NOTE — Addendum Note (Signed)
Addended by: Gerome ApleyZEYFANG, LINDA L on: 03/19/2015 10:48 AM   Modules accepted: Orders

## 2015-03-19 NOTE — Telephone Encounter (Signed)
Per Dr. Penne LashLeggett patient needs vitamin D and B12 rechecked one month after last draw on 03/14/15. So needs lab only about 04/14/15. Will route to registrars to make appointment. Put a note on next appointment to tell patient about lab only appointment.

## 2015-03-22 ENCOUNTER — Other Ambulatory Visit: Payer: 59

## 2015-03-22 ENCOUNTER — Ambulatory Visit (INDEPENDENT_AMBULATORY_CARE_PROVIDER_SITE_OTHER): Payer: 59 | Admitting: General Practice

## 2015-03-22 ENCOUNTER — Other Ambulatory Visit: Payer: Self-pay | Admitting: Obstetrics and Gynecology

## 2015-03-22 VITALS — BP 148/87 | HR 107

## 2015-03-22 DIAGNOSIS — O24913 Unspecified diabetes mellitus in pregnancy, third trimester: Secondary | ICD-10-CM

## 2015-03-22 DIAGNOSIS — Z23 Encounter for immunization: Secondary | ICD-10-CM | POA: Diagnosis not present

## 2015-03-22 DIAGNOSIS — O10019 Pre-existing essential hypertension complicating pregnancy, unspecified trimester: Secondary | ICD-10-CM

## 2015-03-22 DIAGNOSIS — O0993 Supervision of high risk pregnancy, unspecified, third trimester: Secondary | ICD-10-CM

## 2015-03-22 NOTE — Progress Notes (Signed)
Reviewed patient's BPs with Dr Debroah LoopArnold, no further evaluation needed at this time

## 2015-03-22 NOTE — Progress Notes (Signed)
Pt advised of need for re-check of Vit. D and B12.  Appt scheduled on 04/12/15 @ 0900.  Pt denies H/A or visual disturbances.

## 2015-03-22 NOTE — Progress Notes (Signed)
NST reviewed and reactive.  

## 2015-03-23 LAB — CULTURE, OB URINE

## 2015-03-26 ENCOUNTER — Ambulatory Visit (INDEPENDENT_AMBULATORY_CARE_PROVIDER_SITE_OTHER): Payer: 59 | Admitting: Obstetrics and Gynecology

## 2015-03-26 ENCOUNTER — Encounter: Payer: Self-pay | Admitting: Obstetrics and Gynecology

## 2015-03-26 VITALS — BP 152/82 | HR 106 | Wt 278.3 lb

## 2015-03-26 DIAGNOSIS — O24913 Unspecified diabetes mellitus in pregnancy, third trimester: Secondary | ICD-10-CM | POA: Diagnosis not present

## 2015-03-26 DIAGNOSIS — O99843 Bariatric surgery status complicating pregnancy, third trimester: Secondary | ICD-10-CM

## 2015-03-26 DIAGNOSIS — I10 Essential (primary) hypertension: Secondary | ICD-10-CM

## 2015-03-26 DIAGNOSIS — O99840 Bariatric surgery status complicating pregnancy, unspecified trimester: Secondary | ICD-10-CM

## 2015-03-26 DIAGNOSIS — O09293 Supervision of pregnancy with other poor reproductive or obstetric history, third trimester: Secondary | ICD-10-CM

## 2015-03-26 DIAGNOSIS — O99213 Obesity complicating pregnancy, third trimester: Secondary | ICD-10-CM

## 2015-03-26 DIAGNOSIS — E669 Obesity, unspecified: Secondary | ICD-10-CM

## 2015-03-26 DIAGNOSIS — O10013 Pre-existing essential hypertension complicating pregnancy, third trimester: Secondary | ICD-10-CM

## 2015-03-26 DIAGNOSIS — O09523 Supervision of elderly multigravida, third trimester: Secondary | ICD-10-CM

## 2015-03-26 DIAGNOSIS — O24112 Pre-existing diabetes mellitus, type 2, in pregnancy, second trimester: Secondary | ICD-10-CM

## 2015-03-26 DIAGNOSIS — E103599 Type 1 diabetes mellitus with proliferative diabetic retinopathy without macular edema, unspecified eye: Secondary | ICD-10-CM

## 2015-03-26 DIAGNOSIS — O10019 Pre-existing essential hypertension complicating pregnancy, unspecified trimester: Secondary | ICD-10-CM

## 2015-03-26 LAB — POCT URINALYSIS DIP (DEVICE)
BILIRUBIN URINE: NEGATIVE
Glucose, UA: NEGATIVE mg/dL
KETONES UR: NEGATIVE mg/dL
Leukocytes, UA: NEGATIVE
Nitrite: NEGATIVE
PH: 5.5 (ref 5.0–8.0)
Protein, ur: >=300 mg/dL — AB
SPECIFIC GRAVITY, URINE: 1.025 (ref 1.005–1.030)
Urobilinogen, UA: 0.2 mg/dL (ref 0.0–1.0)

## 2015-03-26 NOTE — Progress Notes (Signed)
Subjective:  Melissa Braun is a 39 y.o. G2P1001 at 4058w5d being seen today for ongoing prenatal care.  Patient reports bilateral lower extrmity swelling .  Contractions: Not present.  Vag. Bleeding: None. Movement: Present. Denies leaking of fluid.   The following portions of the patient's history were reviewed and updated as appropriate: allergies, current medications, past family history, past medical history, past social history, past surgical history and problem list. Problem list updated.  Objective:   Filed Vitals:   03/26/15 0808  BP: 152/82  Pulse: 106  Weight: 278 lb 4.8 oz (126.236 kg)    Fetal Status: Fetal Heart Rate (bpm): NST   Movement: Present     General:  Alert, oriented and cooperative. Patient is in no acute distress.  Skin: Skin is warm and dry. No rash noted.   Cardiovascular: Normal heart rate noted  Respiratory: Normal respiratory effort, no problems with respiration noted  Abdomen: Soft, gravid, appropriate for gestational age. Pain/Pressure: Present     Pelvic: Vag. Bleeding: None     Cervical exam deferred        Extremities: Normal range of motion.  Edema: Mild pitting, slight indentation  Mental Status: Normal mood and affect. Normal behavior. Normal judgment and thought content.   Urinalysis: Urine Protein: 4+ Urine Glucose: Negative  Assessment and Plan:  Pregnancy: G2P1001 at 6358w5d  1. Benign essential hypertension antepartum Patient did not take procardia this morning She denies headaches, visual disturbances, RUQ/epigastric pain Bilateral lower extremity swelling to the thighs, equal in size  2. Diabetes mellitus during pregnancy, antepartum, third trimester Patient did not bring log book but reports highest fasting level of 82 and highest pp 112 NST reviewed and reactive  3. AMA (advanced maternal age) multigravida 35+, third trimester Low risk NIPS  4. Previous cesarean section affecting pregnancy, antepartum Patient scheduled for  repeat c-section with BTL on 03/28/2015  Term labor and preeclampsia symptoms and general obstetric precautions including but not limited to vaginal bleeding, contractions, leaking of fluid and fetal movement were reviewed in detail with the patient. Please refer to After Visit Summary for other counseling recommendations.  Return in about 6 weeks (around 05/07/2015) for PP visit.  C/S on 11/2.   Catalina AntiguaPeggy Sigrid Schwebach, MD

## 2015-03-26 NOTE — Addendum Note (Signed)
Addended by: Jill SideAY, DIANE L on: 03/26/2015 02:31 PM   Modules accepted: Orders

## 2015-03-26 NOTE — Progress Notes (Signed)
Pt has severe swelling of legs - extremely tight. Pt has had 21 lb weight gain in 1 week.  Pt denies H/A or visual disturbances. Rpt C/S scheduled on 03/28/15 - desires BTS.  Breastfeeding tip of the week reviewed.

## 2015-03-27 ENCOUNTER — Encounter (HOSPITAL_COMMUNITY)
Admission: RE | Admit: 2015-03-27 | Discharge: 2015-03-27 | Disposition: A | Discharge status: Home / Self Care | Payer: 59 | Source: Ambulatory Visit | Attending: Obstetrics and Gynecology | Admitting: Obstetrics and Gynecology

## 2015-03-27 ENCOUNTER — Encounter (HOSPITAL_COMMUNITY): Payer: Self-pay

## 2015-03-27 HISTORY — DX: Nausea with vomiting, unspecified: Z98.890

## 2015-03-27 HISTORY — DX: Personal history of other diseases of the digestive system: Z87.19

## 2015-03-27 HISTORY — DX: Anemia, unspecified: D64.9

## 2015-03-27 HISTORY — DX: Nausea with vomiting, unspecified: R11.2

## 2015-03-27 LAB — CBC
HEMATOCRIT: 38.5 % (ref 36.0–46.0)
HEMOGLOBIN: 13.7 g/dL (ref 12.0–15.0)
MCH: 29.4 pg (ref 26.0–34.0)
MCHC: 35.6 g/dL (ref 30.0–36.0)
MCV: 82.6 fL (ref 78.0–100.0)
Platelets: 147 10*3/uL — ABNORMAL LOW (ref 150–400)
RBC: 4.66 MIL/uL (ref 3.87–5.11)
RDW: 13 % (ref 11.5–15.5)
WBC: 8.7 10*3/uL (ref 4.0–10.5)

## 2015-03-27 LAB — BASIC METABOLIC PANEL
Anion gap: 3 — ABNORMAL LOW (ref 5–15)
BUN: 19 mg/dL (ref 6–20)
CHLORIDE: 112 mmol/L — AB (ref 101–111)
CO2: 20 mmol/L — AB (ref 22–32)
Calcium: 7.8 mg/dL — ABNORMAL LOW (ref 8.9–10.3)
Creatinine, Ser: 0.91 mg/dL (ref 0.44–1.00)
GFR calc Af Amer: >60 mL/min (ref >60)
GFR calc non Af Amer: >60 mL/min (ref >60)
GLUCOSE: 47 mg/dL — AB (ref 65–99)
POTASSIUM: 4.4 mmol/L (ref 3.5–5.1)
SODIUM: 135 mmol/L (ref 135–145)

## 2015-03-27 MED ORDER — DEXTROSE 5 % IV SOLN
3.0000 g | INTRAVENOUS | Status: AC
  Administered 2015-03-28: 3 g via INTRAVENOUS
  Filled 2015-03-27: qty 3000

## 2015-03-27 NOTE — Pre-Procedure Instructions (Signed)
Dr. Hart RochesterHollis made aware of low platelets and glucose ordered to repeat CBC day of surgery and no insulin day of surgery.

## 2015-03-27 NOTE — Patient Instructions (Signed)
Your procedure is scheduled on:  Wednesday, NOV. 2, 2016  Enter through the [facility] at:  9:45 A.M.  Pick up the phone at the desk and dial [phone].  Call this number if you have problems the morning of surgery: [phone].  Remember: Do NOT eat food or drink after: Midnight tonight Take these medicines the morning of surgery with a SIP OF WATER: Nifedipine  Do NOT wear jewelry (body piercing), metal hair clips/bobby pins, or nail polish. Do NOT wear lotions, powders, or perfumes.  You may wear deoderant. Do NOT shave for 48 hours prior to surgery. Do NOT bring valuables to the hospital. Leave suitcase in car.  After surgery it may be brought to your room.  For patients admitted to the hospital, checkout time is 11:00 AM the day of discharge.

## 2015-03-28 ENCOUNTER — Inpatient Hospital Stay (HOSPITAL_COMMUNITY)
Admission: RE | Admit: 2015-03-28 | Discharge: 2015-03-30 | DRG: 765 | Disposition: A | Discharge status: Home / Self Care | Payer: 59 | Source: Ambulatory Visit | Attending: Obstetrics and Gynecology | Admitting: Obstetrics and Gynecology

## 2015-03-28 ENCOUNTER — Encounter: Payer: Self-pay | Admitting: *Deleted

## 2015-03-28 ENCOUNTER — Encounter (HOSPITAL_COMMUNITY): Payer: Self-pay | Admitting: *Deleted

## 2015-03-28 ENCOUNTER — Inpatient Hospital Stay (HOSPITAL_COMMUNITY): Payer: 59 | Admitting: Anesthesiology

## 2015-03-28 ENCOUNTER — Encounter (HOSPITAL_COMMUNITY)
Admission: RE | Disposition: A | Discharge status: Home / Self Care | Payer: Self-pay | Source: Ambulatory Visit | Attending: Obstetrics and Gynecology

## 2015-03-28 DIAGNOSIS — O34211 Maternal care for low transverse scar from previous cesarean delivery: Principal | ICD-10-CM | POA: Diagnosis present

## 2015-03-28 DIAGNOSIS — O99844 Bariatric surgery status complicating childbirth: Secondary | ICD-10-CM | POA: Diagnosis present

## 2015-03-28 DIAGNOSIS — O9902 Anemia complicating childbirth: Secondary | ICD-10-CM | POA: Diagnosis present

## 2015-03-28 DIAGNOSIS — Z794 Long term (current) use of insulin: Secondary | ICD-10-CM

## 2015-03-28 DIAGNOSIS — J9589 Other postprocedural complications and disorders of respiratory system, not elsewhere classified: Secondary | ICD-10-CM | POA: Diagnosis not present

## 2015-03-28 DIAGNOSIS — O09523 Supervision of elderly multigravida, third trimester: Secondary | ICD-10-CM

## 2015-03-28 DIAGNOSIS — I10 Essential (primary) hypertension: Secondary | ICD-10-CM | POA: Diagnosis present

## 2015-03-28 DIAGNOSIS — O99214 Obesity complicating childbirth: Secondary | ICD-10-CM | POA: Diagnosis present

## 2015-03-28 DIAGNOSIS — O2402 Pre-existing diabetes mellitus, type 1, in childbirth: Secondary | ICD-10-CM | POA: Diagnosis present

## 2015-03-28 DIAGNOSIS — Z6841 Body Mass Index (BMI) 40.0 and over, adult: Secondary | ICD-10-CM

## 2015-03-28 DIAGNOSIS — O9953 Diseases of the respiratory system complicating the puerperium: Secondary | ICD-10-CM | POA: Diagnosis not present

## 2015-03-28 DIAGNOSIS — O1204 Gestational edema, complicating childbirth: Secondary | ICD-10-CM | POA: Diagnosis present

## 2015-03-28 DIAGNOSIS — E10649 Type 1 diabetes mellitus with hypoglycemia without coma: Secondary | ICD-10-CM | POA: Diagnosis present

## 2015-03-28 DIAGNOSIS — O1092 Unspecified pre-existing hypertension complicating childbirth: Secondary | ICD-10-CM | POA: Diagnosis present

## 2015-03-28 DIAGNOSIS — D573 Sickle-cell trait: Secondary | ICD-10-CM | POA: Diagnosis present

## 2015-03-28 DIAGNOSIS — R0902 Hypoxemia: Secondary | ICD-10-CM | POA: Clinically undetermined

## 2015-03-28 DIAGNOSIS — Z3A39 39 weeks gestation of pregnancy: Secondary | ICD-10-CM

## 2015-03-28 DIAGNOSIS — R600 Localized edema: Secondary | ICD-10-CM | POA: Diagnosis not present

## 2015-03-28 DIAGNOSIS — R0681 Apnea, not elsewhere classified: Secondary | ICD-10-CM | POA: Diagnosis not present

## 2015-03-28 DIAGNOSIS — Z98891 History of uterine scar from previous surgery: Secondary | ICD-10-CM

## 2015-03-28 DIAGNOSIS — Z302 Encounter for sterilization: Secondary | ICD-10-CM | POA: Diagnosis not present

## 2015-03-28 DIAGNOSIS — R609 Edema, unspecified: Secondary | ICD-10-CM | POA: Diagnosis present

## 2015-03-28 DIAGNOSIS — Z3483 Encounter for supervision of other normal pregnancy, third trimester: Secondary | ICD-10-CM | POA: Diagnosis present

## 2015-03-28 DIAGNOSIS — E139 Other specified diabetes mellitus without complications: Secondary | ICD-10-CM | POA: Diagnosis present

## 2015-03-28 DIAGNOSIS — O34219 Maternal care for unspecified type scar from previous cesarean delivery: Secondary | ICD-10-CM

## 2015-03-28 DIAGNOSIS — E109 Type 1 diabetes mellitus without complications: Secondary | ICD-10-CM | POA: Diagnosis not present

## 2015-03-28 DIAGNOSIS — E669 Obesity, unspecified: Secondary | ICD-10-CM | POA: Diagnosis present

## 2015-03-28 LAB — GLUCOSE, CAPILLARY
GLUCOSE-CAPILLARY: 122 mg/dL — AB (ref 65–99)
GLUCOSE-CAPILLARY: 41 mg/dL — AB (ref 65–99)
GLUCOSE-CAPILLARY: 43 mg/dL — AB (ref 65–99)
GLUCOSE-CAPILLARY: 45 mg/dL — AB (ref 65–99)
GLUCOSE-CAPILLARY: 58 mg/dL — AB (ref 65–99)
GLUCOSE-CAPILLARY: 62 mg/dL — AB (ref 65–99)
GLUCOSE-CAPILLARY: 66 mg/dL (ref 65–99)
GLUCOSE-CAPILLARY: 87 mg/dL (ref 65–99)
Glucose-Capillary: 78 mg/dL (ref 65–99)
Glucose-Capillary: 45 mg/dL — ABNORMAL LOW (ref 65–99)
Glucose-Capillary: 61 mg/dL — ABNORMAL LOW (ref 65–99)
Glucose-Capillary: 122 mg/dL — ABNORMAL HIGH (ref 65–99)
Glucose-Capillary: 63 mg/dL — ABNORMAL LOW (ref 65–99)

## 2015-03-28 LAB — TYPE AND SCREEN
ABO/RH(D): A POS
Antibody Screen: NEGATIVE
UNIT DIVISION: 0
Unit division: 0

## 2015-03-28 LAB — PREPARE RBC (CROSSMATCH)

## 2015-03-28 LAB — CBC
HCT: 40.6 % (ref 36.0–46.0)
HEMOGLOBIN: 14.7 g/dL (ref 12.0–15.0)
MCH: 29.9 pg (ref 26.0–34.0)
MCHC: 36.2 g/dL — ABNORMAL HIGH (ref 30.0–36.0)
MCV: 82.7 fL (ref 78.0–100.0)
PLATELETS: 151 10*3/uL (ref 150–400)
RBC: 4.91 MIL/uL (ref 3.87–5.11)
RDW: 13.1 % (ref 11.5–15.5)
WBC: 10.2 10*3/uL (ref 4.0–10.5)

## 2015-03-28 LAB — RPR: RPR: NONREACTIVE

## 2015-03-28 SURGERY — Surgical Case
Anesthesia: Spinal | Site: Abdomen | Laterality: Bilateral

## 2015-03-28 MED ORDER — DIPHENHYDRAMINE HCL 25 MG PO CAPS
25.0000 mg | ORAL_CAPSULE | Freq: Four times a day (QID) | ORAL | Status: DC | PRN
  Filled 2015-03-28: qty 1

## 2015-03-28 MED ORDER — LACTATED RINGERS IV SOLN
INTRAVENOUS | Status: DC | PRN
  Administered 2015-03-28: 16:00:00 via INTRAVENOUS

## 2015-03-28 MED ORDER — MEPERIDINE HCL 25 MG/ML IJ SOLN: 6.2500 mg | INTRAMUSCULAR | Status: DC | PRN

## 2015-03-28 MED ORDER — ONDANSETRON HCL 4 MG/2ML IJ SOLN
INTRAMUSCULAR | Status: DC | PRN
  Administered 2015-03-28: 4 mg via INTRAVENOUS

## 2015-03-28 MED ORDER — SENNOSIDES-DOCUSATE SODIUM 8.6-50 MG PO TABS
2.0000 | ORAL_TABLET | ORAL | Status: DC
  Administered 2015-03-29 – 2015-03-30 (×2): 2 via ORAL
  Filled 2015-03-28 (×2): qty 2

## 2015-03-28 MED ORDER — LACTATED RINGERS IV SOLN
INTRAVENOUS | Status: DC
  Administered 2015-03-28: 21:00:00 via INTRAVENOUS

## 2015-03-28 MED ORDER — CITRIC ACID-SODIUM CITRATE 334-500 MG/5ML PO SOLN
30.0000 mL | Freq: Once | ORAL | Status: AC
  Administered 2015-03-28: 30 mL via ORAL

## 2015-03-28 MED ORDER — OXYTOCIN 10 UNIT/ML IJ SOLN
INTRAMUSCULAR | Status: AC
  Filled 2015-03-28: qty 4

## 2015-03-28 MED ORDER — DIPHENHYDRAMINE HCL 50 MG/ML IJ SOLN: 12.5000 mg | INTRAMUSCULAR | Status: DC | PRN

## 2015-03-28 MED ORDER — DEXTROSE 50 % IV SOLN
INTRAVENOUS | Status: AC
  Administered 2015-03-28: 25 mL via INTRAVENOUS
  Filled 2015-03-28: qty 50

## 2015-03-28 MED ORDER — EPHEDRINE 5 MG/ML INJ
INTRAVENOUS | Status: AC
  Filled 2015-03-28: qty 10

## 2015-03-28 MED ORDER — INSULIN ASPART 100 UNIT/ML ~~LOC~~ SOLN: 6.0000 [IU] | Freq: Three times a day (TID) | SUBCUTANEOUS | Status: DC

## 2015-03-28 MED ORDER — DEXTROSE 50 % IV SOLN
1.0000 | Freq: Once | INTRAVENOUS | Status: AC
  Administered 2015-03-28: 50 mL via INTRAVENOUS

## 2015-03-28 MED ORDER — LANOLIN HYDROUS EX OINT: 1.0000 "application " | TOPICAL_OINTMENT | CUTANEOUS | Status: DC | PRN

## 2015-03-28 MED ORDER — DEXTROSE 50 % IV SOLN
25.0000 mL | INTRAVENOUS | Status: DC | PRN
  Administered 2015-03-28 (×4): 25 mL via INTRAVENOUS

## 2015-03-28 MED ORDER — INSULIN ASPART PROT & ASPART (70-30 MIX) 100 UNIT/ML ~~LOC~~ SUSP
12.0000 [IU] | Freq: Every day | SUBCUTANEOUS | Status: DC
  Filled 2015-03-28: qty 10

## 2015-03-28 MED ORDER — NALBUPHINE HCL 10 MG/ML IJ SOLN: 5.0000 mg | INTRAMUSCULAR | Status: DC | PRN

## 2015-03-28 MED ORDER — OXYCODONE-ACETAMINOPHEN 5-325 MG PO TABS
2.0000 | ORAL_TABLET | ORAL | Status: DC | PRN
  Administered 2015-03-28 – 2015-03-30 (×2): 2 via ORAL
  Filled 2015-03-28 (×2): qty 2

## 2015-03-28 MED ORDER — INSULIN NPH (HUMAN) (ISOPHANE) 100 UNIT/ML ~~LOC~~ SUSP
6.0000 [IU] | Freq: Two times a day (BID) | SUBCUTANEOUS | Status: DC
  Filled 2015-03-28: qty 10

## 2015-03-28 MED ORDER — DEXTROSE 50 % IV SOLN
INTRAVENOUS | Status: AC
Start: 2015-03-28 — End: 2015-03-28
  Administered 2015-03-28: 25 mL via INTRAVENOUS
  Filled 2015-03-28: qty 50

## 2015-03-28 MED ORDER — PHENYLEPHRINE 8 MG IN D5W 100 ML (0.08MG/ML) PREMIX OPTIME
INJECTION | INTRAVENOUS | Status: AC
  Filled 2015-03-28: qty 100

## 2015-03-28 MED ORDER — DIPHENHYDRAMINE HCL 25 MG PO CAPS
25.0000 mg | ORAL_CAPSULE | ORAL | Status: DC | PRN
  Administered 2015-03-29: 25 mg via ORAL
  Filled 2015-03-28: qty 1

## 2015-03-28 MED ORDER — NALBUPHINE HCL 10 MG/ML IJ SOLN: 5.0000 mg | Freq: Once | INTRAMUSCULAR | Status: DC | PRN

## 2015-03-28 MED ORDER — FENTANYL CITRATE (PF) 100 MCG/2ML IJ SOLN
INTRAMUSCULAR | Status: AC
  Filled 2015-03-28: qty 4

## 2015-03-28 MED ORDER — SIMETHICONE 80 MG PO CHEW
80.0000 mg | CHEWABLE_TABLET | Freq: Three times a day (TID) | ORAL | Status: DC
Start: 2015-03-28 — End: 2015-03-30
  Administered 2015-03-28 – 2015-03-30 (×6): 80 mg via ORAL
  Filled 2015-03-28 (×5): qty 1

## 2015-03-28 MED ORDER — HYDROCHLOROTHIAZIDE 25 MG PO TABS
25.0000 mg | ORAL_TABLET | Freq: Every day | ORAL | Status: DC
  Administered 2015-03-28 – 2015-03-30 (×3): 25 mg via ORAL
  Filled 2015-03-28 (×4): qty 1

## 2015-03-28 MED ORDER — ACETAMINOPHEN 325 MG PO TABS: 650.0000 mg | ORAL_TABLET | ORAL | Status: DC | PRN

## 2015-03-28 MED ORDER — INSULIN ASPART PROT & ASPART (70-30 MIX) 100 UNIT/ML PEN: 6.0000 [IU] | PEN_INJECTOR | Freq: Every day | SUBCUTANEOUS | Status: DC

## 2015-03-28 MED ORDER — OXYTOCIN 10 UNIT/ML IJ SOLN
40.0000 [IU] | INTRAVENOUS | Status: DC | PRN
  Administered 2015-03-28: 40 [IU] via INTRAVENOUS

## 2015-03-28 MED ORDER — FUROSEMIDE 10 MG/ML IJ SOLN
INTRAMUSCULAR | Status: DC | PRN
  Administered 2015-03-28: 20 mg via INTRAMUSCULAR

## 2015-03-28 MED ORDER — FUROSEMIDE 10 MG/ML IJ SOLN
20.0000 mg | Freq: Once | INTRAMUSCULAR | Status: AC
  Administered 2015-03-28: 20 mg via INTRAVENOUS
  Filled 2015-03-28: qty 2

## 2015-03-28 MED ORDER — SCOPOLAMINE 1 MG/3DAYS TD PT72
1.0000 | MEDICATED_PATCH | Freq: Once | TRANSDERMAL | Status: DC
  Administered 2015-03-28: 1.5 mg via TRANSDERMAL

## 2015-03-28 MED ORDER — TETANUS-DIPHTH-ACELL PERTUSSIS 5-2.5-18.5 LF-MCG/0.5 IM SUSP
0.5000 mL | Freq: Once | INTRAMUSCULAR | Status: DC
  Filled 2015-03-28: qty 0.5

## 2015-03-28 MED ORDER — KETOROLAC TROMETHAMINE 30 MG/ML IJ SOLN: 30.0000 mg | Freq: Four times a day (QID) | INTRAMUSCULAR | Status: AC | PRN

## 2015-03-28 MED ORDER — SODIUM CHLORIDE 0.9 % IJ SOLN: 3.0000 mL | INTRAMUSCULAR | Status: DC | PRN

## 2015-03-28 MED ORDER — WITCH HAZEL-GLYCERIN EX PADS: 1.0000 "application " | MEDICATED_PAD | CUTANEOUS | Status: DC | PRN

## 2015-03-28 MED ORDER — BUPIVACAINE IN DEXTROSE 0.75-8.25 % IT SOLN
INTRATHECAL | Status: DC | PRN
  Administered 2015-03-28: 1.6 mL via INTRATHECAL

## 2015-03-28 MED ORDER — MORPHINE SULFATE (PF) 0.5 MG/ML IJ SOLN
INTRAMUSCULAR | Status: AC
  Filled 2015-03-28: qty 100

## 2015-03-28 MED ORDER — ACETAMINOPHEN 500 MG PO TABS
1000.0000 mg | ORAL_TABLET | Freq: Four times a day (QID) | ORAL | Status: AC
  Administered 2015-03-29 (×3): 1000 mg via ORAL
  Filled 2015-03-28 (×3): qty 2

## 2015-03-28 MED ORDER — NALOXONE HCL 0.4 MG/ML IJ SOLN: 0.4000 mg | INTRAMUSCULAR | Status: DC | PRN

## 2015-03-28 MED ORDER — NALOXONE HCL 2 MG/2ML IJ SOSY
1.0000 ug/kg/h | PREFILLED_SYRINGE | INTRAMUSCULAR | Status: DC | PRN
Start: 2015-03-28 — End: 2015-03-30
  Filled 2015-03-28: qty 2

## 2015-03-28 MED ORDER — OXYCODONE-ACETAMINOPHEN 5-325 MG PO TABS: 1.0000 | ORAL_TABLET | ORAL | Status: DC | PRN

## 2015-03-28 MED ORDER — ONDANSETRON HCL 4 MG/2ML IJ SOLN
INTRAMUSCULAR | Status: AC
  Filled 2015-03-28: qty 2

## 2015-03-28 MED ORDER — DEXTROSE 50 % IV SOLN
25.0000 mL | Freq: Once | INTRAVENOUS | Status: AC
  Administered 2015-03-28: 25 mL via INTRAVENOUS

## 2015-03-28 MED ORDER — EPHEDRINE SULFATE 50 MG/ML IJ SOLN
INTRAMUSCULAR | Status: DC | PRN
  Administered 2015-03-28: 10 mg via INTRAVENOUS

## 2015-03-28 MED ORDER — PRENATAL MULTIVITAMIN CH
1.0000 | ORAL_TABLET | Freq: Every day | ORAL | Status: DC
  Administered 2015-03-29 – 2015-03-30 (×2): 1 via ORAL
  Filled 2015-03-28 (×2): qty 1

## 2015-03-28 MED ORDER — SCOPOLAMINE 1 MG/3DAYS TD PT72
MEDICATED_PATCH | TRANSDERMAL | Status: AC
  Administered 2015-03-28: 1.5 mg via TRANSDERMAL
  Filled 2015-03-28: qty 1

## 2015-03-28 MED ORDER — OXYTOCIN 40 UNITS IN LACTATED RINGERS INFUSION - SIMPLE MED: 62.5000 mL/h | INTRAVENOUS | Status: AC

## 2015-03-28 MED ORDER — FUROSEMIDE 10 MG/ML IJ SOLN
INTRAMUSCULAR | Status: AC
  Filled 2015-03-28: qty 2

## 2015-03-28 MED ORDER — FENTANYL CITRATE (PF) 100 MCG/2ML IJ SOLN
INTRAMUSCULAR | Status: DC | PRN
  Administered 2015-03-28: 10 ug via INTRATHECAL

## 2015-03-28 MED ORDER — SIMETHICONE 80 MG PO CHEW
80.0000 mg | CHEWABLE_TABLET | ORAL | Status: DC | PRN
  Filled 2015-03-28: qty 1

## 2015-03-28 MED ORDER — IBUPROFEN 600 MG PO TABS
600.0000 mg | ORAL_TABLET | Freq: Four times a day (QID) | ORAL | Status: DC
  Administered 2015-03-28 – 2015-03-30 (×7): 600 mg via ORAL
  Filled 2015-03-28 (×7): qty 1

## 2015-03-28 MED ORDER — SIMETHICONE 80 MG PO CHEW
80.0000 mg | CHEWABLE_TABLET | ORAL | Status: DC
  Administered 2015-03-29 – 2015-03-30 (×2): 80 mg via ORAL
  Filled 2015-03-28 (×2): qty 1

## 2015-03-28 MED ORDER — DIBUCAINE 1 % RE OINT: 1.0000 "application " | TOPICAL_OINTMENT | RECTAL | Status: DC | PRN

## 2015-03-28 MED ORDER — PHENYLEPHRINE 8 MG IN D5W 100 ML (0.08MG/ML) PREMIX OPTIME
INJECTION | INTRAVENOUS | Status: DC | PRN
  Administered 2015-03-28: 30 ug/min via INTRAVENOUS

## 2015-03-28 MED ORDER — DEXTROSE 50 % IV SOLN
INTRAVENOUS | Status: AC
  Filled 2015-03-28: qty 50

## 2015-03-28 MED ORDER — LACTATED RINGERS IV SOLN
INTRAVENOUS | Status: DC
  Administered 2015-03-28: 15:00:00 via INTRAVENOUS
  Administered 2015-03-28 (×2): 125 mL/h via INTRAVENOUS

## 2015-03-28 MED ORDER — MORPHINE SULFATE (PF) 0.5 MG/ML IJ SOLN
INTRAMUSCULAR | Status: DC | PRN
  Administered 2015-03-28: .1 mg via INTRATHECAL

## 2015-03-28 MED ORDER — MENTHOL 3 MG MT LOZG: 1.0000 | LOZENGE | OROMUCOSAL | Status: DC | PRN

## 2015-03-28 MED ORDER — 0.9 % SODIUM CHLORIDE (POUR BTL) OPTIME
TOPICAL | Status: DC | PRN
  Administered 2015-03-28: 1000 mL

## 2015-03-28 MED ORDER — ONDANSETRON HCL 4 MG/2ML IJ SOLN: 4.0000 mg | Freq: Three times a day (TID) | INTRAMUSCULAR | Status: DC | PRN

## 2015-03-28 MED ORDER — CITRIC ACID-SODIUM CITRATE 334-500 MG/5ML PO SOLN
ORAL | Status: AC
  Filled 2015-03-28: qty 15

## 2015-03-28 SURGICAL SUPPLY — 37 items
APL SKNCLS STERI-STRIP NONHPOA (GAUZE/BANDAGES/DRESSINGS) ×1
BENZOIN TINCTURE PRP APPL 2/3 (GAUZE/BANDAGES/DRESSINGS) ×3 IMPLANT
BRR ADH 6X5 SEPRAFILM 1 SHT (MISCELLANEOUS)
CLAMP CORD UMBIL (MISCELLANEOUS) IMPLANT
CLOSURE WOUND 1/2 X4 (GAUZE/BANDAGES/DRESSINGS) ×1
CONTAINER PREFILL 10% NBF 15ML (MISCELLANEOUS) IMPLANT
DRAPE SHEET LG 3/4 BI-LAMINATE (DRAPES) IMPLANT
DRESSING DISP NPWT PICO 4X12 (MISCELLANEOUS) ×3 IMPLANT
DRSG OPSITE POSTOP 4X10 (GAUZE/BANDAGES/DRESSINGS) ×3 IMPLANT
DURAPREP 26ML APPLICATOR (WOUND CARE) ×3 IMPLANT
ELECT REM PT RETURN 9FT ADLT (ELECTROSURGICAL) ×3
ELECTRODE REM PT RTRN 9FT ADLT (ELECTROSURGICAL) ×1 IMPLANT
EXTRACTOR VACUUM M CUP 4 TUBE (SUCTIONS) ×2 IMPLANT
EXTRACTOR VACUUM M CUP 4' TUBE (SUCTIONS) ×1
GLOVE BIOGEL PI IND STRL 6.5 (GLOVE) ×1 IMPLANT
GLOVE BIOGEL PI INDICATOR 6.5 (GLOVE) ×2
GLOVE SURG SS PI 6.0 STRL IVOR (GLOVE) ×3 IMPLANT
GOWN STRL REUS W/TWL LRG LVL3 (GOWN DISPOSABLE) ×6 IMPLANT
KIT ABG SYR 3ML LUER SLIP (SYRINGE) IMPLANT
NEEDLE HYPO 25X5/8 SAFETYGLIDE (NEEDLE) IMPLANT
NS IRRIG 1000ML POUR BTL (IV SOLUTION) ×3 IMPLANT
PACK C SECTION WH (CUSTOM PROCEDURE TRAY) ×3 IMPLANT
PAD OB MATERNITY 4.3X12.25 (PERSONAL CARE ITEMS) ×3 IMPLANT
PENCIL SMOKE EVAC W/HOLSTER (ELECTROSURGICAL) ×3 IMPLANT
RETAINER VISCERAL (MISCELLANEOUS) ×3 IMPLANT
RETRACTOR TRAXI PANNICULUS (MISCELLANEOUS) ×1 IMPLANT
RTRCTR C-SECT PINK 25CM LRG (MISCELLANEOUS) IMPLANT
SEPRAFILM MEMBRANE 5X6 (MISCELLANEOUS) IMPLANT
SPONGE LAP 18X18 X RAY DECT (DISPOSABLE) ×3 IMPLANT
STRIP CLOSURE SKIN 1/2X4 (GAUZE/BANDAGES/DRESSINGS) ×2 IMPLANT
SUT PLAIN 0 NONE (SUTURE) ×3 IMPLANT
SUT PLAIN 2 0 XLH (SUTURE) ×3 IMPLANT
SUT VIC AB 0 CT1 36 (SUTURE) ×15 IMPLANT
SUT VIC AB 4-0 KS 27 (SUTURE) ×3 IMPLANT
TOWEL OR 17X24 6PK STRL BLUE (TOWEL DISPOSABLE) ×3 IMPLANT
TRAXI PANNICULUS RETRACTOR (MISCELLANEOUS) ×2
TRAY FOLEY CATH SILVER 14FR (SET/KITS/TRAYS/PACK) ×3 IMPLANT

## 2015-03-28 NOTE — Progress Notes (Signed)
This note also relates to the following rows which could not be included: CBG Lab Component - View only - Cannot attach notes to extension rows     03/28/15 2121  Vitals  BP (!) 166/95 mmHg  MAP (mmHg) 116  BP Location Right Arm  BP Method Automatic  Patient Position (if appropriate) Sitting  Pulse Rate 91  Pulse Rate Source Dinamap  Resp 20  Oxygen Therapy  SpO2 99 %  End [place] CO2 (EtCO2) 31  We will give scheduled Hydrochlothiazide 25 mg po and continue to monitor. CBG 63 mg/dl. No s/s of hypoglycemia. Pt ate  As per protocol, and awaiting supper.

## 2015-03-28 NOTE — Transfer of Care (Signed)
Immediate Anesthesia Transfer of Care Note  Patient: Melissa Braun  Procedure(s) Performed: Procedure(s): CESAREAN SECTION WITH BILATERAL TUBAL LIGATION (Bilateral)  Patient Location: PACU  Anesthesia Type:Spinal  Level of Consciousness: awake, alert  and oriented  Airway & Oxygen Therapy: Patient Spontanous Breathing  Post-op Assessment: Report given to RN and Post -op Vital signs reviewed and stable  Post vital signs: Reviewed and stable  Last Vitals:  Filed Vitals:   03/28/15 1226  BP: 158/90  Pulse: 96  Temp: 37 C  Resp: 15    Complications: No apparent anesthesia complications
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
## 2015-03-28 NOTE — Op Note (Signed)
Melissa Braun PROCEDURE DATE: 03/28/2015  PREOPERATIVE DIAGNOSIS: Intrauterine pregnancy at  6669w0d weeks gestation; previous uterine incision kerr x2 and desire for permanent sterilization  POSTOPERATIVE DIAGNOSIS: The same  PROCEDURE:     Cesarean Section and Bilateral Tubal Sterilization using Pomeroy method   SURGEON:  Dr. Catalina AntiguaPeggy Daltin Crist  ASSISTANT: Dr. Lyndel SafeKimberly Newton  INDICATIONS: Melissa ChimeShannon F Dayrit is a 39 y.o. [id number] at 7369w0d scheduled for cesarean section secondary to previous uterine incision kerr x2.  The risks of cesarean section discussed with the patient included but were not limited to: bleeding which may require transfusion or reoperation; infection which may require antibiotics; injury to bowel, bladder, ureters or other surrounding organs; injury to the fetus; need for additional procedures including hysterectomy in the event of a life-threatening hemorrhage; placental abnormalities wth subsequent pregnancies, incisional problems, thromboembolic phenomenon and other postoperative/anesthesia complications. The patient concurred with the proposed plan, giving informed written consent for the procedure.  Patient also desires permanent sterilization. Risks and benefits of procedure discussed with patient including permanence of method, bleeding, infection, injury to surrounding organs and need for additional procedures. Risk failure of 0.5-1% with increased risk of ectopic gestation if pregnancy occurs was also discussed with patient.  FINDINGS:  Viable female infant in cephalic presentation.  Apgars 8 and 9.  Clear amniotic fluid.  Intact placenta, three vessel cord.  Normal uterus, fallopian tubes and ovaries bilaterally.  ANESTHESIA:    Spinal INTRAVENOUS FLUIDS:1800 ml ESTIMATED BLOOD LOSS: 900 ml URINE OUTPUT:  200 ml SPECIMENS: Placenta sent L&D, portions of left and right fallopian tubes sent to pathology/ COMPLICATIONS: None immediate  PROCEDURE IN DETAIL:  The patient  received intravenous antibiotics and had sequential compression devices applied to her lower extremities while in the preoperative area.  She was then taken to the operating room where anesthesia was induced and was found to be adequate. A foley catheter was placed into her bladder and attached to B[place]on [place]water gravity. She was then placed in a dorsal supine position with a leftward tilt, and prepped and draped in a sterile manner. After an adequate timeout was performed, a Pfannenstiel skin incision was made with scalpel and carried through to the underlying layer of fascia. The fascia was incised in the midline and this incision was extended bilaterally using the Mayo scissors. Kocher clamps were applied to the superior aspect of the fascial incision and the underlying rectus muscles were dissected off bluntly. A similar process was carried out on the inferior aspect of the facial incision. The rectus muscles were separated in the midline bluntly and the peritoneum was entered bluntly. The Alexis self-retaining retractor was introduced into the abdominal cavity. Attention was turned to the lower uterine segment where a transverse hysterotomy was made with a scalpel and extended bilaterally bluntly. The infant was successfully delivered, and cord was clamped after 1 minute and cut and infant was handed over to awaiting neonatology team. Uterine massage was then administered and the placenta delivered intact with three-vessel cord. The uterus was cleared of clot and debris.  The hysterotomy was closed with 0 Vicryl in a running locked fashion, and an imbricating layer was also placed with a 0 Vicryl. Overall, excellent hemostasis was noted. The pelvis copiously irrigated and cleared of all clot and debris. The patient's left fallopian tube was then identified, brought to the incision, and grasped with a Babcock clamp. The tube was then followed out to the fimbria. The Babcock clamp was then used to grasp the tube  approximately  4 cm from the cornual region. A 3 cm segment of the tube was then ligated with free tie of plain gut suture, transected and excised. Good hemostasis was noted and the tube was returned to the abdomen. The right fallopian tube was then identified to its fimbriated end, ligated, and a 3 cm segment excised in a similar fashion. Excellent hemostasis was noted, and the tube returned to the abdomen.Hemostasis was confirmed on all surfaces.  The peritoneum and the muscles were reapproximated using 0 vicryl interrupted stitches. The fascia was then closed using 0 Vicryl in a running fashion.  The subcutaneous layer was reapproximated with plain gut and the skin was closed in a subcuticular fashion using 3.0 Vicryl. A PICO wound vac was applied over the incision. The patient tolerated the procedure well. Sponge, lap, instrument and needle counts were correct x 2. She was taken to the recovery room in stable condition.    Selicia Windom,PEGGYMD  03/28/2015 4:36 PM

## 2015-03-28 NOTE — Anesthesia Preprocedure Evaluation (Addendum)
Anesthesia Evaluation  Patient identified by MRN, date of birth, ID band Patient awake    Reviewed: Allergy & Precautions, NPO status , Patient's Chart, lab work & pertinent test results  History of Anesthesia Complications (+) PONV and history of anesthetic complications  Airway Mallampati: III  TM Distance: >3 FB Neck ROM: Full    Dental  (+) Teeth Intact, Dental Advisory Given   Pulmonary neg pulmonary ROS,    Pulmonary exam normal breath sounds clear to auscultation       Cardiovascular hypertension, Pt. on medications (-) angina(-) Past MI Normal cardiovascular exam Rhythm:Regular Rate:Normal     Neuro/Psych negative neurological ROS     GI/Hepatic Neg liver ROS, S/p gastric bypass    Endo/Other  diabetes, Type 1, Insulin DependentSuper morbid obesity; BMI >50 IDDM--episodes of hypoglycemia this pregnancy  Renal/GU negative Renal ROS     Musculoskeletal negative musculoskeletal ROS (+)   Abdominal (+) + obese,   Peds  Hematology  (+) Blood dyscrasia, Sickle cell trait and anemia ,   Anesthesia Other Findings Day of surgery medications reviewed with the patient.  Last PO intake 0630am.  Will wait 8 hours before proceeding.  Reproductive/Obstetrics (+) Pregnancy                           Anesthesia Physical Anesthesia Plan  ASA: III  Anesthesia Plan: Combined Spinal and Epidural   Post-op Pain Management:    Induction:   Airway Management Planned: Nasal Cannula  Additional Equipment:   Intra-op Plan:   Post-operative Plan:   Informed Consent: I have reviewed the patients History and Physical, chart, labs and discussed the procedure including the risks, benefits and alternatives for the proposed anesthesia with the patient or authorized representative who has indicated his/her understanding and acceptance.   Dental advisory given  Plan Discussed with: CRNA,  Anesthesiologist and Surgeon  Anesthesia Plan Comments: (Discussed risks and benefits of and differences between spinal and general. Discussed risks of spinal including headache, backache, failure, bleeding, infection, and nerve damage. Patient consents to spinal. Questions answered. Coagulation studies and platelet count acceptable.)        Anesthesia Quick Evaluation

## 2015-03-28 NOTE — Anesthesia Postprocedure Evaluation (Signed)
  Anesthesia Post-op Note  Patient: Melissa Braun  Procedure(s) Performed: Procedure(s): CESAREAN SECTION WITH BILATERAL TUBAL LIGATION (Bilateral)  Patient Location: PACU  Anesthesia Type:Spinal  Level of Consciousness: awake, alert , oriented and patient cooperative  Airway and Oxygen Therapy: Patient connected to nasal cannula oxygen  Post-op Pain: mild  Post-op Assessment: Post-op Vital signs reviewed, Patient's Cardiovascular Status Stable, Patent Airway, No signs of Nausea or vomiting, Pain level controlled, No headache, No backache, Spinal receding and Patient able to bend at knees              Post-op Vital Signs: Reviewed   --Patient having short apneic spells in PACU following C-section.  EtCO2 monitoring and pulse oximetry applied.  EtCO2 stable in 30s; SpO2 >95% but with continued apneic spells when sleeping.  When awake, patient is alert, oriented and cooperative with staff.  After conferring with OB team, decision made to transfer patient to ICU where EtCO2 and pulse oximetry monitoring can be continued for 24 hours due to patient receiving intrathecal morphine.--  Last Vitals:  Filed Vitals:   03/28/15 1856  BP:   Pulse: 93  Temp:   Resp: 15    Complications: No apparent anesthesia complications

## 2015-03-28 NOTE — Anesthesia Procedure Notes (Signed)
Epidural Patient location during procedure: OR  Staffing Anesthesiologist: Cecile HearingURK, Journii Nierman EDWARD Performed by: anesthesiologist   Preanesthetic Checklist Completed: patient identified, pre-op evaluation, timeout performed, IV checked, risks and benefits discussed and monitors and equipment checked  Epidural Patient position: sitting Prep: DuraPrep Patient monitoring: blood pressure and continuous pulse ox Approach: midline Location: L3-L4 Injection technique: LOR air  Needle:  Needle type: Tuohy  Needle gauge: 17 G Needle length: 9 cm Needle insertion depth: 8 cm Catheter type: closed end flexible Catheter size: 19 Gauge Catheter at skin depth: 13 cm Test dose: Other (Combined spinal epidural; +CSF return with spinal needle; spinal bupivicaine given; epidural catheter threaded easily and secured with tape)  Assessment Sensory level: T4  Additional Notes Patient identified.  Risk benefits discussed including failed block, incomplete pain control, headache, nerve damage, paralysis, blood pressure changes, nausea, vomiting, reactions to medication both toxic or allergic, and postpartum back pain.  Patient expressed understanding and wished to proceed.  All questions were answered.  Sterile technique used throughout procedure and epidural site dressed with sterile barrier dressing. No paresthesia or other complications noted. The patient did not experience any signs of intravascular injection such as tinnitus or metallic taste in mouth nor signs of intrathecal spread such as rapid motor block. Please see nursing notes for vital signs.  --Combined spinal epidural with epidural catheter left in situ.  LOR with touhy needle, +clear csf return via spinal needle. Spinal anesthetic injected easily. Epidural catheter then threaded 5cm into epidural space and secured with sterile tegaderm and tape.  Bilateral T4 level obtained to pinprick sensation.Reason for block:surgical anesthesia

## 2015-03-29 ENCOUNTER — Inpatient Hospital Stay (HOSPITAL_COMMUNITY): Payer: 59

## 2015-03-29 ENCOUNTER — Encounter (HOSPITAL_COMMUNITY): Payer: Self-pay | Admitting: Obstetrics and Gynecology

## 2015-03-29 DIAGNOSIS — R600 Localized edema: Secondary | ICD-10-CM

## 2015-03-29 LAB — COMPREHENSIVE METABOLIC PANEL
ALBUMIN: 1.6 g/dL — AB (ref 3.5–5.0)
ALK PHOS: 77 U/L (ref 38–126)
ALT: 46 U/L (ref 14–54)
ANION GAP: 1 — AB (ref 5–15)
AST: 75 U/L — ABNORMAL HIGH (ref 15–41)
BILIRUBIN TOTAL: 0.3 mg/dL (ref 0.3–1.2)
BUN: 19 mg/dL (ref 6–20)
CALCIUM: 7.3 mg/dL — AB (ref 8.9–10.3)
CO2: 21 mmol/L — ABNORMAL LOW (ref 22–32)
Chloride: 111 mmol/L (ref 101–111)
Creatinine, Ser: 1.1 mg/dL — ABNORMAL HIGH (ref 0.44–1.00)
GFR calc non Af Amer: >60 mL/min (ref >60)
GLUCOSE: 86 mg/dL (ref 65–99)
POTASSIUM: 4.9 mmol/L (ref 3.5–5.1)
SODIUM: 133 mmol/L — AB (ref 135–145)
TOTAL PROTEIN: 3.7 g/dL — AB (ref 6.5–8.1)

## 2015-03-29 LAB — GLUCOSE, CAPILLARY
GLUCOSE-CAPILLARY: 91 mg/dL (ref 65–99)
GLUCOSE-CAPILLARY: 89 mg/dL (ref 65–99)
Glucose-Capillary: 38 mg/dL — CL (ref 65–99)
Glucose-Capillary: 80 mg/dL (ref 65–99)
Glucose-Capillary: 100 mg/dL — ABNORMAL HIGH (ref 65–99)

## 2015-03-29 LAB — CBC
HEMATOCRIT: 25.3 % — AB (ref 36.0–46.0)
HEMOGLOBIN: 9 g/dL — AB (ref 12.0–15.0)
MCH: 29.8 pg (ref 26.0–34.0)
MCHC: 35.6 g/dL (ref 30.0–36.0)
MCV: 83.8 fL (ref 78.0–100.0)
Platelets: 126 10*3/uL — ABNORMAL LOW (ref 150–400)
RBC: 3.02 MIL/uL — ABNORMAL LOW (ref 3.87–5.11)
RDW: 13.3 % (ref 11.5–15.5)
WBC: 13.5 10*3/uL — ABNORMAL HIGH (ref 4.0–10.5)

## 2015-03-29 LAB — BIRTH TISSUE RECOVERY COLLECTION (PLACENTA DONATION)

## 2015-03-29 MED ORDER — FUROSEMIDE 10 MG/ML IJ SOLN
20.0000 mg | Freq: Four times a day (QID) | INTRAMUSCULAR | Status: AC
  Administered 2015-03-29 (×2): 20 mg via INTRAVENOUS
  Filled 2015-03-29 (×2): qty 2

## 2015-03-29 MED ORDER — LABETALOL HCL 200 MG PO TABS
200.0000 mg | ORAL_TABLET | Freq: Once | ORAL | Status: AC
  Administered 2015-03-29: 200 mg via ORAL
  Filled 2015-03-29: qty 1

## 2015-03-29 MED ORDER — PNEUMOCOCCAL VAC POLYVALENT 25 MCG/0.5ML IJ INJ
0.5000 mL | INJECTION | INTRAMUSCULAR | Status: AC
  Administered 2015-03-30: 0.5 mL via INTRAMUSCULAR
  Filled 2015-03-29: qty 0.5

## 2015-03-29 MED ORDER — FUROSEMIDE 10 MG/ML IJ SOLN
40.0000 mg | Freq: Once | INTRAMUSCULAR | Status: AC
  Administered 2015-03-29: 40 mg via INTRAVENOUS
  Filled 2015-03-29: qty 4

## 2015-03-29 MED ORDER — INSULIN ASPART 100 UNIT/ML ~~LOC~~ SOLN
4.0000 [IU] | Freq: Three times a day (TID) | SUBCUTANEOUS | Status: DC
  Administered 2015-03-29 – 2015-03-30 (×5): 4 [IU] via SUBCUTANEOUS

## 2015-03-29 MED ORDER — LACTATED RINGERS IV SOLN
INTRAVENOUS | Status: DC
  Administered 2015-03-29 (×2): via INTRAVENOUS

## 2015-03-29 MED ORDER — INSULIN GLARGINE 100 UNIT/ML ~~LOC~~ SOLN
6.0000 [IU] | Freq: Every day | SUBCUTANEOUS | Status: DC
  Administered 2015-03-29: 6 [IU] via SUBCUTANEOUS
  Filled 2015-03-29 (×2): qty 0.06

## 2015-03-29 NOTE — Addendum Note (Signed)
Addendum  created 03/29/15 0831 by Cecile HearingStephen Edward Matai Carpenito, MD   Modules edited: Clinical Notes   Clinical Notes:  File: [id number]

## 2015-03-29 NOTE — Progress Notes (Signed)
Post-op Note: Patient reports she did well overnight. Tolerating PO intake, denies nausea/vomiting, denies pruritis.  Moving bilateral lower extremities.  Hgb dropped to 9 on post-op CBC, but VSS.  Plan to remove epidural this AM.  No episodes of apnea once admitted to ICU per ICU nurse.  Continues to be monitored on EtCO2 and pulse oximetry.  Arrie AranStephen Noah Lembke, MD Anesthesiology

## 2015-03-29 NOTE — Progress Notes (Signed)
  Echocardiogram 2D Echocardiogram has been performed.  Muhammed Teutsch 03/29/2015, 10:18 AM

## 2015-03-29 NOTE — Progress Notes (Signed)
Subjective: Postpartum Day 1: Cesarean Delivery Patient reports tolerating PO and no problems voiding.  Foley will need to be left in today due to pt's inability to move. Epidural catheter still in place, to be removed this a.m.  Objective: Vital signs in last 24 hours: Temp:  [97.7 F (36.5 C)-98.6 F (37 C)] 98.1 F (36.7 C) (11/03 0408) Pulse Rate:  [73-98] 77 (11/03 0628) Resp:  [6-34] 18 (11/03 0628) BP: (126-177)/(67-128) 137/67 mmHg (11/03 0628) SpO2:  [92 %-100 %] 100 % (11/03 0628) FiO2 (%):  [97 %] 97 % (11/02 1923) Weight:  [276 lb 9.6 oz (125.465 kg)] 276 lb 9.6 oz (125.465 kg) (11/02 1923)  Physical Exam:  General: alert, cooperative, morbidly obese and severe edema to umbilicus.4+ pitting Lochia: appropriate Uterine Fundus: hidden by pannus, bowel sounds active Incision: stable dry blood saturating 3/4 of PICO dressing DVT Evaluation: No evidence of DVT seen on physical exam. Calf/Ankle edema is present.pitting edema to thigh and pannus    Recent Labs  03/28/15 0925 03/29/15 0530  HGB 14.7 9.0*  HCT 40.6 25.3*   CBG (last 3)   Recent Labs  03/28/15 2121 03/28/15 2356 03/29/15 0545  GLUCAP 63* 122* 80     Assessment/Plan: Status post Cesarean section. Postoperative course complicated by unexpected anemia, perhaps underestimated intraop blood loss Will check cbc in a.m. 2. DM  Currently on 4 units Novolog q meal and 6 units Lantus HS (4+4+4 +6=18/day) 3. Hypertension: stable , only required a single dose labetalol 200 last pm, plus the lasix 20 x 3 4  .Massive edema, pt considers this unremarkable, but response to lasix has been minimal, will check echo.  Ashlon Lottman V 03/29/2015, 7:08 AM

## 2015-03-29 NOTE — Lactation Note (Signed)
This note was copied from the chart of Melissa Wallis MartShannon Willingham. Lactation Consultation Note  Patient Name: Melissa Braun WGNFA'OToday's Date: 03/29/2015 Reason for consult: Initial assessment  Baby 18 hours old. Mom reports that she did not nurse her older biological child, but she does remember her milk "coming in." Assisted mom to latch baby to left breast in football position. Baby sleepy at breast, and not wanting to open his mouth to nurse. Mom states that baby has latched earlier, but that she heard baby smacking and suckling loud at breast. Discussed with mom that baby not on deeply if she is hearing these noises. Elicited baby to suckled this LC's gloved finger, but baby still would not latch. Mom able to easily express colostrum. Demonstrated to parents how to spoon-feed colostrum. Baby tolerated well and cueing to nurse, so attempted to latch baby again and demonstrated positioning of pillows to bring baby closer to breast. However, mom needed to have procedure/ECG, so enc mom to latch baby again--STS--after procedure.   Enc parents to put baby to breast with cues, then supplement with EBM if baby not wanting to latch. Enc mom to call for assistance with latching and spoon-feeding as needed. Mom has her own DEBP in the room.  [facility], aware of OP/BFSG, community resources, and [facility] phone [facility] assistance after [facility]C.  Maternal Data Has patient been taught Hand Expression?: Yes Does the patient have breastfeeding experience prior to this delivery?: No  Feeding Feeding Type: Breast Fed Length of feed: 0 min  LATCH Score/Interventions Latch: Too sleepy or reluctant, no latch achieved, no sucking elicited. Intervention(s): Skin to skin;Teach feeding cues;Waking techniques Intervention(s): Adjust position;Assist with latch;Breast compression  Audible Swallowing: None Intervention(s): Skin to skin;Hand expression  Type of Nipple: Everted at rest and after stimulation (large nipple,  not easily compressible.)  Comfort (Breast/Nipple): Soft / non-tender     Hold (Positioning): Assistance needed to correctly position infant at breast and maintain latch. Intervention(s): Breastfeeding basics reviewed;Support Pillows;Position options;Skin to skin  LATCH Score: 5  Lactation Tools Discussed/Used Tools: Other (comment) (Spoons, curve-tipped syringe. )   Consult Status Consult Status: Follow-up Date: 03/30/15 Follow-up type: In-patient    Geralynn OchsWILLIARD, Wasif Simonich 03/29/2015, 9:52 AM

## 2015-03-29 NOTE — Progress Notes (Signed)
Inpatient Diabetes Program Recommendations  AACE/ADA: New Consensus Statement on Inpatient Glycemic Control (2015)  Target Ranges:  Prepandial:   less than 140 mg/dL      Peak postprandial:   less than 180 mg/dL (1-2 hours)      Critically ill patients:  140 - 180 mg/dL   Review of Glycemic Control  Inpatient Diabetes Program Recommendations:    Noted patient have some hypoglycemia. Ordered lantus at 6 units and 4 units meal coverage tidwc Would recommend using the Glycemic Control order set for non-pregnant adults and order the sensitive correction scale tidwc and the HS correction scale rather than a set dose of meal coverage Check cbg's tid before meals, use correction at that time. If high doses are needed, can add low dose meal coverage. If fastings are elevated, increase lantus incrementally by 2 units until controlled less than 150 mg/dL.  Thank you Melissa CoffinAnn Drema Eddington, RN, MSN, CDE  Diabetes Inpatient Program Office: [phone] Pager: [phone] 8:00 am to 5:00 pm

## 2015-03-29 NOTE — Progress Notes (Signed)
   03/29/15 0020  Vitals  Temp 97.7 F (36.5 C)  Temp Source Axillary  BP (!) 145/112 mmHg  MAP (mmHg) 123  BP Location Right Arm  BP Method Automatic  Patient Position (if appropriate) Lying  Pulse Rate 79  Pulse Rate Source Monitor  Resp 18  Oxygen Therapy  SpO2 100 %  O2 Device Nasal Cannula  O2 Flow Rate (L/min) 2 L/min  Pulse Oximetry Type Continuous  End Tidal CO2 (EtCO2) 31  MD at bedside. Lasix 20  Mg IV and Labetalol 200 mg po ordered. We will continue to monitor.

## 2015-03-29 NOTE — Progress Notes (Signed)
Subjective: Postpartum Day 1: Cesarean Delivery Patient reports incisional pain and tolerating PO.  Denies headache ,orruq pain,  Objective: Vital signs in last 24 hours:BP 145/112 mmHg  Pulse 79  Temp(Src) 97.7 F (36.5 C) (Axillary)  Resp 18  Ht 5\' 5"  (1.651 m)  Wt 276 lb 9.6 oz (125.465 kg)  BMI 46.03 kg/m2  SpO2 100%  LMP 06/28/2014  Breastfeeding? Unknown  Called for elevated bp, also noting persistent edema of thighs and pannus 4+.  Temp:  [97.7 F (36.5 C)-98.6 F (37 C)] 97.7 F (36.5 C) (11/03 0020) Pulse Rate:  [75-98] 79 (11/03 0020) Resp:  [6-34] 18 (11/03 0020) BP: (145-177)/(74-128) 145/112 mmHg (11/03 0020) SpO2:  [92 %-100 %] 100 % (11/03 0020) FiO2 (%):  [97 %] 97 % (11/02 1923) Weight:  [276 lb 9.6 oz (125.465 kg)] 276 lb 9.6 oz (125.465 kg) (11/02 1923)  Intake/Output Summary (Last 24 hours) at 03/29/15 0101 Last data filed at 03/29/15 0000  Gross per 24 hour  Intake 4934.54 ml  Output   2450 ml  Net 2484.54 ml    Physical Exam:  General: alert, cooperative, no distress, morbidly obese and 4+ edema Lochia: appropriate Uterine Fundus: hidden by pannus Incision: Pico in place, had bleeding into gauze shortly after c/s but it has remained minimally changed since RR DVT Evaluation: No evidence of DVT seen on physical exam. Calf/Ankle edema is present. Reflexes 1+ . CBG (last 3)   Recent Labs  03/28/15 1746 03/28/15 2121 03/28/15 2356  GLUCAP 122* 63* 122*     Recent Labs  03/27/15 1005 03/28/15 0925  HGB 13.7 14.7  HCT 38.5 40.6    Assessment/Plan: Status post Cesarean section. Postoperative course complicated by hypertension exacerbation., fluid retention  Rx lasix 20 iv q6h x 2 Labetalol 200 po x 1.  Cbc, cmet in am..  Sonia Stickels V 03/29/2015, 12:58 AM

## 2015-03-30 ENCOUNTER — Encounter (HOSPITAL_COMMUNITY): Payer: Self-pay | Admitting: *Deleted

## 2015-03-30 DIAGNOSIS — R609 Edema, unspecified: Secondary | ICD-10-CM | POA: Diagnosis present

## 2015-03-30 DIAGNOSIS — R0902 Hypoxemia: Secondary | ICD-10-CM | POA: Clinically undetermined

## 2015-03-30 LAB — CBC
HEMATOCRIT: 22.8 % — AB (ref 36.0–46.0)
HEMOGLOBIN: 8.1 g/dL — AB (ref 12.0–15.0)
MCH: 30 pg (ref 26.0–34.0)
MCHC: 35.5 g/dL (ref 30.0–36.0)
MCV: 84.4 fL (ref 78.0–100.0)
Platelets: 115 10*3/uL — ABNORMAL LOW (ref 150–400)
RBC: 2.7 MIL/uL — ABNORMAL LOW (ref 3.87–5.11)
RDW: 13.9 % (ref 11.5–15.5)
WBC: 11.5 10*3/uL — ABNORMAL HIGH (ref 4.0–10.5)

## 2015-03-30 LAB — GLUCOSE, CAPILLARY
GLUCOSE-CAPILLARY: 86 mg/dL (ref 65–99)
Glucose-Capillary: 70 mg/dL (ref 65–99)

## 2015-03-30 MED ORDER — HYDROCHLOROTHIAZIDE 25 MG PO TABS: 25.0000 mg | ORAL_TABLET | Freq: Every day | ORAL | Status: DC

## 2015-03-30 MED ORDER — INSULIN ASPART 100 UNIT/ML ~~LOC~~ SOLN: 4.0000 [IU] | Freq: Three times a day (TID) | SUBCUTANEOUS | Status: DC

## 2015-03-30 MED ORDER — OXYCODONE-ACETAMINOPHEN 5-325 MG PO TABS: 1.0000 | ORAL_TABLET | ORAL | Status: DC | PRN

## 2015-03-30 MED ORDER — INSULIN GLARGINE 100 UNIT/ML ~~LOC~~ SOLN: 6.0000 [IU] | Freq: Every day | SUBCUTANEOUS | Status: DC

## 2015-03-30 NOTE — Lactation Note (Addendum)
This note was copied from the chart of Melissa Wallis MartShannon Spranger. Lactation Consultation Note  Patient Name: Melissa Wallis MartShannon Braun Melissa Braun: 03/30/2015 Reason for consult: Follow-up assessment   With this mom and term baby, now 7947 hours old. Mom is being discharged from AICU. She had the baby stay in CNS and bottle/formula fed through the night. Mom allowed me to undress the baby, and attempt to latch the baby. He latched well with dee latch, began to suckle, mom said latch was comfortable, I showed mom what a deep latch looked like, and explained how we want him breast feeding, not nipple sucking. The baby fell asleep after a couple of minutes. Mom has easily expressed transitional milk. She has a DEP. I advised mom to breast feed on cue, and if she can not bget the baby to breast feed, and her breast are getting full, to pump to comfort. Breast milk storage guidelines, and normal # wet and dirty diapers also reviewed. Mom knows to call lactation for questions/concerns., and o/p prn.   Maternal Data    Feeding Feeding Type: Breast Fed  LATCH Score/Interventions Latch: Repeated attempts needed to sustain latch, nipple held in mouth throughout feeding, stimulation needed to elicit sucking reflex. Intervention(s): Skin to skin;Teach feeding cues;Waking techniques Intervention(s): Adjust position;Assist with latch  Audible Swallowing: None  Type of Nipple: Everted at rest and after stimulation  Comfort (Breast/Nipple): Soft / non-tender     Hold (Positioning): Assistance needed to correctly position infant at breast and maintain latch. Intervention(s): Breastfeeding basics reviewed;Support Pillows;Position options;Skin to skin  LATCH Score: 6  Lactation Tools Discussed/Used     Consult Status Consult Status: Complete Follow-up type: Call as needed    Alfred LevinsLee, Dionne Rossa Anne 03/30/2015, 2:48 PM
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
## 2015-03-30 NOTE — Discharge Instructions (Signed)
Postpartum Care After Cesarean Delivery After you deliver your newborn (postpartum period), the usual stay in the hospital is 24-72 hours. If there were problems with your labor or delivery, or if you have other medical problems, you might be in the hospital longer.  While you are in the hospital, you will receive help and instructions on how to care for yourself and your newborn during the postpartum period.  While you are in the hospital:  It is normal for you to have pain or discomfort from the incision in your abdomen. Be sure to tell your nurses when you are having pain, where the pain is located, and what makes the pain worse.  If you are breastfeeding, you may feel uncomfortable contractions of your uterus for a couple of weeks. This is normal. The contractions help your uterus get back to normal size.  It is normal to have some bleeding after delivery.  For the first 1-3 days after delivery, the flow is red and the amount may be similar to a period.  It is common for the flow to start and stop.  In the first few days, you may pass some small clots. Let your nurses know if you begin to pass large clots or your flow increases.  Do not  flush blood clots down the toilet before having the nurse look at them.  During the next 3-10 days after delivery, your flow should become more watery and pink or brown-tinged in color.  Ten to fourteen days after delivery, your flow should be a small amount of yellowish-white discharge.  The amount of your flow will decrease over the first few weeks after delivery. Your flow may stop in 6-8 weeks. Most women have had their flow stop by 12 weeks after delivery.  You should change your sanitary pads frequently.  Wash your hands thoroughly with soap and water for at least 20 seconds after changing pads, using the toilet, or before holding or feeding your newborn.  Your intravenous (IV) tubing will be removed when you are drinking enough fluids.  The  urine drainage tube (urinary catheter) that was inserted before delivery may be removed within 6-8 hours after delivery or when feeling returns to your legs. You should feel like you need to empty your bladder within the first 6-8 hours after the catheter has been removed.  In case you become weak, lightheaded, or faint, call your nurse before you get out of bed for the first time and before you take a shower for the first time.  Within the first few days after delivery, your breasts may begin to feel tender and full. This is called engorgement. Breast tenderness usually goes away within 48-72 hours after engorgement occurs. You may also notice milk leaking from your breasts. If you are not breastfeeding, do not stimulate your breasts. Breast stimulation can make your breasts produce more milk.  Spending as much time as possible with your newborn is very important. During this time, you and your newborn can feel close and get to know each other. Having your newborn stay in your room (rooming in) will help to strengthen the bond with your newborn. It will give you time to get to know your newborn and become comfortable caring for your newborn.  Your hormones change after delivery. Sometimes the hormone changes can temporarily cause you to feel sad or tearful. These feelings should not last more than a few days. If these feelings last longer than that, you should talk to your  caregiver.  If desired, talk to your caregiver about methods of family planning or contraception.  Talk to your caregiver about immunizations. Your caregiver may want you to have the following immunizations before leaving the hospital:  Tetanus, diphtheria, and pertussis (Tdap) or tetanus and diphtheria (Td) immunization. It is very important that you and your family (including grandparents) or others caring for your newborn are up-to-date with the Tdap or Td immunizations. The Tdap or Td immunization can help protect your newborn  from getting ill.  Rubella immunization.  Varicella (chickenpox) immunization.  Influenza immunization. You should receive this annual immunization if you did not receive the immunization during your pregnancy.   This information is not intended to replace advice given to you by your health care provider. Make sure you discuss any questions you have with your health care provider.   Document Released: 02/04/2012 Document Reviewed: 02/04/2012 Elsevier Interactive Patient Education 2016 [employer] Inc[employer]  Postpartum Tubal Ligation, Care After Refer to this sheet in the next few weeks. These instructions provide you with information about caring for yourself after your procedure. Your health care provider may also give you more specific instructions. Your treatment has been planned according to current medical practices, but problems sometimes occur. Call your health care provider if you have any problems or questions after your procedure. WHAT TO EXPECT AFTER THE PROCEDURE After your procedure, it is common to have:  Sore throat.  Soreness at the incision site.  Mild cramping.  Tiredness.  Mild nausea or vomiting. HOME CARE INSTRUCTIONS 1. Rest for the remainder of the day. 2. Take medicines only as directed by your health care provider. These include over-the-counter medicines and prescription medicines. Do not take aspirin, which can cause bleeding. 3. Over the next few days, gradually return to your normal activities and your normal diet. 4. Avoid sexual intercourse for 2 weeks or as directed by your health care provider. 5. Do not drive or operate heavy machinery while taking pain medicine. 6. Do not lift anything that is heavier than 5 lb (2.3 kg) for 2 weeks or as directed by your health care provider. 7. Do not take baths. Take showers only. Ask your health care provider when you can start taking baths. 8. Take your temperature twice each day and write it down. 9. Try to have  help for the first 7-10 days for your household needs. 10. There are many different ways to close and cover an incision, including stitches (sutures), skin glue, and adhesive strips. Follow instructions from your health care provider about: 1. Incision care. 2. Bandage (dressing) changes and removal. 3. Incision closure removal. 11. Check your incision area every day for signs of infection. Watch for: 1. Redness, swelling, or pain. 2. Fluid, blood, or pus. 12. Keep all follow-up visits as directed by your health care provider. SEEK MEDICAL CARE IF:  You have redness, swelling, or increasing pain in your incision area.  You have fluid or pus coming from your incision for longer than 1 day.  You notice a bad smell coming from your incision or your dressing.  The edges of your incision break open after the sutures have been removed.  Your pain does not decrease after 2-3 days.  You have a rash.  You repeatedly become dizzy or light-headed.  You have a reaction to your medicine.  Your pain medicine is not helping.  You are constipated. SEEK IMMEDIATE MEDICAL CARE IF:   You have a fever.  You faint.  You have  increasing pain in your abdomen.  You have bleeding or drainage from your suture sites or your vagina after surgery.  You have shortness of breath or have difficulty breathing.  You have chest pain or leg pain.  You have ongoing nausea, vomiting, or diarrhea.   This information is not intended to replace advice given to you by your health care provider. Make sure you discuss any questions you have with your health care provider.   Document Released: 11/11/2011 Document Revised: 09/26/2014 Document Reviewed: 11/11/2011 Elsevier Interactive Patient Education 2016 Else[employer] Inc.  Sleep Apnea  Sleep apnea is a sleep disorder characterized by abnormal pauses in breathing while you sleep. When your breathing pauses, the level of oxygen in your blood decreases. This  causes you to move out of deep sleep and into light sleep. As a result, your quality of sleep is poor, and the system that carries your blood throughout your body (cardiovascular system) experiences stress. If sleep apnea remains untreated, the following conditions can develop:  High blood pressure (hypertension).  Coronary artery disease.  Inability to achieve or maintain an erection (impotence).  Impairment of your thought process (cognitive dysfunction). There are three types of sleep apnea: 13. Obstructive sleep apnea--Pauses in breathing during sleep because of a blocked airway. 14. Central sleep apnea--Pauses in breathing during sleep because the area of the brain that controls your breathing does not send the correct signals to the muscles that control breathing. 15. Mixed sleep apnea--A combination of both obstructive and central sleep apnea. RISK FACTORS The following risk factors can increase your risk of developing sleep apnea:  Being overweight.  Smoking.  Having narrow passages in your nose and throat.  Being of older age.  Being female.  Alcohol use.  Sedative and tranquilizer use.  Ethnicity. Among individuals younger than 35 years, African Americans are at increased risk of sleep apnea. SYMPTOMS   Difficulty staying asleep.  Daytime sleepiness and fatigue.  Loss of energy.  Irritability.  Loud, heavy snoring.  Morning headaches.  Trouble concentrating.  Forgetfulness.  Decreased interest in sex.  Unexplained sleepiness. DIAGNOSIS  In order to diagnose sleep apnea, your caregiver will perform a physical examination. A sleep study done in the comfort of your own home may be appropriate if you are otherwise healthy. Your caregiver may also recommend that you spend the night in a sleep lab. In the sleep lab, several monitors record information about your heart, lungs, and brain while you sleep. Your leg and arm movements and blood oxygen level are also  recorded. TREATMENT The following actions may help to resolve mild sleep apnea:  Sleeping on your side.   Using a decongestant if you have nasal congestion.   Avoiding the use of depressants, including alcohol, sedatives, and narcotics.   Losing weight and modifying your diet if you are overweight. There also are devices and treatments to help open your airway:  Oral appliances. These are custom-made mouthpieces that shift your lower jaw forward and slightly open your bite. This opens your airway.  Devices that create positive airway pressure. This positive pressure "splints" your airway open to help you breathe better during sleep. The following devices create positive airway pressure:  Continuous positive airway pressure (CPAP) device. The CPAP device creates a continuous level of air pressure with an air pump. The air is delivered to your airway through a mask while you sleep. This continuous pressure keeps your airway open.  Nasal expiratory positive airway pressure (EPAP) device. The EPAP device  creates positive air pressure as you exhale. The device consists of single-use valves, which are inserted into each nostril and held in place by adhesive. The valves create very little resistance when you inhale but create much more resistance when you exhale. That increased resistance creates the positive airway pressure. This positive pressure while you exhale keeps your airway open, making it easier to breath when you inhale again.  Bilevel positive airway pressure (BPAP) device. The BPAP device is used mainly in patients with central sleep apnea. This device is similar to the CPAP device because it also uses an air pump to deliver continuous air pressure through a mask. However, with the BPAP machine, the pressure is set at two different levels. The pressure when you exhale is lower than the pressure when you inhale.  Surgery. Typically, surgery is only done if you cannot comply with less  invasive treatments or if the less invasive treatments do not improve your condition. Surgery involves removing excess tissue in your airway to create a wider passage way.   This information is not intended to replace advice given to you by your health care provider. Make sure you discuss any questions you have with your health care provider.   Document Released: 05/02/2002 Document Revised: 06/02/2014 Document Reviewed: 09/18/2011 Elsevier Interactive Patient Education [employer] Inc.

## 2015-03-31 LAB — TYPE AND SCREEN
ABO/RH(D): A POS
Antibody Screen: NEGATIVE
UNIT DIVISION: 0
UNIT DIVISION: 0

## 2015-04-04 ENCOUNTER — Ambulatory Visit: Payer: 59 | Admitting: *Deleted

## 2015-04-04 VITALS — BP 140/80 | Temp 98.7°F

## 2015-04-04 DIAGNOSIS — E559 Vitamin D deficiency, unspecified: Secondary | ICD-10-CM

## 2015-04-04 NOTE — Progress Notes (Signed)
Pt in for PICO removal. PICo was still running so I called Dr. Shawnie PonsPratt and she advised to remove it. Wound vac removed. Swelling and bruising noted around the incision. Melissa MaclachlanKaren Teague Clark came in to see incision. Advised patient to keep incision clean and dry, and call us if she has any problems.

## 2015-04-05 LAB — VITAMIN D 25 HYDROXY (VIT D DEFICIENCY, FRACTURES): Vit D, 25-Hydroxy: 21.1 ng/mL — ABNORMAL LOW (ref 30.0–100.0)

## 2015-04-10 ENCOUNTER — Other Ambulatory Visit: Payer: 59

## 2015-04-12 ENCOUNTER — Other Ambulatory Visit: Payer: 59

## 2015-04-27 ENCOUNTER — Encounter: Payer: Self-pay | Admitting: Family Medicine

## 2015-04-27 ENCOUNTER — Ambulatory Visit (INDEPENDENT_AMBULATORY_CARE_PROVIDER_SITE_OTHER): Payer: 59 | Admitting: Family Medicine

## 2015-04-27 DIAGNOSIS — I10 Essential (primary) hypertension: Secondary | ICD-10-CM

## 2015-04-27 DIAGNOSIS — E139 Other specified diabetes mellitus without complications: Secondary | ICD-10-CM

## 2015-04-27 NOTE — Progress Notes (Deleted)
Patient ID: Melissa ChimeShannon F Register, female   DOB: 02/12/1976, 39 y.o.   MRN: [id number]  Subjective:    Melissa Braun is a 39 y.o. female who presents for a postpartum visit. Postpartum Visit Patient is here for a postpartum visit. She is {0-10:33138} weeks postpartum following a {delivery:12449}. I have fully reviewed the prenatal and intrapartum course. The delivery was at *** gestational weeks. Outcome: {delivery outcome:32078}. Anesthesia: {procedures; anesthesia:812}.  Postpartum course has been ***. Baby's course has been ***. Baby is feeding by {breast/bottle:69}. Bleeding {vag bleed:12292}. Bowel function is {normal:32111}. Bladder function is {normal:32111}. Patient {is/is not:9024} sexually active. Contraception method is {contraceptive method:5051}. Postpartum depression screening: {neg default:13464::"negative"}.

## 2015-04-27 NOTE — Progress Notes (Signed)
  Subjective:     Melissa Braun is a 39 y.o. female who presents for a postpartum visit. She is 4 weeks postpartum following a low cervical transverse Cesarean section. I have fully reviewed the prenatal and intrapartum course. The delivery was at 39 gestational weeks. Outcome: primary cesarean section, low transverse incision. Anesthesia: spinal. Postpartum course has been normal. Baby's course has been normal. Baby is feeding by both breast and bottle - other. Bleeding thin lochia. Bowel function is normal. Bladder function is normal. Patient is not sexually active. Contraception method is tubal ligation. Postpartum depression screening: negative.  She has seen her PCP in the interim and was started on lasix.  Her blood pressure has been controlled off from antihypertensives.  Her blood sugars are controlled.  The following portions of the patient's history were reviewed and updated as appropriate: allergies, current medications, past family history, past medical history, past social history, past surgical history and problem list.  Review of Systems Pertinent items are noted in HPI.   Objective:    BP 139/67 mmHg  Pulse 94  Temp(Src) 97.6 F (36.4 C)  Wt 198 lb 9.6 oz (90.084 kg)  General:  alert, cooperative and no distress     Lungs: clear to auscultation bilaterally  Heart:  regular rate and rhythm, S1, S2 normal, no murmur, click, rub or gallop  Abdomen: soft, non-tender; bowel sounds normal; no masses,  no organomegaly.  Incision clean, dry, intact - well healed.        Assessment:     Normal postpartum exam.  DM2, HTN. Pap smear not done at today's visit.   Plan:    1. Contraception: tubal ligation 2. Continue insulin 3.  Discontinue antihypertensives. 4. Follow up in: 1 year or as needed.

## 2015-04-27 NOTE — Patient Instructions (Signed)
Postpartum Depression and Baby Blues °The postpartum period begins right after the birth of a baby. During this time, there is often a great amount of joy and excitement. It is also a time of many changes in the life of the parents. Regardless of how many times a mother gives birth, each child brings new challenges and dynamics to the family. It is not unusual to have feelings of excitement along with confusing shifts in moods, emotions, and thoughts. All mothers are at risk of developing postpartum depression or the "baby blues." These mood changes can occur right after giving birth, or they may occur many months after giving birth. The baby blues or postpartum depression can be mild or severe. Additionally, postpartum depression can go away rather quickly, or it can be a long-term condition.  °CAUSES °Raised hormone levels and the rapid drop in those levels are thought to be a main cause of postpartum depression and the baby blues. A number of hormones change during and after pregnancy. Estrogen and progesterone usually decrease right after the delivery of your baby. The levels of thyroid hormone and various cortisol steroids also rapidly drop. Other factors that play a role in these mood changes include major life events and genetics.  °RISK FACTORS °If you have any of the following risks for the baby blues or postpartum depression, know what symptoms to watch out for during the postpartum period. Risk factors that may increase the likelihood of getting the baby blues or postpartum depression include: °· Having a personal or family history of depression.   °· Having depression while being pregnant.   °· Having premenstrual mood issues or mood issues related to oral contraceptives. °· Having a lot of life stress.   °· Having marital conflict.   °· Lacking a social support network.   °· Having a baby with special needs.   °· Having health problems, such as diabetes.   °SIGNS AND SYMPTOMS °Symptoms of baby blues  include: °· Brief changes in mood, such as going from extreme happiness to sadness. °· Decreased concentration.   °· Difficulty sleeping.   °· Crying spells, tearfulness.   °· Irritability.   °· Anxiety.   °Symptoms of postpartum depression typically begin within the first month after giving birth. These symptoms include: °· Difficulty sleeping or excessive sleepiness.   °· Marked weight loss.   °· Agitation.   °· Feelings of worthlessness.   °· Lack of interest in activity or food.   °Postpartum psychosis is a very serious condition and can be dangerous. Fortunately, it is rare. Displaying any of the following symptoms is cause for immediate medical attention. Symptoms of postpartum psychosis include:  °· Hallucinations and delusions.   °· Bizarre or disorganized behavior.   °· Confusion or disorientation.   °DIAGNOSIS  °A diagnosis is made by an evaluation of your symptoms. There are no medical or lab tests that lead to a diagnosis, but there are various questionnaires that a health care provider may use to identify those with the baby blues, postpartum depression, or psychosis. Often, a screening tool called the Edinburgh Postnatal Depression Scale is used to diagnose depression in the postpartum period.  °TREATMENT °The baby blues usually goes away on its own in 1-2 weeks. Social support is often all that is needed. You will be encouraged to get adequate sleep and rest. Occasionally, you may be given medicines to help you sleep.  °Postpartum depression requires treatment because it can last several months or longer if it is not treated. Treatment may include individual or group therapy, medicine, or both to address any social, physiological, and psychological   factors that may play a role in the depression. Regular exercise, a healthy diet, rest, and social support may also be strongly recommended.  °Postpartum psychosis is more serious and needs treatment right away. Hospitalization is often needed. °HOME CARE  INSTRUCTIONS °· Get as much rest as you can. Nap when the baby sleeps.   °· Exercise regularly. Some women find yoga and walking to be beneficial.   °· Eat a balanced and nourishing diet.   °· Do little things that you enjoy. Have a cup of tea, take a bubble bath, read your favorite magazine, or listen to your favorite music. °· Avoid alcohol.   °· Ask for help with household chores, cooking, grocery shopping, or running errands as needed. Do not try to do everything.   °· Talk to people close to you about how you are feeling. Get support from your partner, family members, friends, or other new moms. °· Try to stay positive in how you think. Think about the things you are grateful for.   °· Do not spend a lot of time alone.   °· Only take over-the-counter or prescription medicine as directed by your health care provider. °· Keep all your postpartum appointments.   °· Let your health care provider know if you have any concerns.   °SEEK MEDICAL CARE IF: °You are having a reaction to or problems with your medicine. °SEEK IMMEDIATE MEDICAL CARE IF: °· You have suicidal feelings.   °· You think you may harm the baby or someone else. °MAKE SURE YOU: °· Understand these instructions. °· Will watch your condition. °· Will get help right away if you are not doing well or get worse. °  °This information is not intended to replace advice given to you by your health care provider. Make sure you discuss any questions you have with your health care provider. °  °Document Released: 02/14/2004 Document Revised: 05/17/2013 Document Reviewed: 02/21/2013 °Elsevier Interactive Patient Education ©2016 Elsevier Inc. ° °

## 2015-05-07 ENCOUNTER — Other Ambulatory Visit: Payer: Self-pay | Admitting: Obstetrics & Gynecology

## 2015-05-10 ENCOUNTER — Telehealth: Payer: Self-pay | Admitting: General Practice

## 2015-05-10 NOTE — Telephone Encounter (Signed)
Called patient regarding need to f/u with PCP. Patient states she has already seen her PCP and has another appt for f/u. Patient states she misplaced her return to work letter and would like to know if we can print her another. Told patient that is fine and we will have the letter waiting for her up front. Patient verbalized understanding & had no other questions

## 2016-06-20 DIAGNOSIS — E113591 Type 2 diabetes mellitus with proliferative diabetic retinopathy without macular edema, right eye: Secondary | ICD-10-CM | POA: Diagnosis not present

## 2016-06-20 DIAGNOSIS — H26492 Other secondary cataract, left eye: Secondary | ICD-10-CM | POA: Diagnosis not present

## 2016-08-25 DIAGNOSIS — E119 Type 2 diabetes mellitus without complications: Secondary | ICD-10-CM | POA: Diagnosis not present

## 2016-08-25 DIAGNOSIS — G63 Polyneuropathy in diseases classified elsewhere: Secondary | ICD-10-CM | POA: Diagnosis not present

## 2016-08-26 DIAGNOSIS — E119 Type 2 diabetes mellitus without complications: Secondary | ICD-10-CM | POA: Diagnosis not present

## 2016-08-26 DIAGNOSIS — E785 Hyperlipidemia, unspecified: Secondary | ICD-10-CM | POA: Diagnosis not present

## 2016-08-26 DIAGNOSIS — G63 Polyneuropathy in diseases classified elsewhere: Secondary | ICD-10-CM | POA: Diagnosis not present

## 2016-08-28 ENCOUNTER — Encounter: Payer: Self-pay | Admitting: Gastroenterology

## 2016-09-12 DIAGNOSIS — J3089 Other allergic rhinitis: Secondary | ICD-10-CM | POA: Diagnosis not present

## 2016-09-12 DIAGNOSIS — H1045 Other chronic allergic conjunctivitis: Secondary | ICD-10-CM | POA: Diagnosis not present

## 2016-09-22 DIAGNOSIS — E119 Type 2 diabetes mellitus without complications: Secondary | ICD-10-CM | POA: Diagnosis not present

## 2016-09-22 DIAGNOSIS — I10 Essential (primary) hypertension: Secondary | ICD-10-CM | POA: Diagnosis not present

## 2016-09-30 ENCOUNTER — Ambulatory Visit: Payer: 59 | Admitting: Gastroenterology

## 2016-09-30 ENCOUNTER — Ambulatory Visit (INDEPENDENT_AMBULATORY_CARE_PROVIDER_SITE_OTHER): Payer: 59 | Admitting: Gastroenterology

## 2016-09-30 ENCOUNTER — Other Ambulatory Visit: Payer: 59

## 2016-09-30 ENCOUNTER — Encounter: Payer: Self-pay | Admitting: Gastroenterology

## 2016-09-30 VITALS — BP 120/60 | HR 88 | Ht 62.0 in | Wt 219.2 lb

## 2016-09-30 DIAGNOSIS — R197 Diarrhea, unspecified: Secondary | ICD-10-CM

## 2016-09-30 NOTE — Patient Instructions (Addendum)
Go to the basement today for labs  Use Probiotic VSL #3 112 units 1 capsule daily, if no improvement will consider Xifaxian 550 mg three times a day for 2 weeks, If no improvements with that we will consider scheduling a colonoscopy

## 2016-09-30 NOTE — Progress Notes (Signed)
Melissa Braun    [id number]    03/12/1976  Primary Care Physician:Garba, Cheri Rous, MD  Referring Physician: Rometta Emery, MD [address] [place], [place] [address]  Chief complaint: Diarrhea  HPI: 41 year old female here for new patient visit with complaints of diarrhea since February. Diarrhea started after 2 of her kids had acute diarrheal illness in February that lasted 1 day. Her kids symptoms resolved and there bowel habits were back to normal after 1-2 days. She continued to have liquid stool with 2-3 bowel movements daily for about 2 months, 2 weeks ago started noticing her bowel movements are pink to be slightly formed pea soup consistency and breaks apart in the toilet bowl. She started eating Activia yogurt in the past 2 weeks. Denies any abdominal pain, nausea, vomiting, melena or blood in stool. No change in weight. No recent medication changes or diet changes. No family history of IBD or colon cancer. She works at [employer].   Outpatient Encounter Prescriptions as of 09/30/2016  Medication Sig  . ampicillin (PRINCIPEN) 250 MG capsule Take 250 mg by mouth 1 day or 1 dose.  Marland Kitchen ACCU-CHEK FASTCLIX LANCETS MISC 1 Units by Percutaneous route 4 (four) times daily.  Marland Kitchen aspirin 81 MG chewable tablet Chew 1 tablet (81 mg total) by mouth daily.  . Cholecalciferol (VITAMIN D) 2000 UNITS tablet Take 1 tablet (2,000 Units total) by mouth daily.  . Cholecalciferol (VITAMIN D3) 50000 UNITS TABS Take 1 tablet by mouth once a week.  . erythromycin with ethanol (EMGEL) 2 % gel 1 APPLICATION TO AFFECTED AREA TWICE A DAY EXTERNALLY 30 DAYS  . ferrous sulfate 325 (65 FE) MG tablet Take 1 tablet (325 mg total) by mouth 2 (two) times daily with a meal.  . glucose blood (ACCU-CHEK SMARTVIEW) test strip Check blood sugars 4x/daily  . insulin aspart (NOVOLOG) 100 UNIT/ML injection Inject 4 Units into the skin 3 (three) times daily with meals.  . insulin glargine (LANTUS) 100  UNIT/ML injection Inject 0.06 mLs (6 Units total) into the skin at bedtime.  . Prenatal Vit-Fe Fumarate-FA (PRENATAL MULTIVITAMIN) TABS Take 1 tablet by mouth daily.   No facility-administered encounter medications on file as of 09/30/2016.     Allergies as of 09/30/2016 - Review Complete 09/30/2016  Allergen Reaction Noted  . Bactrim Hives 08/17/2011  . Doxycycline Hives 08/17/2011  . Levofloxacin Itching and Other (See Comments)     Past Medical History:  Diagnosis Date  . Abnormal Pap smear   . Acne   . Anemia   . Diabetes mellitus AGE 8   DR Talmage Nap  . Heart murmur   . History of hiatal hernia    No problems since gastric bypass  . Hypertension    past h/o elevated BP with taking Tylenol Sinus  . Infertility   . LGSIL (low grade squamous intraepithelial dysplasia) 2008  . PONV (postoperative nausea and vomiting)   . Pregnancy induced hypertension   . Sickle cell trait [facility])     Past Surgical History:  Procedure Laterality Date  . CESAREAN SECTION N/A 09/04/2012   Procedure: Primary cesarean section with delivery of baby boy at 1126.  Apgars 5/7.;  Surgeon: Lesly Dukes, MD;  Location: [facility];  Service: Obstetrics;  Laterality: N/A;  . CESAREAN SECTION WITH BILATERAL TUBAL LIGATION Bilateral 03/28/2015   Procedure: CESAREAN SECTION WITH BILATERAL TUBAL LIGATION;  Surgeon: Catalina Antigua, MD;  Location: [facility];  Service: Obstetrics;  Laterality: Bilateral;  . EXPLORATORY LAPAROSCOPY     DUE TO RLQ PAIN  . GASTRIC BYPASS  nov. 2011   cary [place]    Family History  Problem Relation Age of Onset  . Diabetes Father   . Hypertension Father   . Cancer Father     prostate and mouth  . Sickle cell anemia Mother   . Anemia Mother   . Sickle cell trait Sister   . Hypertension Sister     Social History   Social History  . Marital status: Married    Spouse name: N/A  . Number of children: N/A  . Years of education: N/A   Occupational History  . Not on file.   Social  History Main Topics  . Smoking status: Never Smoker  . Smokeless tobacco: Never Used  . Alcohol use No  . Drug use: No  . Sexual activity: Yes    Birth control/ protection: IUD     Comment: MIRENA   Other Topics Concern  . Not on file   Social History Narrative  . No narrative on file      Review of systems: Review of Systems  Constitutional: Negative for fever and chills.  HENT: Negative.   Eyes: Negative for blurred vision.  Respiratory: Negative for cough, shortness of breath and wheezing.   Cardiovascular: Negative for chest pain and palpitations.  positive for heart murmur Gastrointestinal: as per HPI Genitourinary: Negative for dysuria, urgency, frequency and hematuria.  Musculoskeletal: Negative for myalgias, back pain and joint pain.  Skin: Negative for itching and rash.  Neurological: Negative for dizziness, tremors, focal weakness, seizures and loss of consciousness.  Endo/Heme/Allergies: Positive for seasonal allergies.  positive for anemia Psychiatric/Behavioral: Negative for depression, suicidal ideas and hallucinations.  All other systems reviewed and are negative.   Physical Exam: Vitals:   09/30/16 0907  BP: 120/60  Pulse: 88   Body mass index is 40.09 kg/m. Gen:      No acute distress HEENT:  EOMI, sclera anicteric Neck:     No masses; no thyromegaly Lungs:    Clear to auscultation bilaterally; normal respiratory effort CV:         Regular rate and rhythm; no murmurs Abd:      + bowel sounds; soft, non-tender; no palpable masses, no distension Ext:    No edema; adequate peripheral perfusion Skin:      Warm and dry; no rash Neuro: alert and oriented x 3 Psych: normal mood and affect  Data Reviewed:  Reviewed labs, radiology imaging, old records and pertinent past GI work up   Assessment and Plan/Recommendations:  41 year old female here for evaluation of diarrhea for past 8-10 weeks  Patient could possibly have postinfectious irritable  bowel syndrome predominant diarrhea We'll check stool GI pathogen panel to exclude infectious etiology Start probiotic VSL #3 112 units 1 capsule daily Check CBC, CMP, CRP, TTG and IgA level If no improvement in 2 weeks, we'll consider starting oral rifaximin 550 mg 3 times a day for 2 weeks for treatment of IBS- D If continues to have persistent symptoms may need colonoscopy with biopsies to evaluate for possible microscopic colitis, IBD  Return in 1 month   K. Scherry RanVeena Virginia Curl , MD [phone] Mon-Fri 8a-5p [phone] after 5p, weekends, holidays  CC: Rometta EmeryGarba, Mohammad L, MD

## 2016-10-01 LAB — COMPREHENSIVE METABOLIC PANEL
ALBUMIN: 4 g/dL (ref 3.5–5.5)
ALK PHOS: 126 IU/L — AB (ref 39–117)
ALT: 16 IU/L (ref 0–32)
AST: 16 IU/L (ref 0–40)
Albumin/Globulin Ratio: 1.7 (ref 1.2–2.2)
BUN / CREAT RATIO: 12 (ref 9–23)
BUN: 8 mg/dL (ref 6–24)
Bilirubin Total: 0.2 mg/dL (ref 0.0–1.2)
CALCIUM: 8.7 mg/dL (ref 8.7–10.2)
CO2: 22 mmol/L (ref 18–29)
CREATININE: 0.65 mg/dL (ref 0.57–1.00)
Chloride: 104 mmol/L (ref 96–106)
GFR calc Af Amer: 128 mL/min/{1.73_m2} (ref >59)
GFR calc non Af Amer: 111 mL/min/{1.73_m2} (ref >59)
GLOBULIN, TOTAL: 2.3 g/dL (ref 1.5–4.5)
Glucose: 127 mg/dL — ABNORMAL HIGH (ref 65–99)
Potassium: 4.1 mmol/L (ref 3.5–5.2)
SODIUM: 138 mmol/L (ref 134–144)
Total Protein: 6.3 g/dL (ref 6.0–8.5)

## 2016-10-01 LAB — CBC WITH DIFFERENTIAL/PLATELET
BASOS ABS: 0 10*3/uL (ref 0.0–0.2)
Basos: 0 %
EOS (ABSOLUTE): 0.2 10*3/uL (ref 0.0–0.4)
EOS: 2 %
HEMOGLOBIN: 8.2 g/dL — AB (ref 11.1–15.9)
Hematocrit: 27.9 % — ABNORMAL LOW (ref 34.0–46.6)
IMMATURE GRANS (ABS): 0 10*3/uL (ref 0.0–0.1)
IMMATURE GRANULOCYTES: 0 %
Lymphocytes Absolute: 2.1 10*3/uL (ref 0.7–3.1)
Lymphs: 27 %
MCH: 18.6 pg — ABNORMAL LOW (ref 26.6–33.0)
MCHC: 29.4 g/dL — ABNORMAL LOW (ref 31.5–35.7)
MCV: 63 fL — ABNORMAL LOW (ref 79–97)
MONOCYTES: 6 %
Monocytes Absolute: 0.4 10*3/uL (ref 0.1–0.9)
NEUTROS PCT: 65 %
Neutrophils Absolute: 4.9 10*3/uL (ref 1.4–7.0)
Platelets: 221 10*3/uL (ref 150–379)
RBC: 4.4 x10E6/uL (ref 3.77–5.28)
RDW: 18.3 % — ABNORMAL HIGH (ref 12.3–15.4)
WBC: 7.5 10*3/uL (ref 3.4–10.8)

## 2016-10-01 LAB — IGA: IGA/IMMUNOGLOBULIN A, SERUM: 242 mg/dL (ref 87–352)

## 2016-10-01 LAB — TISSUE TRANSGLUTAMINASE, IGA

## 2016-10-02 ENCOUNTER — Other Ambulatory Visit: Payer: 59

## 2016-10-02 DIAGNOSIS — R197 Diarrhea, unspecified: Secondary | ICD-10-CM

## 2016-10-06 LAB — GASTROINTESTINAL PATHOGEN PANEL PCR
C. DIFFICILE TOX A/B, PCR: NOT DETECTED
CAMPYLOBACTER, PCR: NOT DETECTED
CRYPTOSPORIDIUM, PCR: NOT DETECTED
E coli (ETEC) LT/ST PCR: NOT DETECTED
E coli (STEC) stx1/stx2, PCR: NOT DETECTED
E coli 0157, PCR: NOT DETECTED
Giardia lamblia, PCR: NOT DETECTED
Norovirus, PCR: NOT DETECTED
ROTAVIRUS, PCR: NOT DETECTED
SALMONELLA, PCR: NOT DETECTED
Shigella, PCR: NOT DETECTED

## 2016-10-21 DIAGNOSIS — E113593 Type 2 diabetes mellitus with proliferative diabetic retinopathy without macular edema, bilateral: Secondary | ICD-10-CM | POA: Diagnosis not present

## 2016-10-21 DIAGNOSIS — H25012 Cortical age-related cataract, left eye: Secondary | ICD-10-CM | POA: Diagnosis not present

## 2016-10-22 ENCOUNTER — Ambulatory Visit: Payer: 59 | Admitting: Gastroenterology

## 2016-11-03 ENCOUNTER — Encounter: Payer: Self-pay | Admitting: Women's Health

## 2016-11-03 ENCOUNTER — Ambulatory Visit (INDEPENDENT_AMBULATORY_CARE_PROVIDER_SITE_OTHER): Payer: 59 | Admitting: Women's Health

## 2016-11-03 VITALS — BP 126/84 | Ht 61.5 in | Wt 220.0 lb

## 2016-11-03 DIAGNOSIS — E139 Other specified diabetes mellitus without complications: Secondary | ICD-10-CM

## 2016-11-03 DIAGNOSIS — Z1151 Encounter for screening for human papillomavirus (HPV): Secondary | ICD-10-CM | POA: Diagnosis not present

## 2016-11-03 DIAGNOSIS — E109 Type 1 diabetes mellitus without complications: Secondary | ICD-10-CM

## 2016-11-03 DIAGNOSIS — Z01419 Encounter for gynecological examination (general) (routine) without abnormal findings: Secondary | ICD-10-CM

## 2016-11-03 NOTE — Progress Notes (Signed)
Melissa Braun 10/06/1975 [id number]    History:    Presents for annual exam with no complaints. Regular cycles/BTL/Vasectomy. Denies abnormal bleeding, discharge, or urinary symptoms.    Diabetes Mellitus, Type I.5, last hemoglobin A1c 8. Has not had recent follow-up with Dr. Talmage NapBalan. H/o infertility, had 2 spontaneous pregrancies after adopting 2 children.  Normal Pap history.    Past medical history, past surgical history, family history and social history were all reviewed and documented in the EPIC chart. Works at [employer]. Has 3 sons and 1 daughter daughter is oldest.  ROS:  A ROS was performed and pertinent positives and negatives are included.  Exam:  Vitals:   11/03/16 1620  BP: 126/84  Weight: 220 lb (99.8 kg)  Height: 5' 1.5" (1.562 m)   Body mass index is 40.9 kg/m.   General appearance:  Normal Thyroid:  Symmetrical, normal in size, without palpable masses or nodularity. Respiratory  Auscultation:  Clear without wheezing or rhonchi Cardiovascular  Auscultation:  Regular rate, without rubs, murmurs or gallops  Edema/varicosities:  Not grossly evident Abdominal  Soft,nontender, without masses, guarding or rebound.  Liver/spleen:  No organomegaly noted  Hernia:  None appreciated  Skin  Inspection:  Grossly normal   Breasts: Examined lying and sitting.     Right: Without masses, retractions, discharge or axillary adenopathy.     Left: Without masses, retractions, discharge or axillary adenopathy. Gentitourinary   Inguinal/mons:  Normal without inguinal adenopathy  External genitalia:  Normal  BUS/Urethra/Skene's glands:  Normal  Vagina:  Normal  Cervix:  Normal  Uterus:normal in size, shape and contour.  Midline and mobile  Adnexa/parametria:     Rt: Without masses or tenderness.   Lt: Without masses or tenderness.  Anus and perineum: Normal  Assessment/Plan:  41 y.o. MBF G2P2 + 2 adopted children for annual exam with no complaints.    Monthly  cycle/Bilateral Tubal Ligation Diabetes Mellitus, Type I.5 on insulin--Internal Medicine managing Obesity history of gastric bypass BMI >  35  Plan: Hemoglobin A1c, instructed to schedule appointment with Dr. Talmage NapBalan for followup. SBE's, increase regular exercise and decrease calories for weight loss, DASH and Diabetic diets recommended and reviewed, multivitamin daily, Pap with HR HPV typing, new screening guidelines reviewed. Recommended mammogram per age guidelines-patient to schedule. Breast Center information given.    Harrington Challengerancy J Young [facility]NP, 4:59 PM 11/03/2016

## 2016-11-03 NOTE — Patient Instructions (Signed)
Mammogram  [phone]  Breast center  Carbohydrate Counting for Diabetes Mellitus, Adult Carbohydrate counting is a method for keeping track of how many carbohydrates you eat. Eating carbohydrates naturally increases the amount of sugar (glucose) in the blood. Counting how many carbohydrates you eat helps keep your blood glucose within normal limits, which helps you manage your diabetes (diabetes mellitus). It is important to know how many carbohydrates you can safely have in each meal. This is different for every person. A diet and nutrition specialist (registered dietitian) can help you make a meal plan and calculate how many carbohydrates you should have at each meal and snack. Carbohydrates are found in the following foods:  Grains, such as breads and cereals.  Dried beans and soy products.  Starchy vegetables, such as potatoes, peas, and corn.  Fruit and fruit juices.  Milk and yogurt.  Sweets and snack foods, such as cake, cookies, candy, chips, and soft drinks.  How do I count carbohydrates? There are two ways to count carbohydrates in food. You can use either of the methods or a combination of both. Reading "Nutrition Facts" on packaged food The "Nutrition Facts" list is included on the labels of almost all packaged foods and beverages in the [place] It includes:  The serving size.  Information about nutrients in each serving, including the grams (g) of carbohydrate per serving.  To use the "Nutrition Facts":  Decide how many servings you will have.  Multiply the number of servings by the number of carbohydrates per serving.  The resulting number is the total amount of carbohydrates that you will be having.  Learning standard serving sizes of other foods When you eat foods containing carbohydrates that are not packaged or do not include "Nutrition Facts" on the label, you need to measure the servings in order to count the amount of carbohydrates:  Measure the foods that you  will eat with a food scale or measuring cup, if needed.  Decide how many standard-size servings you will eat.  Multiply the number of servings by 15. Most carbohydrate-rich foods have about 15 g of carbohydrates per serving. ? For example, if you eat 8 oz (170 g) of strawberries, you will have eaten 2 servings and 30 g of carbohydrates (2 servings x 15 g = 30 g).  For foods that have more than one food mixed, such as soups and casseroles, you must count the carbohydrates in each food that is included.  The following list contains standard serving sizes of common carbohydrate-rich foods. Each of these servings has about 15 g of carbohydrates:   hamburger bun or  English muffin.   oz (15 mL) syrup.   oz (14 g) jelly.  1 slice of bread.  1 six-inch tortilla.  3 oz (85 g) cooked rice or pasta.  4 oz (113 g) cooked dried beans.  4 oz (113 g) starchy vegetable, such as peas, corn, or potatoes.  4 oz (113 g) hot cereal.  4 oz (113 g) mashed potatoes or  of a large baked potato.  4 oz (113 g) canned or frozen fruit.  4 oz (120 mL) fruit juice.  4-6 crackers.  6 chicken nuggets.  6 oz (170 g) unsweetened dry cereal.  6 oz (170 g) plain fat-free yogurt or yogurt sweetened with artificial sweeteners.  8 oz (240 mL) milk.  8 oz (170 g) fresh fruit or one small piece of fruit.  24 oz (680 g) popped popcorn.  Example of carbohydrate counting Sample meal  3 oz (85 g) chicken breast.  6 oz (170 g) brown rice.  4 oz (113 g) corn.  8 oz (240 mL) milk.  8 oz (170 g) strawberries with sugar-free whipped topping. Carbohydrate calculation 1. Identify the foods that contain carbohydrates: ? Rice. ? Corn. ? Milk. ? Strawberries. 2. Calculate how many servings you have of each food: ? 2 servings rice. ? 1 serving corn. ? 1 serving milk. ? 1 serving strawberries. 3. Multiply each number of servings by 15 g: ? 2 servings rice x 15 g = 30 g. ? 1 serving corn x 15 g  = 15 g. ? 1 serving milk x 15 g = 15 g. ? 1 serving strawberries x 15 g = 15 g. 4. Add together all of the amounts to find the total grams of carbohydrates eaten: ? 30 g + 15 g + 15 g + 15 g = 75 g of carbohydrates total. This information is not intended to replace advice given to you by your health care provider. Make sure you discuss any questions you have with your health care provider. Document Released: 05/12/2005 Document Revised: 11/30/2015 Document Reviewed: 10/24/2015 Elsevier Interactive Patient Education  [employer].

## 2016-11-04 DIAGNOSIS — E109 Type 1 diabetes mellitus without complications: Secondary | ICD-10-CM | POA: Diagnosis not present

## 2016-11-04 NOTE — Addendum Note (Signed)
Addended by: Berna SpareASTILLO, Emmajean Ratledge A on: 11/04/2016 09:49 AM   Modules accepted: Orders

## 2016-11-06 ENCOUNTER — Ambulatory Visit: Payer: 59 | Admitting: Gastroenterology

## 2016-11-10 LAB — PAP, TP IMAGING W/ HPV RNA, RFLX HPV TYPE 16,18/45: HPV mRNA, High Risk: NOT DETECTED

## 2016-11-19 ENCOUNTER — Other Ambulatory Visit: Payer: Self-pay | Admitting: Women's Health

## 2016-11-19 MED ORDER — FLUCONAZOLE 150 MG PO TABS: 150.0000 mg | ORAL_TABLET | Freq: Once | ORAL | 0 refills | Status: AC

## 2016-12-01 ENCOUNTER — Other Ambulatory Visit: Payer: Self-pay | Admitting: General Surgery

## 2016-12-19 DIAGNOSIS — E113591 Type 2 diabetes mellitus with proliferative diabetic retinopathy without macular edema, right eye: Secondary | ICD-10-CM | POA: Diagnosis not present

## 2016-12-19 DIAGNOSIS — H26492 Other secondary cataract, left eye: Secondary | ICD-10-CM | POA: Diagnosis not present

## 2016-12-19 DIAGNOSIS — E113532 Type 2 diabetes mellitus with proliferative diabetic retinopathy with traction retinal detachment not involving the macula, left eye: Secondary | ICD-10-CM | POA: Diagnosis not present

## 2016-12-19 DIAGNOSIS — H2511 Age-related nuclear cataract, right eye: Secondary | ICD-10-CM | POA: Diagnosis not present

## 2016-12-28 DIAGNOSIS — R05 Cough: Secondary | ICD-10-CM | POA: Diagnosis not present

## 2016-12-28 DIAGNOSIS — J019 Acute sinusitis, unspecified: Secondary | ICD-10-CM | POA: Diagnosis not present

## 2017-01-09 ENCOUNTER — Ambulatory Visit (INDEPENDENT_AMBULATORY_CARE_PROVIDER_SITE_OTHER): Payer: 59 | Admitting: Gastroenterology

## 2017-01-09 ENCOUNTER — Encounter: Payer: Self-pay | Admitting: Gastroenterology

## 2017-01-09 VITALS — BP 110/70 | HR 84 | Ht 61.0 in | Wt 218.0 lb

## 2017-01-09 DIAGNOSIS — K580 Irritable bowel syndrome with diarrhea: Secondary | ICD-10-CM | POA: Diagnosis not present

## 2017-01-09 DIAGNOSIS — R197 Diarrhea, unspecified: Secondary | ICD-10-CM

## 2017-01-09 NOTE — Progress Notes (Signed)
Melissa Braun    [id number]    07/19/1975  Primary Care Physician:Garba, Cheri Rous, MD  Referring Physician: Rometta Emery, MD [address] [place], [place] [address]  Chief complaint:  Diarrhea  HPI: 40 year-old female here for follow-up visit. Diarrhea has improved after she completed a course of  rifaximin followed by Probiotic VSL#3. She is currently have 1-2 bowel movement daily and never more than 3 bowel movement. No watery diarrhea. Stool is mostly formed, sometimes semi formed.  GI pathogen panel negative. TTG  IgA antibody negative.  Denies any nausea, vomiting, abdominal pain, melena or bright red blood per rectum  Previous HPI: 42 year old female here for new patient visit with complaints of diarrhea since February. Diarrhea started after 2 of her kids had acute diarrheal illness in February that lasted 1 day. Her kids symptoms resolved and there bowel habits were back to normal after 1-2 days. She continued to have liquid stool with 2-3 bowel movements daily for about 2 months, 2 weeks ago started noticing her bowel movements are pink to be slightly formed pea soup consistency and breaks apart in the toilet bowl. She started eating Activia yogurt in the past 2 weeks. Denies any abdominal pain, nausea, vomiting, melena or blood in stool. No change in weight. No recent medication changes or diet changes. No family history of IBD or colon cancer. She works at [employer].   Outpatient Encounter Prescriptions as of 01/09/2017  Medication Sig  . ACCU-CHEK FASTCLIX LANCETS MISC 1 Units by Percutaneous route 4 (four) times daily.  Marland Kitchen aspirin 81 MG chewable tablet Chew 1 tablet (81 mg total) by mouth daily.  . Cholecalciferol (VITAMIN D) 2000 UNITS tablet Take 1 tablet (2,000 Units total) by mouth daily.  . Cholecalciferol (VITAMIN D3) 50000 UNITS TABS Take 1 tablet by mouth once a week.  . erythromycin with ethanol (EMGEL) 2 % gel 1 APPLICATION TO AFFECTED AREA  TWICE A DAY EXTERNALLY 30 DAYS  . ferrous sulfate 325 (65 FE) MG tablet Take 1 tablet (325 mg total) by mouth 2 (two) times daily with a meal.  . glucose blood (ACCU-CHEK SMARTVIEW) test strip Check blood sugars 4x/daily  . insulin aspart (NOVOLOG) 100 UNIT/ML injection Inject 4 Units into the skin 3 (three) times daily with meals.  . insulin glargine (LANTUS) 100 UNIT/ML injection Inject 0.06 mLs (6 Units total) into the skin at bedtime.  . Prenatal Vit-Fe Fumarate-FA (PRENATAL MULTIVITAMIN) TABS Take 1 tablet by mouth daily.   No facility-administered encounter medications on file as of 01/09/2017.     Allergies as of 01/09/2017 - Review Complete 11/03/2016  Allergen Reaction Noted  . Bactrim Hives 08/17/2011  . Doxycycline Hives 08/17/2011  . Levofloxacin Itching and Other (See Comments)     Past Medical History:  Diagnosis Date  . Abnormal Pap smear   . Acne   . Anemia   . Diabetes mellitus AGE 75   DR Talmage Nap  . Heart murmur   . History of hiatal hernia    No problems since gastric bypass  . Hypertension    past h/o elevated BP with taking Tylenol Sinus  . Infertility   . LGSIL (low grade squamous intraepithelial dysplasia) 2008  . PONV (postoperative nausea and vomiting)   . Pregnancy induced hypertension   . Sickle cell trait [facility])     Past Surgical History:  Procedure Laterality Date  . CESAREAN SECTION N/A 09/04/2012   Procedure:  Primary cesarean section with delivery of baby boy at 30.  Apgars 5/7.;  Surgeon: Lesly Dukes, MD;  Location: [facility];  Service: Obstetrics;  Laterality: N/A;  . CESAREAN SECTION WITH BILATERAL TUBAL LIGATION Bilateral 03/28/2015   Procedure: CESAREAN SECTION WITH BILATERAL TUBAL LIGATION;  Surgeon: Catalina Antigua, MD;  Location: [facility];  Service: Obstetrics;  Laterality: Bilateral;  . EXPLORATORY LAPAROSCOPY     DUE TO RLQ PAIN  . GASTRIC BYPASS  nov. 2011   cary [place]    Family History  Problem Relation Age of Onset  . Diabetes  Father   . Hypertension Father   . Cancer Father        prostate and mouth  . Sickle cell anemia Mother   . Anemia Mother   . Sickle cell trait Sister   . Hypertension Sister     Social History   Social History  . Marital status: Married    Spouse name: N/A  . Number of children: N/A  . Years of education: N/A   Occupational History  . Not on file.   Social History Main Topics  . Smoking status: Never Smoker  . Smokeless tobacco: Never Used  . Alcohol use No  . Drug use: No  . Sexual activity: Yes    Birth control/ protection: Surgical     Comment: TUBAL LIGATION   Other Topics Concern  . Not on file   Social History Narrative  . No narrative on file      Review of systems: Review of Systems  Constitutional: Negative for fever and chills.  HENT: Negative.   Eyes: Negative for blurred vision.  Respiratory: Negative for cough, shortness of breath and wheezing.   Cardiovascular: Negative for chest pain and palpitations.  Gastrointestinal: as per HPI Genitourinary: Negative for dysuria, urgency, frequency and hematuria.  Musculoskeletal: Negative for myalgias, back pain and joint pain.  Skin: Negative for itching and rash.  Neurological: Negative for dizziness, tremors, focal weakness, seizures and loss of consciousness.  Endo/Heme/Allergies: Positive for seasonal allergies.  Psychiatric/Behavioral: Negative for depression, suicidal ideas and hallucinations.  All other systems reviewed and are negative.   Physical Exam: Vitals:   01/09/17 1437  BP: 110/70  Pulse: 84   Body mass index is 41.19 kg/m. Gen:      No acute distress HEENT:  EOMI, sclera anicteric Neck:     No masses; no thyromegaly Lungs:    Clear to auscultation bilaterally; normal respiratory effort CV:         Regular rate and rhythm; no murmurs Abd:      + bowel sounds; soft, non-tender; no palpable masses, no distension Ext:    No edema; adequate peripheral perfusion Skin:      Warm  and dry; no rash Neuro: alert and oriented x 3 Psych: normal mood and affect  Data Reviewed:  Reviewed labs, radiology imaging, old records and pertinent past GI work up   Assessment and Plan/Recommendations: 40 yr F with IBS-D s/p infectious diarrhea here for follow up visit Symptoms improved significantly with Rifaximin and probiotic VSL#3 Start Benefiber 1 tablespoon BID with meals and titrate as needed If patient has recurrent episode of diarrhea or worsening symptoms, will consider colonoscopy with biopsies to further evaluate Return in 6 months or sooner if needed  15 minutes was spent face-to-face with the patient. Greater than 50% of the time used for counseling as well as treatment plan and follow-up. She had multiple questions which were answered to  her satisfaction  Iona Beard , MD [phone] Mon-Fri 8a-5p [phone] after 5p, weekends, holidays  CC: Rometta Emery, MD

## 2017-01-09 NOTE — Patient Instructions (Addendum)
Use Benefiber 1 tablespoon twice a day with meals   Follow up in 6 months

## 2017-03-27 DIAGNOSIS — J3089 Other allergic rhinitis: Secondary | ICD-10-CM | POA: Diagnosis not present

## 2017-03-27 DIAGNOSIS — H1045 Other chronic allergic conjunctivitis: Secondary | ICD-10-CM | POA: Diagnosis not present

## 2017-06-14 DIAGNOSIS — E139 Other specified diabetes mellitus without complications: Secondary | ICD-10-CM | POA: Diagnosis not present

## 2017-06-14 DIAGNOSIS — R05 Cough: Secondary | ICD-10-CM | POA: Diagnosis not present

## 2017-06-14 DIAGNOSIS — J019 Acute sinusitis, unspecified: Secondary | ICD-10-CM | POA: Diagnosis not present

## 2017-06-19 DIAGNOSIS — E113591 Type 2 diabetes mellitus with proliferative diabetic retinopathy without macular edema, right eye: Secondary | ICD-10-CM | POA: Diagnosis not present

## 2017-06-19 DIAGNOSIS — H2522 Age-related cataract, morgagnian type, left eye: Secondary | ICD-10-CM | POA: Diagnosis not present

## 2017-06-19 DIAGNOSIS — H2511 Age-related nuclear cataract, right eye: Secondary | ICD-10-CM | POA: Diagnosis not present

## 2017-06-26 DIAGNOSIS — D649 Anemia, unspecified: Secondary | ICD-10-CM | POA: Diagnosis not present

## 2017-06-26 DIAGNOSIS — E119 Type 2 diabetes mellitus without complications: Secondary | ICD-10-CM | POA: Diagnosis not present

## 2017-06-26 DIAGNOSIS — I10 Essential (primary) hypertension: Secondary | ICD-10-CM | POA: Diagnosis not present

## 2017-06-30 DIAGNOSIS — E060 Acute thyroiditis: Secondary | ICD-10-CM | POA: Diagnosis not present

## 2017-06-30 DIAGNOSIS — I10 Essential (primary) hypertension: Secondary | ICD-10-CM | POA: Diagnosis not present

## 2017-06-30 DIAGNOSIS — G63 Polyneuropathy in diseases classified elsewhere: Secondary | ICD-10-CM | POA: Diagnosis not present

## 2017-08-13 DIAGNOSIS — D649 Anemia, unspecified: Secondary | ICD-10-CM | POA: Diagnosis not present

## 2017-08-13 DIAGNOSIS — E119 Type 2 diabetes mellitus without complications: Secondary | ICD-10-CM | POA: Diagnosis not present

## 2017-08-13 DIAGNOSIS — I10 Essential (primary) hypertension: Secondary | ICD-10-CM | POA: Diagnosis not present

## 2017-08-13 DIAGNOSIS — G2581 Restless legs syndrome: Secondary | ICD-10-CM | POA: Diagnosis not present

## 2017-08-14 DIAGNOSIS — D509 Iron deficiency anemia, unspecified: Secondary | ICD-10-CM | POA: Diagnosis not present

## 2017-08-31 ENCOUNTER — Ambulatory Visit (HOSPITAL_COMMUNITY)
Admission: RE | Admit: 2017-08-31 | Discharge: 2017-08-31 | Disposition: A | Discharge status: Home / Self Care | Payer: 59 | Source: Ambulatory Visit | Attending: Internal Medicine | Admitting: Internal Medicine

## 2017-08-31 DIAGNOSIS — D509 Iron deficiency anemia, unspecified: Secondary | ICD-10-CM | POA: Insufficient documentation

## 2017-08-31 MED ORDER — IRON DEXTRAN 50 MG/ML IJ SOLN
1000.0000 mg | Freq: Once | INTRAMUSCULAR | Status: AC
  Administered 2017-08-31: 1000 mg via INTRAVENOUS
  Filled 2017-08-31: qty 20

## 2017-08-31 MED ORDER — SODIUM CHLORIDE 0.9 % IV SOLN
INTRAVENOUS | Status: DC
  Administered 2017-08-31: 09:00:00 via INTRAVENOUS

## 2017-08-31 MED ORDER — SODIUM CHLORIDE 0.9 % IV SOLN
25.0000 mg | Freq: Once | INTRAVENOUS | Status: AC
  Administered 2017-08-31: 25 mg via INTRAVENOUS
  Filled 2017-08-31: qty 0.5

## 2017-08-31 NOTE — Progress Notes (Signed)
PATIENT CARE CENTER NOTE  Diagnosis: Iron Deficiency Anemia    Provider: Dr. Earlie LouMohammad Garba    Procedure: Iron Dextran Complex (Infed) infusion   Note: Patient received iron infusion (Infed). Test dose and full dose given with no adverse reactions. Patient remained stable throughout infusion and vitals remained stable. Discharge instructions given to patient. Patient alert, oriented and ambulatory at discharge.

## 2017-08-31 NOTE — Discharge Instructions (Signed)

## 2017-11-06 DIAGNOSIS — H1045 Other chronic allergic conjunctivitis: Secondary | ICD-10-CM | POA: Diagnosis not present

## 2017-11-06 DIAGNOSIS — J3089 Other allergic rhinitis: Secondary | ICD-10-CM | POA: Diagnosis not present

## 2017-11-24 ENCOUNTER — Ambulatory Visit: Payer: 59 | Admitting: Women's Health

## 2017-11-24 ENCOUNTER — Encounter: Payer: Self-pay | Admitting: Women's Health

## 2017-11-24 VITALS — BP 118/78 | Ht 61.0 in | Wt 227.0 lb

## 2017-11-24 DIAGNOSIS — Z01419 Encounter for gynecological examination (general) (routine) without abnormal findings: Secondary | ICD-10-CM

## 2017-11-24 NOTE — Patient Instructions (Signed)
[facility]  [phone]  mammogram   Carbohydrate Counting for Diabetes Mellitus, Adult Carbohydrate counting is a method for keeping track of how many carbohydrates you eat. Eating carbohydrates naturally increases the amount of sugar (glucose) in the blood. Counting how many carbohydrates you eat helps keep your blood glucose within normal limits, which helps you manage your diabetes (diabetes mellitus). It is important to know how many carbohydrates you can safely have in each meal. This is different for every person. A diet and nutrition specialist (registered dietitian) can help you make a meal plan and calculate how many carbohydrates you should have at each meal and snack. Carbohydrates are found in the following foods:  Grains, such as breads and cereals.  Dried beans and soy products.  Starchy vegetables, such as potatoes, peas, and corn.  Fruit and fruit juices.  Milk and yogurt.  Sweets and snack foods, such as cake, cookies, candy, chips, and soft drinks.  How do I count carbohydrates? There are two ways to count carbohydrates in food. You can use either of the methods or a combination of both. Reading "Nutrition Facts" on packaged food The "Nutrition Facts" list is included on the labels of almost all packaged foods and beverages in the [place] It includes:  The serving size.  Information about nutrients in each serving, including the grams (g) of carbohydrate per serving.  To use the "Nutrition Facts":  Decide how many servings you will have.  Multiply the number of servings by the number of carbohydrates per serving.  The resulting number is the total amount of carbohydrates that you will be having.  Learning standard serving sizes of other foods When you eat foods containing carbohydrates that are not packaged or do not include "Nutrition Facts" on the label, you need to measure the servings in order to count the amount of carbohydrates:  Measure the foods that  you will eat with a food scale or measuring cup, if needed.  Decide how many standard-size servings you will eat.  Multiply the number of servings by 15. Most carbohydrate-rich foods have about 15 g of carbohydrates per serving. ? For example, if you eat 8 oz (170 g) of strawberries, you will have eaten 2 servings and 30 g of carbohydrates (2 servings x 15 g = 30 g).  For foods that have more than one food mixed, such as soups and casseroles, you must count the carbohydrates in each food that is included.  The following list contains standard serving sizes of common carbohydrate-rich foods. Each of these servings has about 15 g of carbohydrates:   hamburger bun or  English muffin.   oz (15 mL) syrup.   oz (14 g) jelly.  1 slice of bread.  1 six-inch tortilla.  3 oz (85 g) cooked rice or pasta.  4 oz (113 g) cooked dried beans.  4 oz (113 g) starchy vegetable, such as peas, corn, or potatoes.  4 oz (113 g) hot cereal.  4 oz (113 g) mashed potatoes or  of a large baked potato.  4 oz (113 g) canned or frozen fruit.  4 oz (120 mL) fruit juice.  4-6 crackers.  6 chicken nuggets.  6 oz (170 g) unsweetened dry cereal.  6 oz (170 g) plain fat-free yogurt or yogurt sweetened with artificial sweeteners.  8 oz (240 mL) milk.  8 oz (170 g) fresh fruit or one small piece of fruit.  24 oz (680 g) popped popcorn.  Example of carbohydrate counting Sample  meal  3 oz (85 g) chicken breast.  6 oz (170 g) brown rice.  4 oz (113 g) corn.  8 oz (240 mL) milk.  8 oz (170 g) strawberries with sugar-free whipped topping. Carbohydrate calculation 1. Identify the foods that contain carbohydrates: ? Rice. ? Corn. ? Milk. ? Strawberries. 2. Calculate how many servings you have of each food: ? 2 servings rice. ? 1 serving corn. ? 1 serving milk. ? 1 serving strawberries. 3. Multiply each number of servings by 15 g: ? 2 servings rice x 15 g = 30 g. ? 1 serving corn x  15 g = 15 g. ? 1 serving milk x 15 g = 15 g. ? 1 serving strawberries x 15 g = 15 g. 4. Add together all of the amounts to find the total grams of carbohydrates eaten: ? 30 g + 15 g + 15 g + 15 g = 75 g of carbohydrates total. This information is not intended to replace advice given to you by your health care provider. Make sure you discuss any questions you have with your health care provider. Document Released: 05/12/2005 Document Revised: 11/30/2015 Document Reviewed: 10/24/2015 Elsevier Interactive Patient Education  2018 [place] Maintenance, Female Adopting a healthy lifestyle and getting preventive care can go a long way to promote health and wellness. Talk with your health care provider about what schedule of regular examinations is right for you. This is a good chance for you to check in with your provider about disease prevention and staying healthy. In between checkups, there are plenty of things you can do on your own. Experts have done a lot of research about which lifestyle changes and preventive measures are most likely to keep you healthy. Ask your health care provider for more information. Weight and diet Eat a healthy diet  Be sure to include plenty of vegetables, fruits, low-fat dairy products, and lean protein.  Do not eat a lot of foods high in solid fats, added sugars, or salt.  Get regular exercise. This is one of the most important things you can do for your health. ? Most adults should exercise for at least 150 minutes each week. The exercise should increase your heart rate and make you sweat (moderate-intensity exercise). ? Most adults should also do strengthening exercises at least twice a week. This is in addition to the moderate-intensity exercise.  Maintain a healthy weight  Body mass index (BMI) is a measurement that can be used to identify possible weight problems. It estimates body fat based on height and weight. Your health care provider can help  determine your BMI and help you achieve or maintain a healthy weight.  For females 56 years of age and older: ? A BMI below 18.5 is considered underweight. ? A BMI of 18.5 to 24.9 is normal. ? A BMI of 25 to 29.9 is considered overweight. ? A BMI of 30 and above is considered obese.  Watch levels of cholesterol and blood lipids  You should start having your blood tested for lipids and cholesterol at 42 years of age, then have this test every 5 years.  You may need to have your cholesterol levels checked more often if: ? Your lipid or cholesterol levels are high. ? You are older than 42 years of age. ? You are at high risk for heart disease.  Cancer screening Lung Cancer  Lung cancer screening is recommended for adults 2-20 years old who are at high risk for lung  cancer because of a history of smoking.  A yearly low-dose CT scan of the lungs is recommended for people who: ? Currently smoke. ? Have quit within the past 15 years. ? Have at least a 30-pack-year history of smoking. A pack year is smoking an average of one pack of cigarettes a day for 1 year.  Yearly screening should continue until it has been 15 years since you quit.  Yearly screening should stop if you develop a health problem that would prevent you from having lung cancer treatment.  Breast Cancer  Practice breast self-awareness. This means understanding how your breasts normally appear and feel.  It also means doing regular breast self-exams. Let your health care provider know about any changes, no matter how small.  If you are in your 20s or 30s, you should have a clinical breast exam (CBE) by a health care provider every 1-3 years as part of a regular health exam.  If you are 24 or older, have a CBE every year. Also consider having a breast X-ray (mammogram) every year.  If you have a family history of breast cancer, talk to your health care provider about genetic screening.  If you are at high risk for  breast cancer, talk to your health care provider about having an MRI and a mammogram every year.  Breast cancer gene (BRCA) assessment is recommended for women who have family members with BRCA-related cancers. BRCA-related cancers include: ? Breast. ? Ovarian. ? Tubal. ? Peritoneal cancers.  Results of the assessment will determine the need for genetic counseling and BRCA1 and BRCA2 testing.  Cervical Cancer Your health care provider may recommend that you be screened regularly for cancer of the pelvic organs (ovaries, uterus, and vagina). This screening involves a pelvic examination, including checking for microscopic changes to the surface of your cervix (Pap test). You may be encouraged to have this screening done every 3 years, beginning at age 82.  For women ages 76-65, health care providers may recommend pelvic exams and Pap testing every 3 years, or they may recommend the Pap and pelvic exam, combined with testing for human papilloma virus (HPV), every 5 years. Some types of HPV increase your risk of cervical cancer. Testing for HPV may also be done on women of any age with unclear Pap test results.  Other health care providers may not recommend any screening for nonpregnant women who are considered low risk for pelvic cancer and who do not have symptoms. Ask your health care provider if a screening pelvic exam is right for you.  If you have had past treatment for cervical cancer or a condition that could lead to cancer, you need Pap tests and screening for cancer for at least 20 years after your treatment. If Pap tests have been discontinued, your risk factors (such as having a new sexual partner) need to be reassessed to determine if screening should resume. Some women have medical problems that increase the chance of getting cervical cancer. In these cases, your health care provider may recommend more frequent screening and Pap tests.  Colorectal Cancer  This type of cancer can be  detected and often prevented.  Routine colorectal cancer screening usually begins at 42 years of age and continues through 42 years of age.  Your health care provider may recommend screening at an earlier age if you have risk factors for colon cancer.  Your health care provider may also recommend using home test kits to check for hidden blood in the  stool.  A small camera at the end of a tube can be used to examine your colon directly (sigmoidoscopy or colonoscopy). This is done to check for the earliest forms of colorectal cancer.  Routine screening usually begins at age 28.  Direct examination of the colon should be repeated every 5-10 years through 41 years of age. However, you may need to be screened more often if early forms of precancerous polyps or small growths are found.  Skin Cancer  Check your skin from head to toe regularly.  Tell your health care provider about any new moles or changes in moles, especially if there is a change in a mole's shape or color.  Also tell your health care provider if you have a mole that is larger than the size of a pencil eraser.  Always use sunscreen. Apply sunscreen liberally and repeatedly throughout the day.  Protect yourself by wearing long sleeves, pants, a wide-brimmed hat, and sunglasses whenever you are outside.  Heart disease, diabetes, and high blood pressure  High blood pressure causes heart disease and increases the risk of stroke. High blood pressure is more likely to develop in: ? People who have blood pressure in the high end of the normal range (130-139/85-89 mm Hg). ? People who are overweight or obese. ? People who are African American.  If you are 7-3 years of age, have your blood pressure checked every 3-5 years. If you are 76 years of age or older, have your blood pressure checked every year. You should have your blood pressure measured twice-once when you are at a hospital or clinic, and once when you are not at a  hospital or clinic. Record the average of the two measurements. To check your blood pressure when you are not at a hospital or clinic, you can use: ? An automated blood pressure machine at a pharmacy. ? A home blood pressure monitor.  If you are between 7 years and 19 years old, ask your health care provider if you should take aspirin to prevent strokes.  Have regular diabetes screenings. This involves taking a blood sample to check your fasting blood sugar level. ? If you are at a normal weight and have a low risk for diabetes, have this test once every three years after 42 years of age. ? If you are overweight and have a high risk for diabetes, consider being tested at a younger age or more often. Preventing infection Hepatitis B  If you have a higher risk for hepatitis B, you should be screened for this virus. You are considered at high risk for hepatitis B if: ? You were born in a country where hepatitis B is common. Ask your health care provider which countries are considered high risk. ? Your parents were born in a high-risk country, and you have not been immunized against hepatitis B (hepatitis B vaccine). ? You have HIV or AIDS. ? You use needles to inject street drugs. ? You live with someone who has hepatitis B. ? You have had sex with someone who has hepatitis B. ? You get hemodialysis treatment. ? You take certain medicines for conditions, including cancer, organ transplantation, and autoimmune conditions.  Hepatitis C  Blood testing is recommended for: ? Everyone born from [birth year] through 1965. ? Anyone with known risk factors for hepatitis C.  Sexually transmitted infections (STIs)  You should be screened for sexually transmitted infections (STIs) including gonorrhea and chlamydia if: ? You are sexually active and are younger than  42 years of age. ? You are older than 42 years of age and your health care provider tells you that you are at risk for this type of  infection. ? Your sexual activity has changed since you were last screened and you are at an increased risk for chlamydia or gonorrhea. Ask your health care provider if you are at risk.  If you do not have HIV, but are at risk, it may be recommended that you take a prescription medicine daily to prevent HIV infection. This is called pre-exposure prophylaxis (PrEP). You are considered at risk if: ? You are sexually active and do not regularly use condoms or know the HIV status of your partner(s). ? You take drugs by injection. ? You are sexually active with a partner who has HIV.  Talk with your health care provider about whether you are at high risk of being infected with HIV. If you choose to begin PrEP, you should first be tested for HIV. You should then be tested every 3 months for as long as you are taking PrEP. Pregnancy  If you are premenopausal and you may become pregnant, ask your health care provider about preconception counseling.  If you may become pregnant, take 400 to 800 micrograms (mcg) of folic acid every day.  If you want to prevent pregnancy, talk to your health care provider about birth control (contraception). Osteoporosis and menopause  Osteoporosis is a disease in which the bones lose minerals and strength with aging. This can result in serious bone fractures. Your risk for osteoporosis can be identified using a bone density scan.  If you are 11 years of age or older, or if you are at risk for osteoporosis and fractures, ask your health care provider if you should be screened.  Ask your health care provider whether you should take a calcium or vitamin D supplement to lower your risk for osteoporosis.  Menopause may have certain physical symptoms and risks.  Hormone replacement therapy may reduce some of these symptoms and risks. Talk to your health care provider about whether hormone replacement therapy is right for you. Follow these instructions at home:  Schedule  regular health, dental, and eye exams.  Stay current with your immunizations.  Do not use any tobacco products including cigarettes, chewing tobacco, or electronic cigarettes.  If you are pregnant, do not drink alcohol.  If you are breastfeeding, limit how much and how often you drink alcohol.  Limit alcohol intake to no more than 1 drink per day for nonpregnant women. One drink equals 12 ounces of beer, 5 ounces of wine, or 1 ounces of hard liquor.  Do not use street drugs.  Do not share needles.  Ask your health care provider for help if you need support or information about quitting drugs.  Tell your health care provider if you often feel depressed.  Tell your health care provider if you have ever been abused or do not feel safe at home. This information is not intended to replace advice given to you by your health care provider. Make sure you discuss any questions you have with your health care provider. Document Released: 11/25/2010 Document Revised: 10/18/2015 Document Reviewed: 02/13/2015 Elsevier Interactive Patient Education  [employer].

## 2017-11-24 NOTE — Progress Notes (Signed)
Cyndie ChimeShannon F Latif 12/26/1975 [id number]    History:    Presents for annual exam with no complaints. Has not had mammogram. Regular cycles/BTL/Vasectomy. Same partner. Denies abnormal bleeding, discharge, or urinary symptoms. Normal pap history.  History of infertility, had 2 spontaneous pregnancies after adopting 2 children. 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  Past medical history, past surgical history, family history and social history were all reviewed and documented in the EPIC chart. Works at [employer] Husband doing well. Has 3 sons and 1 daughter (oldest). ([place], [place], [place]   ROS:  A ROS was performed and pertinent positives and negatives are included.  Exam:  Vitals:   11/24/17 1602  BP: 118/78  Weight: 227 lb (103 kg)  Height: 5\' 1"  (1.549 m)   Body mass index is 42.89 kg/m.   General appearance:  Normal Thyroid:  Symmetrical, normal in size, without palpable masses or nodularity. Respiratory  Auscultation:  Clear without wheezing or rhonchi Cardiovascular  Auscultation:  Regular rate, without rubs, murmurs or gallops  Edema/varicosities:  Not grossly evident Abdominal  Soft,nontender, without masses, guarding or rebound.  Liver/spleen:  No organomegaly noted  Hernia:  None appreciated  Skin  Inspection:  Grossly normal   Breasts: Examined lying and sitting.     Right: Without masses, retractions, discharge or axillary adenopathy.     Left: Without masses, retractions, discharge or axillary adenopathy. Gentitourinary   Inguinal/mons:  Normal without inguinal adenopathy  External genitalia:  Normal  BUS/Urethra/Skene's glands:  Normal  Vagina:  Normal  Cervix:  Normal  Uterus:  Normal in size, shape and contour.  Midline and mobile  Adnexa/parametria:     Rt: Without masses or tenderness.   Lt: Without masses or tenderness.  Anus and perineum: Normal  Digital rectal exam: Normal sphincter tone without  palpated masses or tenderness  Assessment/Plan:  42 y.o. MBF G2P2 + 2 adopted children for annual exam with no complaints.  Monthly cycles/BTL/Vasectomy Hypertension, diabetes mellitus type 1.5 on insulin, labs and meds managed by internal medicine Obesity, history of gastric bypass,   Plan: SBEs. Instructed to schedule mammogram,  [facility] information given.  Urinalysis sent to lab. Discussed BMI 42.9, weight gain 7 pounds within a year and need for low carb diet and exercise for weight loss.  2018 Pap  - HR HPV,  discussed new screening guidelines.  Harrington Challengerancy J Mee Macdonnell [facility]ontWHNP, 4:47 PM 11/24/2017

## 2017-11-25 ENCOUNTER — Other Ambulatory Visit: Payer: Self-pay | Admitting: Women's Health

## 2017-11-25 ENCOUNTER — Encounter (INDEPENDENT_AMBULATORY_CARE_PROVIDER_SITE_OTHER): Payer: Self-pay

## 2017-11-25 DIAGNOSIS — Z1231 Encounter for screening mammogram for malignant neoplasm of breast: Secondary | ICD-10-CM

## 2017-11-26 LAB — URINALYSIS, COMPLETE W/RFL CULTURE
BILIRUBIN URINE: NEGATIVE
Bacteria, UA: NONE SEEN /HPF
Hyaline Cast: NONE SEEN /LPF
KETONES UR: NEGATIVE
LEUKOCYTE ESTERASE: NEGATIVE
NITRITES URINE, INITIAL: NEGATIVE
PROTEIN: NEGATIVE
RBC / HPF: NONE SEEN /HPF (ref 0–2)
Specific Gravity, Urine: 1.015 (ref 1.001–1.03)
Squamous Epithelial / LPF: NONE SEEN /HPF (ref <=5)
pH: 5.5 (ref 5.0–8.0)

## 2017-11-26 LAB — URINE CULTURE
MICRO NUMBER:: 90792733
SPECIMEN QUALITY: ADEQUATE

## 2017-11-26 LAB — CULTURE INDICATED

## 2017-12-05 DIAGNOSIS — L0291 Cutaneous abscess, unspecified: Secondary | ICD-10-CM | POA: Diagnosis not present

## 2017-12-15 ENCOUNTER — Ambulatory Visit
Admission: RE | Admit: 2017-12-15 | Discharge: 2017-12-15 | Disposition: A | Discharge status: Home / Self Care | Payer: 59 | Source: Ambulatory Visit | Attending: Women's Health | Admitting: Women's Health

## 2017-12-15 DIAGNOSIS — Z1231 Encounter for screening mammogram for malignant neoplasm of breast: Secondary | ICD-10-CM

## 2017-12-16 ENCOUNTER — Encounter (INDEPENDENT_AMBULATORY_CARE_PROVIDER_SITE_OTHER): Payer: Self-pay

## 2017-12-17 DIAGNOSIS — R600 Localized edema: Secondary | ICD-10-CM | POA: Diagnosis not present

## 2017-12-17 DIAGNOSIS — I10 Essential (primary) hypertension: Secondary | ICD-10-CM | POA: Diagnosis not present

## 2017-12-17 DIAGNOSIS — E119 Type 2 diabetes mellitus without complications: Secondary | ICD-10-CM | POA: Diagnosis not present

## 2017-12-17 DIAGNOSIS — E162 Hypoglycemia, unspecified: Secondary | ICD-10-CM | POA: Diagnosis not present

## 2017-12-18 DIAGNOSIS — H2522 Age-related cataract, morgagnian type, left eye: Secondary | ICD-10-CM | POA: Diagnosis not present

## 2017-12-18 DIAGNOSIS — E113591 Type 2 diabetes mellitus with proliferative diabetic retinopathy without macular edema, right eye: Secondary | ICD-10-CM | POA: Diagnosis not present

## 2017-12-18 DIAGNOSIS — H2511 Age-related nuclear cataract, right eye: Secondary | ICD-10-CM | POA: Diagnosis not present

## 2017-12-18 DIAGNOSIS — D649 Anemia, unspecified: Secondary | ICD-10-CM | POA: Diagnosis not present

## 2017-12-23 DIAGNOSIS — M542 Cervicalgia: Secondary | ICD-10-CM | POA: Diagnosis not present

## 2017-12-23 DIAGNOSIS — R51 Headache: Secondary | ICD-10-CM | POA: Diagnosis not present

## 2017-12-23 DIAGNOSIS — R609 Edema, unspecified: Secondary | ICD-10-CM | POA: Diagnosis not present

## 2018-04-06 DIAGNOSIS — L219 Seborrheic dermatitis, unspecified: Secondary | ICD-10-CM | POA: Diagnosis not present

## 2018-04-06 DIAGNOSIS — L650 Telogen effluvium: Secondary | ICD-10-CM | POA: Diagnosis not present

## 2018-04-23 DIAGNOSIS — J310 Chronic rhinitis: Secondary | ICD-10-CM | POA: Diagnosis not present

## 2018-06-18 DIAGNOSIS — H1045 Other chronic allergic conjunctivitis: Secondary | ICD-10-CM | POA: Diagnosis not present

## 2018-06-18 DIAGNOSIS — J3089 Other allergic rhinitis: Secondary | ICD-10-CM | POA: Diagnosis not present

## 2018-06-25 DIAGNOSIS — H35371 Puckering of macula, right eye: Secondary | ICD-10-CM | POA: Diagnosis not present

## 2018-06-25 DIAGNOSIS — E113591 Type 2 diabetes mellitus with proliferative diabetic retinopathy without macular edema, right eye: Secondary | ICD-10-CM | POA: Diagnosis not present

## 2018-06-25 DIAGNOSIS — H2511 Age-related nuclear cataract, right eye: Secondary | ICD-10-CM | POA: Diagnosis not present

## 2019-02-04 DIAGNOSIS — E113591 Type 2 diabetes mellitus with proliferative diabetic retinopathy without macular edema, right eye: Secondary | ICD-10-CM | POA: Diagnosis not present

## 2019-02-04 DIAGNOSIS — H35371 Puckering of macula, right eye: Secondary | ICD-10-CM | POA: Diagnosis not present

## 2019-02-04 DIAGNOSIS — H2522 Age-related cataract, morgagnian type, left eye: Secondary | ICD-10-CM | POA: Diagnosis not present

## 2019-02-04 DIAGNOSIS — H2511 Age-related nuclear cataract, right eye: Secondary | ICD-10-CM | POA: Diagnosis not present

## 2019-03-08 ENCOUNTER — Other Ambulatory Visit: Payer: Self-pay | Admitting: Women's Health

## 2019-03-08 DIAGNOSIS — Z1231 Encounter for screening mammogram for malignant neoplasm of breast: Secondary | ICD-10-CM

## 2019-03-18 DIAGNOSIS — S0501XA Injury of conjunctiva and corneal abrasion without foreign body, right eye, initial encounter: Secondary | ICD-10-CM | POA: Diagnosis not present

## 2019-03-25 DIAGNOSIS — S0501XA Injury of conjunctiva and corneal abrasion without foreign body, right eye, initial encounter: Secondary | ICD-10-CM | POA: Diagnosis not present

## 2019-03-29 DIAGNOSIS — S0501XD Injury of conjunctiva and corneal abrasion without foreign body, right eye, subsequent encounter: Secondary | ICD-10-CM | POA: Diagnosis not present

## 2019-04-05 ENCOUNTER — Encounter: Payer: 59 | Admitting: Women's Health

## 2019-04-26 ENCOUNTER — Ambulatory Visit
Admission: RE | Admit: 2019-04-26 | Discharge: 2019-04-26 | Disposition: A | Discharge status: Home / Self Care | Payer: 59 | Source: Ambulatory Visit | Attending: Women's Health | Admitting: Women's Health

## 2019-04-26 ENCOUNTER — Other Ambulatory Visit: Payer: Self-pay

## 2019-04-26 DIAGNOSIS — Z1231 Encounter for screening mammogram for malignant neoplasm of breast: Secondary | ICD-10-CM | POA: Diagnosis not present

## 2019-05-03 DIAGNOSIS — H16223 Keratoconjunctivitis sicca, not specified as Sjogren's, bilateral: Secondary | ICD-10-CM | POA: Diagnosis not present

## 2019-05-03 DIAGNOSIS — H25012 Cortical age-related cataract, left eye: Secondary | ICD-10-CM | POA: Diagnosis not present

## 2019-05-03 DIAGNOSIS — H2513 Age-related nuclear cataract, bilateral: Secondary | ICD-10-CM | POA: Diagnosis not present

## 2019-05-10 DIAGNOSIS — E162 Hypoglycemia, unspecified: Secondary | ICD-10-CM | POA: Diagnosis not present

## 2019-05-10 DIAGNOSIS — E161 Other hypoglycemia: Secondary | ICD-10-CM | POA: Diagnosis not present

## 2019-05-10 DIAGNOSIS — R4020 Unspecified coma: Secondary | ICD-10-CM | POA: Diagnosis not present

## 2019-05-10 DIAGNOSIS — R404 Transient alteration of awareness: Secondary | ICD-10-CM | POA: Diagnosis not present

## 2019-05-13 DIAGNOSIS — E119 Type 2 diabetes mellitus without complications: Secondary | ICD-10-CM | POA: Diagnosis not present

## 2019-05-13 DIAGNOSIS — E785 Hyperlipidemia, unspecified: Secondary | ICD-10-CM | POA: Diagnosis not present

## 2019-06-07 ENCOUNTER — Encounter: Payer: Self-pay | Admitting: Women's Health

## 2019-06-17 DIAGNOSIS — L700 Acne vulgaris: Secondary | ICD-10-CM | POA: Diagnosis not present

## 2019-06-17 DIAGNOSIS — Z79899 Other long term (current) drug therapy: Secondary | ICD-10-CM | POA: Diagnosis not present

## 2019-06-24 DIAGNOSIS — L2089 Other atopic dermatitis: Secondary | ICD-10-CM | POA: Diagnosis not present

## 2019-06-24 DIAGNOSIS — H1045 Other chronic allergic conjunctivitis: Secondary | ICD-10-CM | POA: Diagnosis not present

## 2019-06-24 DIAGNOSIS — J3089 Other allergic rhinitis: Secondary | ICD-10-CM | POA: Diagnosis not present

## 2019-07-19 DIAGNOSIS — Z79899 Other long term (current) drug therapy: Secondary | ICD-10-CM | POA: Diagnosis not present

## 2019-08-04 DIAGNOSIS — Z23 Encounter for immunization: Secondary | ICD-10-CM | POA: Diagnosis not present

## 2019-08-12 DIAGNOSIS — H35371 Puckering of macula, right eye: Secondary | ICD-10-CM | POA: Diagnosis not present

## 2019-08-12 DIAGNOSIS — E113591 Type 2 diabetes mellitus with proliferative diabetic retinopathy without macular edema, right eye: Secondary | ICD-10-CM | POA: Diagnosis not present

## 2019-08-12 DIAGNOSIS — H2522 Age-related cataract, morgagnian type, left eye: Secondary | ICD-10-CM | POA: Diagnosis not present

## 2019-08-12 DIAGNOSIS — H2511 Age-related nuclear cataract, right eye: Secondary | ICD-10-CM | POA: Diagnosis not present

## 2019-09-03 DIAGNOSIS — Z23 Encounter for immunization: Secondary | ICD-10-CM | POA: Diagnosis not present

## 2019-09-16 DIAGNOSIS — L700 Acne vulgaris: Secondary | ICD-10-CM | POA: Diagnosis not present

## 2019-09-16 DIAGNOSIS — Z79899 Other long term (current) drug therapy: Secondary | ICD-10-CM | POA: Diagnosis not present

## 2019-10-13 DIAGNOSIS — L709 Acne, unspecified: Secondary | ICD-10-CM | POA: Diagnosis not present

## 2019-11-17 DIAGNOSIS — L700 Acne vulgaris: Secondary | ICD-10-CM | POA: Diagnosis not present

## 2020-02-10 DIAGNOSIS — H2522 Age-related cataract, morgagnian type, left eye: Secondary | ICD-10-CM | POA: Diagnosis not present

## 2020-02-10 DIAGNOSIS — H2511 Age-related nuclear cataract, right eye: Secondary | ICD-10-CM | POA: Diagnosis not present

## 2020-02-10 DIAGNOSIS — E113591 Type 2 diabetes mellitus with proliferative diabetic retinopathy without macular edema, right eye: Secondary | ICD-10-CM | POA: Diagnosis not present

## 2020-02-10 DIAGNOSIS — H35371 Puckering of macula, right eye: Secondary | ICD-10-CM | POA: Diagnosis not present

## 2020-05-01 ENCOUNTER — Other Ambulatory Visit: Payer: Self-pay | Admitting: Nurse Practitioner

## 2020-05-01 DIAGNOSIS — Z1231 Encounter for screening mammogram for malignant neoplasm of breast: Secondary | ICD-10-CM

## 2020-06-13 ENCOUNTER — Inpatient Hospital Stay: Admission: RE | Admit: 2020-06-13 | Payer: BC Managed Care – PPO | Source: Ambulatory Visit

## 2020-06-29 DIAGNOSIS — H1045 Other chronic allergic conjunctivitis: Secondary | ICD-10-CM | POA: Diagnosis not present

## 2020-06-29 DIAGNOSIS — L209 Atopic dermatitis, unspecified: Secondary | ICD-10-CM | POA: Diagnosis not present

## 2020-06-29 DIAGNOSIS — J3089 Other allergic rhinitis: Secondary | ICD-10-CM | POA: Diagnosis not present

## 2020-07-03 DIAGNOSIS — Z13220 Encounter for screening for lipoid disorders: Secondary | ICD-10-CM | POA: Diagnosis not present

## 2020-07-03 DIAGNOSIS — E119 Type 2 diabetes mellitus without complications: Secondary | ICD-10-CM | POA: Diagnosis not present

## 2020-07-13 DIAGNOSIS — M25512 Pain in left shoulder: Secondary | ICD-10-CM | POA: Diagnosis not present

## 2020-08-07 ENCOUNTER — Ambulatory Visit: Payer: BC Managed Care – PPO

## 2020-08-16 ENCOUNTER — Ambulatory Visit
Admission: RE | Admit: 2020-08-16 | Discharge: 2020-08-16 | Disposition: A | Discharge status: Home / Self Care | Payer: BC Managed Care – PPO | Source: Ambulatory Visit | Attending: Nurse Practitioner | Admitting: Nurse Practitioner

## 2020-08-16 ENCOUNTER — Other Ambulatory Visit: Payer: Self-pay

## 2020-08-16 DIAGNOSIS — Z1231 Encounter for screening mammogram for malignant neoplasm of breast: Secondary | ICD-10-CM | POA: Diagnosis not present

## 2020-08-22 ENCOUNTER — Ambulatory Visit (INDEPENDENT_AMBULATORY_CARE_PROVIDER_SITE_OTHER): Payer: BC Managed Care – PPO | Admitting: Nurse Practitioner

## 2020-08-22 ENCOUNTER — Other Ambulatory Visit: Payer: Self-pay

## 2020-08-22 ENCOUNTER — Encounter: Payer: Self-pay | Admitting: Nurse Practitioner

## 2020-08-22 VITALS — BP 122/78 | Ht 61.0 in | Wt 217.0 lb

## 2020-08-22 DIAGNOSIS — Z01419 Encounter for gynecological examination (general) (routine) without abnormal findings: Secondary | ICD-10-CM | POA: Diagnosis not present

## 2020-08-22 DIAGNOSIS — N926 Irregular menstruation, unspecified: Secondary | ICD-10-CM | POA: Diagnosis not present

## 2020-08-22 NOTE — Patient Instructions (Signed)
Health Maintenance, Female Adopting a healthy lifestyle and getting preventive care are important in promoting health and wellness. Ask your health care provider about:  The right schedule for you to have regular tests and exams.  Things you can do on your own to prevent diseases and keep yourself healthy. What should I know about diet, weight, and exercise? Eat a healthy diet  Eat a diet that includes plenty of vegetables, fruits, low-fat dairy products, and lean protein.  Do not eat a lot of foods that are high in solid fats, added sugars, or sodium.   Maintain a healthy weight Body mass index (BMI) is used to identify weight problems. It estimates body fat based on height and weight. Your health care provider can help determine your BMI and help you achieve or maintain a healthy weight. Get regular exercise Get regular exercise. This is one of the most important things you can do for your health. Most adults should:  Exercise for at least 150 minutes each week. The exercise should increase your heart rate and make you sweat (moderate-intensity exercise).  Do strengthening exercises at least twice a week. This is in addition to the moderate-intensity exercise.  Spend less time sitting. Even light physical activity can be beneficial. Watch cholesterol and blood lipids Have your blood tested for lipids and cholesterol at 45 years of age, then have this test every 5 years. Have your cholesterol levels checked more often if:  Your lipid or cholesterol levels are high.  You are older than 45 years of age.  You are at high risk for heart disease. What should I know about cancer screening? Depending on your health history and family history, you may need to have cancer screening at various ages. This may include screening for:  Breast cancer.  Cervical cancer.  Colorectal cancer.  Skin cancer.  Lung cancer. What should I know about heart disease, diabetes, and high blood  pressure? Blood pressure and heart disease  High blood pressure causes heart disease and increases the risk of stroke. This is more likely to develop in people who have high blood pressure readings, are of African descent, or are overweight.  Have your blood pressure checked: ? Every 3-5 years if you are 18-39 years of age. ? Every year if you are 40 years old or older. Diabetes Have regular diabetes screenings. This checks your fasting blood sugar level. Have the screening done:  Once every three years after age 40 if you are at a normal weight and have a low risk for diabetes.  More often and at a younger age if you are overweight or have a high risk for diabetes. What should I know about preventing infection? Hepatitis B If you have a higher risk for hepatitis B, you should be screened for this virus. Talk with your health care provider to find out if you are at risk for hepatitis B infection. Hepatitis C Testing is recommended for:  Everyone born from 1945 through 1965.  Anyone with known risk factors for hepatitis C. Sexually transmitted infections (STIs)  Get screened for STIs, including gonorrhea and chlamydia, if: ? You are sexually active and are younger than 45 years of age. ? You are older than 45 years of age and your health care provider tells you that you are at risk for this type of infection. ? Your sexual activity has changed since you were last screened, and you are at increased risk for chlamydia or gonorrhea. Ask your health care provider   if you are at risk.  Ask your health care provider about whether you are at high risk for HIV. Your health care provider may recommend a prescription medicine to help prevent HIV infection. If you choose to take medicine to prevent HIV, you should first get tested for HIV. You should then be tested every 3 months for as long as you are taking the medicine. Pregnancy  If you are about to stop having your period (premenopausal) and  you may become pregnant, seek counseling before you get pregnant.  Take 400 to 800 micrograms (mcg) of folic acid every day if you become pregnant.  Ask for birth control (contraception) if you want to prevent pregnancy. Osteoporosis and menopause Osteoporosis is a disease in which the bones lose minerals and strength with aging. This can result in bone fractures. If you are 65 years old or older, or if you are at risk for osteoporosis and fractures, ask your health care provider if you should:  Be screened for bone loss.  Take a calcium or vitamin D supplement to lower your risk of fractures.  Be given hormone replacement therapy (HRT) to treat symptoms of menopause. Follow these instructions at home: Lifestyle  Do not use any products that contain nicotine or tobacco, such as cigarettes, e-cigarettes, and chewing tobacco. If you need help quitting, ask your health care provider.  Do not use street drugs.  Do not share needles.  Ask your health care provider for help if you need support or information about quitting drugs. Alcohol use  Do not drink alcohol if: ? Your health care provider tells you not to drink. ? You are pregnant, may be pregnant, or are planning to become pregnant.  If you drink alcohol: ? Limit how much you use to 0-1 drink a day. ? Limit intake if you are breastfeeding.  Be aware of how much alcohol is in your drink. In the [place] one drink equals one 12 oz bottle of beer (355 mL), one 5 oz glass of wine (148 mL), or one 1 oz glass of hard liquor (44 mL). General instructions  Schedule regular health, dental, and eye exams.  Stay current with your vaccines.  Tell your health care provider if: ? You often feel depressed. ? You have ever been abused or do not feel safe at home. Summary  Adopting a healthy lifestyle and getting preventive care are important in promoting health and wellness.  Follow your health care provider's instructions about healthy  diet, exercising, and getting tested or screened for diseases.  Follow your health care provider's instructions on monitoring your cholesterol and blood pressure. This information is not intended to replace advice given to you by your health care provider. Make sure you discuss any questions you have with your health care provider. Document Revised: 05/05/2018 Document Reviewed: 05/05/2018 Elsevier Patient Education  2021 Elsevier Inc.  

## 2020-08-22 NOTE — Addendum Note (Signed)
Addended by: Dayna Barker on: 08/22/2020 01:44 PM   Modules accepted: Orders

## 2020-08-22 NOTE — Progress Notes (Signed)
   Melissa Braun [birth year] [id number]   History:  45 y.o. [id number]+1 presents for annual exam. Her cycles have started to become irregular. Her first missed cycle was last June, then she had 2 menses in July, and since then her cycles range from twice per month to every couple of months. The bleeding is light. She denies menopausal symptoms. BTL/vasectomy. Normal pap and mammogram history. Diabetes managed by PCP.   Gynecologic History Patient's last menstrual period was 07/27/2020. Period Cycle (Days): 28 (Sometimes 2 periods in a month) Period Duration (Days): 7 Period Pattern: (!) Irregular Menstrual Flow: Light Dysmenorrhea: (!) Mild Dysmenorrhea Symptoms: Cramping Contraception/Family planning: tubal ligation and vasectomy  Health Maintenance Last Pap: 11/03/2016. Results were: ASCUS negative HPV Last mammogram: 08/16/2020. Results were: normal Last colonoscopy: Never Last Dexa: N/A  Past medical history, past surgical history, family history and social history were all reviewed and documented in the EPIC chart. Married. 2 biological children and 2 adopted.  ROS:  A ROS was performed and pertinent positives and negatives are included.  Exam:  Vitals:   08/22/20 1159  BP: 122/78  Weight: 217 lb (98.4 kg)  Height: 5\' 1"  (1.549 m)   Body mass index is 41 kg/m.  General appearance:  Normal Thyroid:  Symmetrical, normal in size, without palpable masses or nodularity. Respiratory  Auscultation:  Clear without wheezing or rhonchi Cardiovascular  Auscultation:  Regular rate, without rubs, murmurs or gallops  Edema/varicosities:  Not grossly evident Abdominal  Soft,nontender, without masses, guarding or rebound.  Liver/spleen:  No organomegaly noted  Hernia:  None appreciated  Skin  Inspection:  Grossly normal   Breasts: Examined lying and sitting.   Right: Without masses, retractions, discharge or axillary adenopathy.   Left: Without masses, retractions, discharge or  axillary adenopathy. Gentitourinary   Inguinal/mons:  Normal without inguinal adenopathy  External genitalia:  Normal  BUS/Urethra/Skene's glands:  Normal  Vagina:  Normal  Cervix:  Normal  Uterus: Difficult to palpate due to body habitus but no gross masses or tenderness  Adnexa/parametria:     Rt: Without masses or tenderness.   Lt: Without masses or tenderness.  Anus and perineum: Normal  Digital rectal exam: Normal sphincter tone without palpated masses or tenderness  Assessment/Plan:  45 y.o. 59 for annual exam.   Well female exam with routine gynecological exam - Education provided on SBEs, importance of preventative screenings, current guidelines, high calcium diet, regular exercise, and multivitamin daily. Labs with PCP.   Irregular menses - Her cycles have started to become irregular. Her first missed cycle was last June, she then had 2 menses in July, and since then her cycles range from twice per month to every couple of months. The bleeding is light. She denies menopausal symptoms. We discussed that it is common for cycles to begin to change in her 74s and she is very likely going through perimenopause. Bleeding is not bothersome, but she is aware that if it becomes bothersome we can help regulate bleeding with progestin-only pill.  Screening for cervical cancer - 10/2016 ASCUS negative HPV, otherwise normal pap history. Pap with reflex today.   Screening for breast cancer - Normal mammogram history.  Continue annual screenings.  Normal breast exam today.  Return in 1 year for annual.    11/2016 DNP, 12:32 PM 08/22/2020

## 2020-08-23 DIAGNOSIS — L309 Dermatitis, unspecified: Secondary | ICD-10-CM | POA: Diagnosis not present

## 2020-08-27 LAB — PAP IG W/ RFLX HPV ASCU

## 2020-09-04 DIAGNOSIS — H2522 Age-related cataract, morgagnian type, left eye: Secondary | ICD-10-CM | POA: Diagnosis not present

## 2020-09-04 DIAGNOSIS — E113591 Type 2 diabetes mellitus with proliferative diabetic retinopathy without macular edema, right eye: Secondary | ICD-10-CM | POA: Diagnosis not present

## 2020-09-04 DIAGNOSIS — H35371 Puckering of macula, right eye: Secondary | ICD-10-CM | POA: Diagnosis not present

## 2020-11-01 DIAGNOSIS — L309 Dermatitis, unspecified: Secondary | ICD-10-CM | POA: Diagnosis not present

## 2020-11-01 DIAGNOSIS — L700 Acne vulgaris: Secondary | ICD-10-CM | POA: Diagnosis not present

## 2021-03-05 DIAGNOSIS — E113591 Type 2 diabetes mellitus with proliferative diabetic retinopathy without macular edema, right eye: Secondary | ICD-10-CM | POA: Diagnosis not present

## 2021-03-05 DIAGNOSIS — H2511 Age-related nuclear cataract, right eye: Secondary | ICD-10-CM | POA: Diagnosis not present

## 2021-03-05 DIAGNOSIS — H35371 Puckering of macula, right eye: Secondary | ICD-10-CM | POA: Diagnosis not present

## 2021-03-05 DIAGNOSIS — H2522 Age-related cataract, morgagnian type, left eye: Secondary | ICD-10-CM | POA: Diagnosis not present

## 2021-03-19 DIAGNOSIS — H25012 Cortical age-related cataract, left eye: Secondary | ICD-10-CM | POA: Diagnosis not present

## 2021-03-19 DIAGNOSIS — H16223 Keratoconjunctivitis sicca, not specified as Sjogren's, bilateral: Secondary | ICD-10-CM | POA: Diagnosis not present

## 2021-03-19 DIAGNOSIS — H16143 Punctate keratitis, bilateral: Secondary | ICD-10-CM | POA: Diagnosis not present

## 2021-03-19 DIAGNOSIS — H2513 Age-related nuclear cataract, bilateral: Secondary | ICD-10-CM | POA: Diagnosis not present

## 2021-07-16 ENCOUNTER — Other Ambulatory Visit: Payer: Self-pay | Admitting: Nurse Practitioner

## 2021-07-16 DIAGNOSIS — Z1231 Encounter for screening mammogram for malignant neoplasm of breast: Secondary | ICD-10-CM

## 2021-07-23 DIAGNOSIS — H16223 Keratoconjunctivitis sicca, not specified as Sjogren's, bilateral: Secondary | ICD-10-CM | POA: Diagnosis not present

## 2021-08-12 ENCOUNTER — Other Ambulatory Visit: Payer: Self-pay

## 2021-08-12 ENCOUNTER — Ambulatory Visit: Payer: BC Managed Care – PPO | Admitting: Podiatry

## 2021-08-12 DIAGNOSIS — L600 Ingrowing nail: Secondary | ICD-10-CM | POA: Diagnosis not present

## 2021-08-12 NOTE — Progress Notes (Signed)
? ?  HPI: 46 y.o. female presenting today as a new patient for evaluation of discoloration with thickening and peeling of her bilateral great toenails.  Patient states that this all began when she received a pedicure locally.  She began to notice discoloration and half of the right great toenail actually came off.  She presents for further treatment and evaluation ? ?No past medical history on file. ? ?Allergies  ?Allergen Reactions  ? Nitrofurantoin Nausea Only  ?  Other reaction(s): GI Upset (intolerance)  ? ?  ?Physical Exam: ?General: The patient is alert and oriented x3 in no acute distress. ? ?Dermatology: Hyperkeratotic dystrophic nails noted to the bilateral great toes with partial loss of the right great toe.  There is also some associated tenderness to palpation. ? ?Vascular: Palpable pedal pulses bilaterally. Capillary refill within normal limits.  Negative for any significant edema or erythema ? ?Neurological: Light touch and protective threshold grossly intact ? ?Musculoskeletal Exam: No pedal deformities noted ? ?Assessment: ?1.  Onychomycotic dystrophic nails bilateral great toes ? ? ?Plan of Care:  ?1. Patient evaluated.  ?2.  Today we discussed different treatment options for the patient including treatment for the fungal nails.  After discussing with the patient we did decide it may be best just to have the nails temporarily removed in toto and allow new nails for to grow in.  The patient would like to proceed with this plan of care ?3.  The nails were prepped in aseptic manner and digital block performed using 3 mL of 2% lidocaine plain.  The nails were avulsed in their entirety and light dressing applied.  Post care instructions provided. ?4.  Explained to the patient that we will allow the nails to grow out over the next 9-12 months.  ?5.  Return to clinic as needed ?  ?  ?Felecia Shelling, DPM ?[facility] ? ?Dr. Felecia Shelling, DPM  ?  ?[address]                                         ?[place], [place] [address]                ?Office [phone]  ?Fax [phone] ? ?

## 2021-08-19 ENCOUNTER — Ambulatory Visit
Admission: RE | Admit: 2021-08-19 | Discharge: 2021-08-19 | Disposition: A | Discharge status: Home / Self Care | Payer: BC Managed Care – PPO | Source: Ambulatory Visit | Attending: Nurse Practitioner | Admitting: Nurse Practitioner

## 2021-08-19 ENCOUNTER — Other Ambulatory Visit: Payer: Self-pay

## 2021-08-19 DIAGNOSIS — Z1231 Encounter for screening mammogram for malignant neoplasm of breast: Secondary | ICD-10-CM | POA: Diagnosis not present

## 2021-08-22 DIAGNOSIS — L700 Acne vulgaris: Secondary | ICD-10-CM | POA: Diagnosis not present

## 2021-09-05 ENCOUNTER — Ambulatory Visit: Payer: BC Managed Care – PPO | Admitting: Nurse Practitioner

## 2021-09-25 ENCOUNTER — Ambulatory Visit (INDEPENDENT_AMBULATORY_CARE_PROVIDER_SITE_OTHER): Payer: BC Managed Care – PPO | Admitting: Nurse Practitioner

## 2021-09-25 ENCOUNTER — Encounter: Payer: Self-pay | Admitting: Nurse Practitioner

## 2021-09-25 VITALS — BP 124/78 | Ht 61.0 in | Wt 230.0 lb

## 2021-09-25 DIAGNOSIS — Z01419 Encounter for gynecological examination (general) (routine) without abnormal findings: Secondary | ICD-10-CM | POA: Diagnosis not present

## 2021-09-25 NOTE — Patient Instructions (Signed)
Schedule Colonoscopy! ?[facility] ?[phone] ?[address] [place], [place] [address] ? ?

## 2021-09-25 NOTE — Progress Notes (Signed)
? ?  Melissa Braun 03-03-1976 [id number] ? ? ?History:  46 y.o. [id number]+7 presents for annual exam. Monthly cycles. 2008 LGSIL, 2021 ASCUS negative HPV. Diabetes managed by PCP.  ? ?Gynecologic History ?Patient's last menstrual period was 09/11/2021. ?Period Cycle (Days): 28 ?Period Duration (Days): 6 ?Period Pattern: Regular ?Menstrual Flow: Light ?Dysmenorrhea: (!) Mild ?Dysmenorrhea Symptoms: Cramping ?Contraception/Family planning: tubal ligation and vasectomy ?Sexually active: Yes ? ?Health Maintenance ?Last Pap: 08/22/2020. Results were: Normal, 3-year repeat ?Last mammogram: 08/19/2021. Results were: Normal ?Last colonoscopy: Never ?Last Dexa: N/A ? ?Past medical history, past surgical history, family history and social history were all reviewed and documented in the EPIC chart. Married. 2 biological children and 2 adopted. Working remote for [employer].  ? ?ROS:  A ROS was performed and pertinent positives and negatives are included. ? ?Exam: ? ?Vitals:  ? 09/25/21 1607  ?BP: 124/78  ?Weight: 230 lb (104.3 kg)  ?Height: 5\' 1"  (1.549 m)  ? ? ?Body mass index is 43.46 kg/m?. ? ?General appearance:  Normal ?Thyroid:  Symmetrical, normal in size, without palpable masses or nodularity. ?Respiratory ? Auscultation:  Clear without wheezing or rhonchi ?Cardiovascular ? Auscultation:  Regular rate, without rubs, murmurs or gallops ? Edema/varicosities:  Not grossly evident ?Abdominal ? Soft,nontender, without masses, guarding or rebound. ? Liver/spleen:  No organomegaly noted ? Hernia:  None appreciated ? Skin ? Inspection:  Grossly normal ?  ?Breasts: Examined lying and sitting.  ? Right: Without masses, retractions, discharge or axillary adenopathy. ? ? Left: Without masses, retractions, discharge or axillary adenopathy. ?Genitourinary  ? Inguinal/mons:  Normal without inguinal adenopathy ? External genitalia:  Normal appearing vulva with no masses, tenderness, or lesions ? BUS/Urethra/Skene's glands:  Normal ? Vagina:   Normal appearing with normal color and discharge, no lesions ? Cervix:  Normal appearing without discharge or lesions ? Uterus:  Normal in size, shape and contour.  Midline and mobile, nontender ? Adnexa/parametria:   ?  Rt: Normal in size, without masses or tenderness. ?  Lt: Normal in size, without masses or tenderness. ? Anus and perineum: Normal ? Digital rectal exam: Normal sphincter tone without palpated masses or tenderness ? ?Patient informed chaperone available to be present for breast and pelvic exam. Patient has requested no chaperone to be present. Patient has been advised what will be completed during breast and pelvic exam.  ? ?Assessment/Plan:  46 y.o. 46 for annual exam.  ? ?Well female exam with routine gynecological exam - Education provided on SBEs, importance of preventative screenings, current guidelines, high calcium diet, regular exercise, and multivitamin daily. Labs with PCP.  ? ?Screening for cervical cancer - 2008 LGSIL, 10/2016 ASCUS negative HPV, otherwise normal pap history. Will repeat at 3-year interval per guidelines. ? ?Screening for breast cancer - Normal mammogram history.  Continue annual screenings.  Normal breast exam today. ? ?Screening for colon cancer - Discussed current guidelines and importance of preventative screenings. Information provided on [facility] and she will schedule colonoscopy.  ? ?Return in 1 year for annual.  ? ? ?11/2016 DNP, 4:33 PM 09/25/2021 ? ?

## 2021-10-15 DIAGNOSIS — H16141 Punctate keratitis, right eye: Secondary | ICD-10-CM | POA: Diagnosis not present

## 2021-10-15 DIAGNOSIS — H16223 Keratoconjunctivitis sicca, not specified as Sjogren's, bilateral: Secondary | ICD-10-CM | POA: Diagnosis not present

## 2021-12-17 DIAGNOSIS — H16141 Punctate keratitis, right eye: Secondary | ICD-10-CM | POA: Diagnosis not present

## 2021-12-17 DIAGNOSIS — H16223 Keratoconjunctivitis sicca, not specified as Sjogren's, bilateral: Secondary | ICD-10-CM | POA: Diagnosis not present

## 2022-05-26 HISTORY — PX: CATARACT EXTRACTION: SUR2

## 2022-06-11 DIAGNOSIS — L2084 Intrinsic (allergic) eczema: Secondary | ICD-10-CM | POA: Diagnosis not present

## 2022-06-11 DIAGNOSIS — L700 Acne vulgaris: Secondary | ICD-10-CM | POA: Diagnosis not present

## 2022-07-02 DIAGNOSIS — H2513 Age-related nuclear cataract, bilateral: Secondary | ICD-10-CM | POA: Diagnosis not present

## 2022-07-02 DIAGNOSIS — E113591 Type 2 diabetes mellitus with proliferative diabetic retinopathy without macular edema, right eye: Secondary | ICD-10-CM | POA: Diagnosis not present

## 2022-07-02 DIAGNOSIS — H35371 Puckering of macula, right eye: Secondary | ICD-10-CM | POA: Diagnosis not present

## 2022-07-30 DIAGNOSIS — E113591 Type 2 diabetes mellitus with proliferative diabetic retinopathy without macular edema, right eye: Secondary | ICD-10-CM | POA: Diagnosis not present

## 2022-07-30 DIAGNOSIS — H25811 Combined forms of age-related cataract, right eye: Secondary | ICD-10-CM | POA: Diagnosis not present

## 2022-07-30 DIAGNOSIS — H2522 Age-related cataract, morgagnian type, left eye: Secondary | ICD-10-CM | POA: Diagnosis not present

## 2022-07-30 DIAGNOSIS — H35371 Puckering of macula, right eye: Secondary | ICD-10-CM | POA: Diagnosis not present

## 2022-08-21 DIAGNOSIS — H2511 Age-related nuclear cataract, right eye: Secondary | ICD-10-CM | POA: Diagnosis not present

## 2022-08-28 DIAGNOSIS — H1045 Other chronic allergic conjunctivitis: Secondary | ICD-10-CM | POA: Diagnosis not present

## 2022-08-28 DIAGNOSIS — L2089 Other atopic dermatitis: Secondary | ICD-10-CM | POA: Diagnosis not present

## 2022-08-28 DIAGNOSIS — J3089 Other allergic rhinitis: Secondary | ICD-10-CM | POA: Diagnosis not present

## 2022-09-25 ENCOUNTER — Ambulatory Visit
Admission: RE | Admit: 2022-09-25 | Discharge: 2022-09-25 | Disposition: A | Discharge status: Home / Self Care | Payer: BC Managed Care – PPO | Source: Ambulatory Visit | Attending: Nurse Practitioner | Admitting: Nurse Practitioner

## 2022-09-25 ENCOUNTER — Other Ambulatory Visit: Payer: Self-pay | Admitting: Nurse Practitioner

## 2022-09-25 DIAGNOSIS — Z1231 Encounter for screening mammogram for malignant neoplasm of breast: Secondary | ICD-10-CM

## 2022-10-23 ENCOUNTER — Telehealth: Payer: Self-pay

## 2022-10-23 NOTE — Telephone Encounter (Signed)
Pt LVM in triage line reporting not having cycle since 09/2021 and states since ~Mother's day week has been having intermittent vaginal bleeding and is wondering if this is something she should be concerned about? Please advise. OV for eval? Lab visit for [facility]? Pt currently scheduled for AEX on 12/31/2022.

## 2022-10-23 NOTE — Telephone Encounter (Signed)
OV recommended. Thanks.

## 2022-10-23 NOTE — Telephone Encounter (Signed)
Spoke with patient. Patient reports bleeding May 2023 and bleeding again May 2024, denies any other GYN symptoms. OV scheduled for 6/3 at 1545 with Tiffany, NP.   Routing to provider for final review. Patient is agreeable to disposition. Will close encounter.

## 2022-10-27 ENCOUNTER — Ambulatory Visit (INDEPENDENT_AMBULATORY_CARE_PROVIDER_SITE_OTHER): Payer: BC Managed Care – PPO | Admitting: Nurse Practitioner

## 2022-10-27 VITALS — BP 116/72 | HR 69 | Wt 212.0 lb

## 2022-10-27 DIAGNOSIS — Z8349 Family history of other endocrine, nutritional and metabolic diseases: Secondary | ICD-10-CM | POA: Diagnosis not present

## 2022-10-27 DIAGNOSIS — N912 Amenorrhea, unspecified: Secondary | ICD-10-CM

## 2022-10-27 DIAGNOSIS — E139 Other specified diabetes mellitus without complications: Secondary | ICD-10-CM | POA: Diagnosis not present

## 2022-10-27 DIAGNOSIS — N950 Postmenopausal bleeding: Secondary | ICD-10-CM

## 2022-10-27 DIAGNOSIS — I10 Essential (primary) hypertension: Secondary | ICD-10-CM | POA: Diagnosis not present

## 2022-10-27 NOTE — Progress Notes (Signed)
   Acute Office Visit  Subjective:    Patient ID: Melissa Braun, female    DOB: June 25, 1975, 47 y.o.   MRN: [id number]   HPI 47 y.o. [id number] presents today for vaginal bleeding. Began having light vaginal bleeding 10/01/22-10/07/22, then again 5/27-5/30, accompanied by cramping. Bleeding was enough to use panty liners. Prior to this, LMP was in April 2023. Denies menopausal symptoms. Sister with thyroid disease.   No LMP recorded.    Review of Systems  Constitutional: Negative.   Endocrine: Negative for cold intolerance and heat intolerance.  Genitourinary:  Positive for vaginal bleeding.       Objective:    Physical Exam Constitutional:      Appearance: Normal appearance.  Genitourinary:    General: Normal vulva.     Vagina: Normal.     Cervix: Normal.     Uterus: Normal.      BP 116/72   Pulse 69   Wt 212 lb (96.2 kg)   SpO2 100%   BMI 40.06 kg/m  Wt Readings from Last 3 Encounters:  10/27/22 212 lb (96.2 kg)  09/25/21 230 lb (104.3 kg)  08/22/20 217 lb (98.4 kg)        Patient informed chaperone available to be present for breast and/or pelvic exam. Patient has requested no chaperone to be present. Patient has been advised what will be completed during breast and pelvic exam.   Assessment & Plan:   Problem List Items Addressed This Visit   None Visit Diagnoses     Postmenopausal bleeding    -  Primary   Relevant Orders   Estradiol   Follicle stimulating hormone   Family history of thyroid disease in sister       Relevant Orders   TSH   Amenorrhea       Relevant Orders   TSH      Plan: TSH, estradiol, FSH today. If menopausal will schedule pelvic ultrasound.      Olivia Mackie DNP, 4:02 PM 10/27/2022

## 2022-10-28 LAB — FOLLICLE STIMULATING HORMONE: FSH: 23 m[IU]/mL

## 2022-10-28 LAB — ESTRADIOL: Estradiol: 23 pg/mL

## 2022-10-28 LAB — TSH: TSH: 0.84 mIU/L

## 2022-12-18 DIAGNOSIS — M25521 Pain in right elbow: Secondary | ICD-10-CM | POA: Diagnosis not present

## 2022-12-18 DIAGNOSIS — M21951 Unspecified acquired deformity of right thigh: Secondary | ICD-10-CM | POA: Diagnosis not present

## 2022-12-18 DIAGNOSIS — S4991XA Unspecified injury of right shoulder and upper arm, initial encounter: Secondary | ICD-10-CM | POA: Diagnosis not present

## 2022-12-18 DIAGNOSIS — M958 Other specified acquired deformities of musculoskeletal system: Secondary | ICD-10-CM | POA: Diagnosis not present

## 2022-12-18 DIAGNOSIS — M79605 Pain in left leg: Secondary | ICD-10-CM | POA: Diagnosis not present

## 2022-12-31 ENCOUNTER — Ambulatory Visit: Payer: BC Managed Care – PPO | Admitting: Nurse Practitioner

## 2023-01-28 ENCOUNTER — Telehealth: Payer: Self-pay

## 2023-01-28 ENCOUNTER — Encounter: Payer: Self-pay | Admitting: Gastroenterology

## 2023-01-28 NOTE — Telephone Encounter (Signed)
Pt LVM in triage line asking about information to office to call and schedule her screening colonoscopy.  Per visit notes from TW in 09/2021, information provided to pt to [facility].   Pt voiced understanding and appreciation for call. Routing to provider for final review.

## 2023-02-17 ENCOUNTER — Ambulatory Visit (AMBULATORY_SURGERY_CENTER): Payer: BC Managed Care – PPO

## 2023-02-17 VITALS — Ht 62.0 in | Wt 192.0 lb

## 2023-02-17 DIAGNOSIS — Z1211 Encounter for screening for malignant neoplasm of colon: Secondary | ICD-10-CM

## 2023-02-17 MED ORDER — NA SULFATE-K SULFATE-MG SULF 17.5-3.13-1.6 GM/177ML PO SOLN: 1.0000 | Freq: Once | ORAL | 0 refills | Status: AC

## 2023-02-17 NOTE — Progress Notes (Signed)

## 2023-03-09 ENCOUNTER — Encounter: Payer: Self-pay | Admitting: Gastroenterology

## 2023-03-15 ENCOUNTER — Encounter: Payer: Self-pay | Admitting: Certified Registered Nurse Anesthetist

## 2023-03-16 DIAGNOSIS — H2522 Age-related cataract, morgagnian type, left eye: Secondary | ICD-10-CM | POA: Diagnosis not present

## 2023-03-16 DIAGNOSIS — H35371 Puckering of macula, right eye: Secondary | ICD-10-CM | POA: Diagnosis not present

## 2023-03-16 DIAGNOSIS — Z961 Presence of intraocular lens: Secondary | ICD-10-CM | POA: Diagnosis not present

## 2023-03-16 DIAGNOSIS — E113591 Type 2 diabetes mellitus with proliferative diabetic retinopathy without macular edema, right eye: Secondary | ICD-10-CM | POA: Diagnosis not present

## 2023-03-18 DIAGNOSIS — G2581 Restless legs syndrome: Secondary | ICD-10-CM | POA: Diagnosis not present

## 2023-03-18 DIAGNOSIS — E162 Hypoglycemia, unspecified: Secondary | ICD-10-CM | POA: Diagnosis not present

## 2023-03-18 DIAGNOSIS — K219 Gastro-esophageal reflux disease without esophagitis: Secondary | ICD-10-CM | POA: Diagnosis not present

## 2023-03-18 DIAGNOSIS — I10 Essential (primary) hypertension: Secondary | ICD-10-CM | POA: Diagnosis not present

## 2023-03-18 DIAGNOSIS — E119 Type 2 diabetes mellitus without complications: Secondary | ICD-10-CM | POA: Diagnosis not present

## 2023-03-20 ENCOUNTER — Encounter: Payer: Self-pay | Admitting: Gastroenterology

## 2023-03-20 ENCOUNTER — Ambulatory Visit: Payer: BC Managed Care – PPO | Admitting: Gastroenterology

## 2023-03-20 VITALS — BP 140/66 | HR 87 | Temp 98.0°F | Resp 12 | Ht 62.0 in | Wt 192.0 lb

## 2023-03-20 DIAGNOSIS — D128 Benign neoplasm of rectum: Secondary | ICD-10-CM | POA: Diagnosis not present

## 2023-03-20 DIAGNOSIS — Z1211 Encounter for screening for malignant neoplasm of colon: Secondary | ICD-10-CM

## 2023-03-20 MED ORDER — SODIUM CHLORIDE 0.9 % IV SOLN: 500.0000 mL | INTRAVENOUS | Status: DC

## 2023-03-20 NOTE — Progress Notes (Signed)
Called to room to assist during endoscopic procedure.  Patient ID and intended procedure confirmed with present staff. Received instructions for my participation in the procedure from the performing physician.  

## 2023-03-20 NOTE — Progress Notes (Signed)
1131  Pt experienced laryngeal spasm with jaw thrust  performed. Patient experiencing nausea and retching. Zofran 4 mg IV given. Robinul 0.2 mg IV given due large amount of secretions upon assessment.  MD made aware, vss

## 2023-03-20 NOTE — Op Note (Signed)
[facility] Patient Name: Melissa Braun Procedure Date: 03/20/2023 11:24 AM MRN: [id number] Endoscopist: Napoleon Form , MD, [id number] Age: 47 Referring MD:  Date of Birth: 1975-11-17 Gender: Female Account #: [id number] Procedure:                Colonoscopy Indications:              Screening for colorectal malignant neoplasm Medicines:                Monitored Anesthesia Care Procedure:                Pre-Anesthesia Assessment:                           - Prior to the procedure, a History and Physical                            was performed, and patient medications and                            allergies were reviewed. The patient's tolerance of                            previous anesthesia was also reviewed. The risks                            and benefits of the procedure and the sedation                            options and risks were discussed with the patient.                            All questions were answered, and informed consent                            was obtained. Prior Anticoagulants: The patient has                            taken no anticoagulant or antiplatelet agents. ASA                            Grade Assessment: II - A patient with mild systemic                            disease. After reviewing the risks and benefits,                            the patient was deemed in satisfactory condition to                            undergo the procedure.                           After obtaining informed consent, the colonoscope  was passed under direct vision. Throughout the                            procedure, the patient's blood pressure, pulse, and                            oxygen saturations were monitored continuously. The                            [id number]-HQ190L Colonoscope [id number] was introduced                            through the anus and advanced to the the cecum,                            identified  by appendiceal orifice and ileocecal                            valve. The colonoscopy was performed without                            difficulty. The patient tolerated the procedure                            well. The quality of the bowel preparation was                            good. The ileocecal valve, appendiceal orifice, and                            rectum were photographed. Scope In: 11:28:14 AM Scope Out: 11:44:55 AM Scope Withdrawal Time: 0 hours 11 minutes 37 seconds  Total Procedure Duration: 0 hours 16 minutes 41 seconds  Findings:                 The perianal and digital rectal examinations were                            normal.                           A 5 mm polyp was found in the rectum. The polyp was                            pedunculated and oozing from surface ulceration.                            The polyp was removed with a hot snare. Resection                            and retrieval were complete.                           A 1 mm polyp was found in the rectum. The polyp was  sessile. The polyp was removed with a cold biopsy                            forceps. Resection and retrieval were complete.                           Non-bleeding external and internal hemorrhoids were                            found during retroflexion. The hemorrhoids were                            medium-sized. Complications:            No immediate complications. Estimated Blood Loss:     Estimated blood loss was minimal. Impression:               - One 5 mm polyp in the rectum, removed with a hot                            snare. Resected and retrieved.                           - One 1 mm polyp in the rectum, removed with a cold                            biopsy forceps. Resected and retrieved.                           - Non-bleeding external and internal hemorrhoids. Recommendation:           - Patient has a contact number available for                             emergencies. The signs and symptoms of potential                            delayed complications were discussed with the                            patient. Return to normal activities tomorrow.                            Written discharge instructions were provided to the                            patient.                           - Resume previous diet.                           - Continue present medications.                           - Await pathology results.                           -  Repeat colonoscopy date to be determined after                            pending pathology results are reviewed for                            surveillance based on pathology results. Napoleon Form, MD 03/20/2023 11:57:49 AM This report has been signed electronically.

## 2023-03-20 NOTE — Patient Instructions (Signed)
Resume all of your previous medications today as ordered.  Read all of the handouts given to you by your recovery room nurse.  Read your discharge instructions.  YOU HAD AN ENDOSCOPIC PROCEDURE TODAY AT THE [facility]:   Refer to the procedure report that was given to you for any specific questions about what was found during the examination.  If the procedure report does not answer your questions, please call your gastroenterologist to clarify.  If you requested that your care partner not be given the details of your procedure findings, then the procedure report has been included in a sealed envelope for you to review at your convenience later.  YOU SHOULD EXPECT: Some feelings of bloating in the abdomen. Passage of more gas than usual.  Walking can help get rid of the air that was put into your GI tract during the procedure and reduce the bloating. If you had a lower endoscopy (such as a colonoscopy or flexible sigmoidoscopy) you may notice spotting of blood in your stool or on the toilet paper. If you underwent a bowel prep for your procedure, you may not have a normal bowel movement for a few days.  Please Note:  You might notice some irritation and congestion in your nose or some drainage.  This is from the oxygen used during your procedure.  There is no need for concern and it should clear up in a day or so.  SYMPTOMS TO REPORT IMMEDIATELY:  Following lower endoscopy (colonoscopy or flexible sigmoidoscopy):  Excessive amounts of blood in the stool  Significant tenderness or worsening of abdominal pains  Swelling of the abdomen that is new, acute  Fever of 100F or higher   For urgent or emergent issues, a gastroenterologist can be reached at any hour by calling [phone]. Do not use MyChart messaging for urgent concerns.    DIET:  We do recommend a small meal at first, but then you may proceed to your regular diet.  Drink plenty of fluids but you should avoid alcoholic  beverages for 24 hours.  ACTIVITY:  You should plan to take it easy for the rest of today and you should NOT DRIVE or use heavy machinery until tomorrow (because of the sedation medicines used during the test).    FOLLOW UP: Our staff will call the number listed on your records the next business day following your procedure.  We will call around 7:15- 8:00 am to check on you and address any questions or concerns that you may have regarding the information given to you following your procedure. If we do not reach you, we will leave a message.     If any biopsies were taken you will be contacted by phone or by letter within the next 1-3 weeks.  Please call us at [phone] if you have not heard about the biopsies in 3 weeks.    SIGNATURES/CONFIDENTIALITY: You and/or your care partner have signed paperwork which will be entered into your electronic medical record.  These signatures attest to the fact that that the information above on your After Visit Summary has been reviewed and is understood.  Full responsibility of the confidentiality of this discharge information lies with you and/or your care-partner.

## 2023-03-20 NOTE — Progress Notes (Signed)
Pt's states no medical or surgical changes since previsit or office visit. 

## 2023-03-20 NOTE — Progress Notes (Signed)
Gastroenterology History and Physical   Primary Care Physician:  Rometta Emery, MD   Reason for Procedure:  Colorectal cancer screening  Plan:    Screening colonoscopy with possible interventions as needed     HPI: Melissa Braun is a very pleasant 47 y.o. female here for screening colonoscopy. Denies any nausea, vomiting, abdominal pain, melena or bright red blood per rectum  The risks and benefits as well as alternatives of endoscopic procedure(s) have been discussed and reviewed. All questions answered. The patient agrees to proceed.    Past Medical History:  Diagnosis Date   Abnormal Pap smear    Acne    Allergy    Anemia    Cataract    Diabetes mellitus without complication (HCC)    Heart murmur    History of hiatal hernia    No problems since gastric bypass   Infertility    LGSIL (low grade squamous intraepithelial dysplasia) 2008   PONV (postoperative nausea and vomiting)    Sickle cell trait (HCC)     Past Surgical History:  Procedure Laterality Date   CATARACT EXTRACTION Right 2024   CESAREAN SECTION N/A 09/04/2012   Procedure: Primary cesarean section with delivery of baby boy at 1126.  Apgars 5/7.;  Surgeon: Lesly Dukes, MD;  Location: [facility];  Service: Obstetrics;  Laterality: N/A;   CESAREAN SECTION WITH BILATERAL TUBAL LIGATION Bilateral 03/28/2015   Procedure: CESAREAN SECTION WITH BILATERAL TUBAL LIGATION;  Surgeon: Catalina Antigua, MD;  Location: [facility];  Service: Obstetrics;  Laterality: Bilateral;   EXPLORATORY LAPAROSCOPY     DUE TO RLQ PAIN   GASTRIC BYPASS  03/26/2010   cary [place]    Prior to Admission medications   Medication Sig Start Date End Date Taking? Authorizing Provider  ACCU-CHEK FASTCLIX LANCETS MISC 1 Units by Percutaneous route 4 (four) times daily. 05/14/12  Yes Lesly Dukes, MD  Ascorbic Acid (VITAMIN C) 1000 MG tablet Take 1,000 mg by mouth daily.   Yes [provider]  BD INSULIN SYRINGE U/F 31G X  5/16" 0.5 ML MISC USE FOR INSULIN TWICE DAILY 02/10/23  Yes [provider]  calcium-vitamin D (OSCAL WITH D) 500-5 MG-MCG tablet Take 1 tablet by mouth.   Yes [provider]  Cholecalciferol (VITAMIN D) 2000 UNITS tablet Take 1 tablet (2,000 Units total) by mouth daily. 09/29/14  Yes Lesly Dukes, MD  glucose blood (ACCU-CHEK SMARTVIEW) test strip Check blood sugars 4x/daily 05/14/12  Yes Lesly Dukes, MD  HUMALOG 100 UNIT/ML injection SMARTSIG:5 Unit(s) SUB-Q 3 Times Daily 01/29/23  Yes [provider]  insulin glargine (LANTUS) 100 UNIT/ML injection Inject 15 Units into the skin at bedtime.   Yes [provider]  insulin lispro (HUMALOG) 100 UNIT/ML cartridge Inject 4 Units into the skin 3 (three) times daily with meals.   Yes [provider]  iron polysaccharides (NIFEREX) 150 MG capsule Take 150 mg by mouth daily.   Yes [provider]  montelukast (SINGULAIR) 10 MG tablet Take 10 mg by mouth every evening. 09/08/17  Yes [provider]  spironolactone (ALDACTONE) 100 MG tablet Take 100 mg by mouth 2 (two) times daily.   Yes [provider]  tretinoin (RETIN-A) 0.05 % cream Apply topically. 02/16/23  Yes [provider]  XIIDRA 5 % SOLN Apply 1 drop to eye 2 (two) times daily. 08/09/21  Yes [provider]  Zinc 50 MG TABS Take by mouth.   Yes [provider]  OZEMPIC, 0.25 OR 0.5 MG/DOSE, 2 MG/3ML SOPN SMARTSIG:0.25 Milliliter(s) SUB-Q Once a Week 03/12/23   [provider]  insulin regular (HUMULIN R,NOVOLIN R) 100 units/mL injection Inject into the skin. 8/8   08/14/11  [provider]  LISINOPRIL PO Take by mouth.    08/14/11  [provider]    Current Outpatient Medications  Medication Sig Dispense Refill   ACCU-CHEK FASTCLIX LANCETS MISC 1 Units by Percutaneous route 4 (four) times daily. 100 each 12   Ascorbic Acid (VITAMIN C) 1000 MG tablet Take 1,000 mg by  mouth daily.     BD INSULIN SYRINGE U/F 31G X 5/16" 0.5 ML MISC USE FOR INSULIN TWICE DAILY     calcium-vitamin D (OSCAL WITH D) 500-5 MG-MCG tablet Take 1 tablet by mouth.     Cholecalciferol (VITAMIN D) 2000 UNITS tablet Take 1 tablet (2,000 Units total) by mouth daily. 30 tablet 3   glucose blood (ACCU-CHEK SMARTVIEW) test strip Check blood sugars 4x/daily 100 each 12   HUMALOG 100 UNIT/ML injection SMARTSIG:5 Unit(s) SUB-Q 3 Times Daily     insulin glargine (LANTUS) 100 UNIT/ML injection Inject 15 Units into the skin at bedtime.     insulin lispro (HUMALOG) 100 UNIT/ML cartridge Inject 4 Units into the skin 3 (three) times daily with meals.     iron polysaccharides (NIFEREX) 150 MG capsule Take 150 mg by mouth daily.     montelukast (SINGULAIR) 10 MG tablet Take 10 mg by mouth every evening.  5   spironolactone (ALDACTONE) 100 MG tablet Take 100 mg by mouth 2 (two) times daily.     tretinoin (RETIN-A) 0.05 % cream Apply topically.     XIIDRA 5 % SOLN Apply 1 drop to eye 2 (two) times daily.     Zinc 50 MG TABS Take by mouth.     OZEMPIC, 0.25 OR 0.5 MG/DOSE, 2 MG/3ML SOPN SMARTSIG:0.25 Milliliter(s) SUB-Q Once a Week     Current Facility-Administered Medications  Medication Dose Route Frequency Provider Last Rate Last Admin   0.9 %  sodium chloride infusion  500 mL Intravenous Continuous Boykin Baetz, Eleonore Chiquito, MD        Allergies as of 03/20/2023 - Review Complete 03/20/2023  Allergen Reaction Noted   Bactrim Hives 08/17/2011   Doxycycline Hives 08/17/2011   Levofloxacin Itching and Other (See Comments)    Levofloxacin Other (See Comments) 02/17/2023    Family History  Problem Relation Age of Onset   Sickle cell anemia Mother    Anemia Mother    Diabetes Father    Hypertension Father    Cancer Father        prostate and mouth   Sickle cell trait Sister    Hypertension Sister    Colon cancer Neg Hx    Colon polyps Neg Hx    Esophageal cancer Neg Hx    Rectal cancer Neg Hx     Stomach cancer Neg Hx     Social History   Socioeconomic History   Marital status: Married    Spouse name: Not on file   Number of children: 4   Years of education: Not on file   Highest education level: Not on file  Occupational History   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  Tobacco Use   Smoking status: Never   Smokeless tobacco: Never  Vaping Use   Vaping status: Never Used  Substance and Sexual Activity   Alcohol use: Yes    Comment: Social  Drug use: No   Sexual activity: Yes    Birth control/protection: Surgical    Comment: TUBAL LIGATION-1st intercourse 47 yo-More than 5 partners  Other Topics Concern   Not on file  Social History Narrative   Not on file   Social Determinants of Health   Financial Resource Strain: Not on file  Food Insecurity: Not on file  Transportation Needs: Not on file  Physical Activity: Not on file  Stress: Not on file  Social Connections: Unknown (09/25/2021)   Received from [facility], [facility]   Social Network    Social Network: Not on file  Intimate Partner Violence: Unknown (08/28/2021)   Received from [facility], [facility]   HITS    Physically Hurt: Not on file    Insult or Talk Down To: Not on file    Threaten Physical Harm: Not on file    Scream or Curse: Not on file    Review of Systems:  All other review of systems negative except as mentioned in the HPI.  Physical Exam: Vital signs in last 24 hours: BP 136/64   Pulse 86   Temp 98 F (36.7 C)   Ht 5\' 2"  (1.575 m)   Wt 192 lb (87.1 kg)   SpO2 100%   Breastfeeding No   BMI 35.12 kg/m  General:   Alert, NAD Lungs:  Clear .   Heart:  Regular rate and rhythm Abdomen:  Soft, nontender and nondistended. Neuro/Psych:  Alert and cooperative. Normal mood and affect. A and O x 3  Reviewed labs, radiology imaging, old records and pertinent past GI work up  Patient is appropriate for planned procedure(s) and anesthesia in an ambulatory setting   K.  Scherry Ran , MD [phone]

## 2023-03-20 NOTE — Progress Notes (Signed)
Report given to PACU, vss 

## 2023-03-23 ENCOUNTER — Encounter: Payer: Self-pay | Admitting: Nurse Practitioner

## 2023-03-23 ENCOUNTER — Telehealth: Payer: Self-pay

## 2023-03-23 ENCOUNTER — Other Ambulatory Visit (HOSPITAL_COMMUNITY)
Admission: RE | Admit: 2023-03-23 | Discharge: 2023-03-23 | Disposition: A | Discharge status: Home / Self Care | Payer: BC Managed Care – PPO | Source: Ambulatory Visit | Attending: Nurse Practitioner | Admitting: Nurse Practitioner

## 2023-03-23 ENCOUNTER — Ambulatory Visit (INDEPENDENT_AMBULATORY_CARE_PROVIDER_SITE_OTHER): Payer: BC Managed Care – PPO | Admitting: Nurse Practitioner

## 2023-03-23 VITALS — BP 110/70 | HR 89 | Ht 60.25 in | Wt 204.0 lb

## 2023-03-23 DIAGNOSIS — Z01419 Encounter for gynecological examination (general) (routine) without abnormal findings: Secondary | ICD-10-CM

## 2023-03-23 DIAGNOSIS — Z124 Encounter for screening for malignant neoplasm of cervix: Secondary | ICD-10-CM | POA: Diagnosis not present

## 2023-03-23 DIAGNOSIS — N951 Menopausal and female climacteric states: Secondary | ICD-10-CM | POA: Diagnosis not present

## 2023-03-23 NOTE — Progress Notes (Signed)
   EDUARDA RIZZOLO 04-23-1976 [id number]   History:  47 y.o. [id number]+1 presents for annual exam. Perimenopausal. LMP June 2024. 2008 LGSIL, 2021 ASCUS negative HPV. Diabetes managed by PCP.   Gynecologic History No LMP recorded. (Menstrual status: Perimenopausal).   Contraception/Family planning: tubal ligation and vasectomy Sexually active: Yes  Health Maintenance Last Pap: 08/22/2020. Results were: Normal, 3-year repeat Last mammogram: 09/25/2022. Results were: Normal Last colonoscopy: 03/20/2023. Waiting on pathology Last Dexa: Not indicated  Past medical history, past surgical history, family history and social history were all reviewed and documented in the EPIC chart. Married. 2 biological children and 2 adopted. Working remote for [employer].   ROS:  A ROS was performed and pertinent positives and negatives are included.  Exam:  Vitals:   03/23/23 1422  BP: 110/70  Pulse: 89  SpO2: 97%  Weight: 204 lb (92.5 kg)  Height: 5' 0.25" (1.53 m)     Body mass index is 39.51 kg/m.  General appearance:  Normal Thyroid:  Symmetrical, normal in size, without palpable masses or nodularity. Respiratory  Auscultation:  Clear without wheezing or rhonchi Cardiovascular  Auscultation:  Regular rate, without rubs, murmurs or gallops  Edema/varicosities:  Not grossly evident Abdominal  Soft,nontender, without masses, guarding or rebound.  Liver/spleen:  No organomegaly noted  Hernia:  None appreciated  Skin  Inspection:  Grossly normal   Breasts: Examined lying and sitting.   Right: Without masses, retractions, discharge or axillary adenopathy.   Left: Without masses, retractions, discharge or axillary adenopathy. Pelvic: External genitalia:  no lesions              Urethra:  normal appearing urethra with no masses, tenderness or lesions              Bartholins and Skenes: normal                 Vagina: normal appearing vagina with normal color and discharge, no lesions               Cervix: no lesions Bimanual Exam:  Uterus:  no masses or tenderness              Adnexa: no mass, fullness, tenderness              Rectovaginal: Deferred              Anus:  normal, no lesions  Patient informed chaperone available to be present for breast and pelvic exam. Patient has requested no chaperone to be present. Patient has been advised what will be completed during breast and pelvic exam.   Assessment/Plan:  47 y.o. [id number]+2 for annual exam.   Well female exam with routine gynecological exam - Education provided on SBEs, importance of preventative screenings, current guidelines, high calcium diet, regular exercise, and multivitamin daily. Labs with PCP.   Perimenopausal - LMP June 2024. Denies symptoms.   Cervical cancer screening - Plan: Cytology - PAP( [facility]). 2008 LGSIL, 10/2016 ASCUS negative HPV.   Screening for breast cancer - Normal mammogram history.  Continue annual screenings.  Normal breast exam today.  Screening for colon cancer - Colonoscopy last week. Waiting on pathology.   Return in about 1 year (around 03/22/2024) for Annual.   Olivia Mackie DNP, 3:13 PM 03/23/2023

## 2023-03-23 NOTE — Telephone Encounter (Signed)
  Follow up Call-     03/20/2023   10:59 AM  Call back number  Post procedure Call Back phone  # [phone]  Permission to leave phone message Yes     Patient questions:  Do you have a fever, pain , or abdominal swelling? No. Pain Score  0 *  Have you tolerated food without any problems? Yes.    Have you been able to return to your normal activities? Yes.    Do you have any questions about your discharge instructions: Diet   No. Medications  No. Follow up visit  No.  Do you have questions or concerns about your Care? No.  Actions: * If pain score is 4 or above: No action needed, pain <4.

## 2023-03-25 DIAGNOSIS — Z23 Encounter for immunization: Secondary | ICD-10-CM | POA: Diagnosis not present

## 2023-03-25 LAB — CYTOLOGY - PAP
Comment: NEGATIVE
Diagnosis: NEGATIVE
High risk HPV: NEGATIVE

## 2023-03-25 LAB — SURGICAL PATHOLOGY

## 2023-03-30 ENCOUNTER — Encounter: Payer: Self-pay | Admitting: Gastroenterology

## 2023-04-08 DIAGNOSIS — H2512 Age-related nuclear cataract, left eye: Secondary | ICD-10-CM | POA: Diagnosis not present

## 2023-04-08 DIAGNOSIS — H35371 Puckering of macula, right eye: Secondary | ICD-10-CM | POA: Diagnosis not present

## 2023-04-08 DIAGNOSIS — E113591 Type 2 diabetes mellitus with proliferative diabetic retinopathy without macular edema, right eye: Secondary | ICD-10-CM | POA: Diagnosis not present

## 2023-06-09 DIAGNOSIS — E119 Type 2 diabetes mellitus without complications: Secondary | ICD-10-CM | POA: Diagnosis not present

## 2023-06-09 DIAGNOSIS — I10 Essential (primary) hypertension: Secondary | ICD-10-CM | POA: Diagnosis not present

## 2023-06-09 DIAGNOSIS — M25561 Pain in right knee: Secondary | ICD-10-CM | POA: Diagnosis not present

## 2023-06-11 DIAGNOSIS — L719 Rosacea, unspecified: Secondary | ICD-10-CM | POA: Diagnosis not present

## 2023-06-11 DIAGNOSIS — Z79899 Other long term (current) drug therapy: Secondary | ICD-10-CM | POA: Diagnosis not present

## 2023-06-11 DIAGNOSIS — L700 Acne vulgaris: Secondary | ICD-10-CM | POA: Diagnosis not present

## 2023-06-11 DIAGNOSIS — L2084 Intrinsic (allergic) eczema: Secondary | ICD-10-CM | POA: Diagnosis not present

## 2023-06-19 DIAGNOSIS — M25461 Effusion, right knee: Secondary | ICD-10-CM | POA: Diagnosis not present

## 2023-06-19 DIAGNOSIS — S8001XA Contusion of right knee, initial encounter: Secondary | ICD-10-CM | POA: Diagnosis not present

## 2023-07-02 DIAGNOSIS — E119 Type 2 diabetes mellitus without complications: Secondary | ICD-10-CM | POA: Diagnosis not present

## 2023-07-02 DIAGNOSIS — S83281S Other tear of lateral meniscus, current injury, right knee, sequela: Secondary | ICD-10-CM | POA: Diagnosis not present

## 2023-07-14 ENCOUNTER — Ambulatory Visit: Payer: BC Managed Care – PPO | Admitting: Orthopaedic Surgery

## 2023-07-14 DIAGNOSIS — M1711 Unilateral primary osteoarthritis, right knee: Secondary | ICD-10-CM | POA: Diagnosis not present

## 2023-07-14 MED ORDER — MELOXICAM 7.5 MG PO TABS: 7.5000 mg | ORAL_TABLET | Freq: Two times a day (BID) | ORAL | 2 refills | Status: AC | PRN

## 2023-07-14 NOTE — Progress Notes (Signed)
Office Visit Note   Patient: Melissa Braun           Date of Birth: 1975/12/29           MRN: [id number] Visit Date: 07/14/2023              Requested by: Rometta Emery, MD [address] [place],  [place] [address] PCP: Rometta Emery, MD   Assessment & Plan: Visit Diagnoses:  1. Primary osteoarthritis of right knee     Plan: [name] is a 48 year old female with right knee pain.  MRI from [facility] reviewed.  Relatively unimpressive findings.  Cannot exactly explain her symptoms based on the MRI.  Various treatment options were offered and she would like to try a course of home exercises and prescription for meloxicam.  She politely declined physical therapy and cortisone injection today.  I will see her back if symptoms persist.  Follow-Up Instructions: No follow-ups on file.   Orders:  No orders of the defined types were placed in this encounter.  Meds ordered this encounter  Medications   meloxicam (MOBIC) 7.5 MG tablet    Sig: Take 1 tablet (7.5 mg total) by mouth 2 (two) times daily as needed for pain.    Dispense:  30 tablet    Refill:  2      Procedures: No procedures performed   Clinical Data: No additional findings.   Subjective: Chief Complaint  Patient presents with   Right Knee - Pain    HPI Patient is a 48 year old female here for evaluation of right knee pain.  Reports falling a year ago.  Her symptoms have gotten worse in the last month.  Feels a catching giving way and anterior knee pain.  Had an MRI done at [facility] which is available for review.  Denies any swelling. Review of Systems  Constitutional: Negative.   HENT: Negative.    Eyes: Negative.   Respiratory: Negative.    Cardiovascular: Negative.   Endocrine: Negative.   Musculoskeletal:  Positive for arthralgias.  Neurological: Negative.   Hematological: Negative.   Psychiatric/Behavioral: Negative.    All other systems reviewed and are negative.    Objective: Vital  Signs: There were no vitals taken for this visit.  Physical Exam Vitals and nursing note reviewed.  Constitutional:      Appearance: She is well-developed.  HENT:     Head: Atraumatic.     Nose: Nose normal.  Eyes:     Extraocular Movements: Extraocular movements intact.  Cardiovascular:     Pulses: Normal pulses.  Pulmonary:     Effort: Pulmonary effort is normal.  Abdominal:     Palpations: Abdomen is soft.  Musculoskeletal:     Cervical back: Neck supple.  Skin:    General: Skin is warm.     Capillary Refill: Capillary refill takes less than 2 seconds.  Neurological:     Mental Status: She is alert. Mental status is at baseline.  Psychiatric:        Behavior: Behavior normal.        Thought Content: Thought content normal.        Judgment: Judgment normal.     Ortho Exam Examination of right knee shows mild valgus alignment.  No joint line tenderness.  Collaterals and cruciates are stable.  No joint effusion. Specialty Comments:  No specialty comments available.  Imaging: No results found.   PMFS History: Patient Active Problem List   Diagnosis Date Noted  Edema 03/30/2015   Hypoxemia 03/30/2015   H/O gastric bypass    Obesity affecting pregnancy, antepartum    Diabetic retinopathy associated with type 1 diabetes mellitus (HCC) 10/02/2014   Asymptomatic bacteriuria 08/28/2014   Anemia 08/21/2014   History of pre-eclampsia in prior pregnancy, currently pregnant 09/07/2012   Previous cesarean delivery affecting pregnancy 09/07/2012   Vitamin deficiency 04/25/2012   Sickle cell trait (HCC) 03/10/2012   Diabetes mellitus during pregnancy, antepartum 02/23/2012   Previous gastric bypass affecting pregnancy, antepartum 02/23/2012   Essential hypertension, benign 02/23/2012   Obesity 02/23/2012   Supervision of high risk pregnancy, antepartum 02/23/2012   Diabetes 1.5, 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/01/2012   Past Medical History:  Diagnosis Date   Abnormal Pap  smear    Acne    Allergy    Anemia    Cataract    Diabetes mellitus without complication (HCC)    Heart murmur    History of hiatal hernia    No problems since gastric bypass   Infertility    LGSIL (low grade squamous intraepithelial dysplasia) 2008   PONV (postoperative nausea and vomiting)    Sickle cell trait (HCC)     Family History  Problem Relation Age of Onset   Sickle cell anemia Mother    Anemia Mother    Diabetes Father    Hypertension Father    Cancer Father        prostate and mouth   Sickle cell trait Sister    Hypertension Sister    Colon cancer Neg Hx    Colon polyps Neg Hx    Esophageal cancer Neg Hx    Rectal cancer Neg Hx    Stomach cancer Neg Hx     Past Surgical History:  Procedure Laterality Date   CATARACT EXTRACTION Right 2024   CESAREAN SECTION N/A 09/04/2012   Procedure: Primary cesarean section with delivery of baby boy at 1126.  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

## 2023-09-04 DIAGNOSIS — H1045 Other chronic allergic conjunctivitis: Secondary | ICD-10-CM | POA: Diagnosis not present

## 2023-09-04 DIAGNOSIS — J3089 Other allergic rhinitis: Secondary | ICD-10-CM | POA: Diagnosis not present

## 2023-09-04 DIAGNOSIS — L2089 Other atopic dermatitis: Secondary | ICD-10-CM | POA: Diagnosis not present

## 2023-09-09 ENCOUNTER — Telehealth: Payer: Self-pay | Admitting: Orthopaedic Surgery

## 2023-09-09 DIAGNOSIS — E113591 Type 2 diabetes mellitus with proliferative diabetic retinopathy without macular edema, right eye: Secondary | ICD-10-CM | POA: Diagnosis not present

## 2023-09-09 DIAGNOSIS — H35371 Puckering of macula, right eye: Secondary | ICD-10-CM | POA: Diagnosis not present

## 2023-09-09 DIAGNOSIS — H16223 Keratoconjunctivitis sicca, not specified as Sjogren's, bilateral: Secondary | ICD-10-CM | POA: Diagnosis not present

## 2023-09-09 NOTE — Telephone Encounter (Signed)
 Pt came by the office to see if she can pick up a disc that she dropped off at her last visit on 07/14/2023.  Pt also would like to see if she could get a copy of the exercises that was given to her at that visit as well. Pt would like to have a call back once it is found so that she can come by and pick it up.

## 2023-09-09 NOTE — Telephone Encounter (Signed)
 Spoke with patient. Both the disc and exercises are up front for pick up.

## 2023-10-05 DIAGNOSIS — H31091 Other chorioretinal scars, right eye: Secondary | ICD-10-CM | POA: Diagnosis not present

## 2023-10-05 DIAGNOSIS — E113591 Type 2 diabetes mellitus with proliferative diabetic retinopathy without macular edema, right eye: Secondary | ICD-10-CM | POA: Diagnosis not present

## 2023-10-05 DIAGNOSIS — H26491 Other secondary cataract, right eye: Secondary | ICD-10-CM | POA: Diagnosis not present

## 2023-10-05 DIAGNOSIS — H2512 Age-related nuclear cataract, left eye: Secondary | ICD-10-CM | POA: Diagnosis not present

## 2023-10-12 DIAGNOSIS — L2089 Other atopic dermatitis: Secondary | ICD-10-CM | POA: Diagnosis not present

## 2023-10-12 DIAGNOSIS — H1045 Other chronic allergic conjunctivitis: Secondary | ICD-10-CM | POA: Diagnosis not present

## 2023-10-12 DIAGNOSIS — J3089 Other allergic rhinitis: Secondary | ICD-10-CM | POA: Diagnosis not present

## 2023-10-23 ENCOUNTER — Other Ambulatory Visit: Payer: Self-pay | Admitting: Nurse Practitioner

## 2023-10-23 ENCOUNTER — Ambulatory Visit

## 2023-10-23 DIAGNOSIS — Z0000 Encounter for general adult medical examination without abnormal findings: Secondary | ICD-10-CM

## 2023-10-26 ENCOUNTER — Ambulatory Visit
Admission: RE | Admit: 2023-10-26 | Discharge: 2023-10-26 | Disposition: A | Discharge status: Home / Self Care | Source: Ambulatory Visit | Attending: Nurse Practitioner | Admitting: Nurse Practitioner

## 2023-10-26 DIAGNOSIS — Z0000 Encounter for general adult medical examination without abnormal findings: Secondary | ICD-10-CM

## 2023-12-14 DIAGNOSIS — M25561 Pain in right knee: Secondary | ICD-10-CM | POA: Diagnosis not present

## 2023-12-18 DIAGNOSIS — M222X1 Patellofemoral disorders, right knee: Secondary | ICD-10-CM | POA: Diagnosis not present

## 2023-12-18 DIAGNOSIS — M25561 Pain in right knee: Secondary | ICD-10-CM | POA: Diagnosis not present

## 2023-12-29 DIAGNOSIS — M222X1 Patellofemoral disorders, right knee: Secondary | ICD-10-CM | POA: Diagnosis not present

## 2023-12-29 DIAGNOSIS — M25561 Pain in right knee: Secondary | ICD-10-CM | POA: Diagnosis not present

## 2024-01-07 DIAGNOSIS — M222X1 Patellofemoral disorders, right knee: Secondary | ICD-10-CM | POA: Diagnosis not present

## 2024-01-07 DIAGNOSIS — M25561 Pain in right knee: Secondary | ICD-10-CM | POA: Diagnosis not present

## 2024-01-11 DIAGNOSIS — M25561 Pain in right knee: Secondary | ICD-10-CM | POA: Diagnosis not present

## 2024-01-11 DIAGNOSIS — M222X1 Patellofemoral disorders, right knee: Secondary | ICD-10-CM | POA: Diagnosis not present

## 2024-01-14 DIAGNOSIS — M25561 Pain in right knee: Secondary | ICD-10-CM | POA: Diagnosis not present

## 2024-01-14 DIAGNOSIS — M222X1 Patellofemoral disorders, right knee: Secondary | ICD-10-CM | POA: Diagnosis not present

## 2024-01-27 DIAGNOSIS — M222X1 Patellofemoral disorders, right knee: Secondary | ICD-10-CM | POA: Diagnosis not present

## 2024-02-10 ENCOUNTER — Other Ambulatory Visit: Payer: Self-pay | Admitting: Nurse Practitioner

## 2024-02-10 DIAGNOSIS — Z1231 Encounter for screening mammogram for malignant neoplasm of breast: Secondary | ICD-10-CM

## 2024-02-24 DIAGNOSIS — Z13220 Encounter for screening for lipoid disorders: Secondary | ICD-10-CM | POA: Diagnosis not present

## 2024-02-24 DIAGNOSIS — E162 Hypoglycemia, unspecified: Secondary | ICD-10-CM | POA: Diagnosis not present

## 2024-03-24 ENCOUNTER — Ambulatory Visit: Payer: BC Managed Care – PPO | Admitting: Nurse Practitioner

## 2024-03-24 ENCOUNTER — Encounter: Payer: Self-pay | Admitting: Nurse Practitioner

## 2024-03-24 VITALS — BP 124/80 | HR 86 | Ht 61.25 in | Wt 209.0 lb

## 2024-03-24 DIAGNOSIS — N951 Menopausal and female climacteric states: Secondary | ICD-10-CM

## 2024-03-24 DIAGNOSIS — Z1331 Encounter for screening for depression: Secondary | ICD-10-CM

## 2024-03-24 DIAGNOSIS — Z01419 Encounter for gynecological examination (general) (routine) without abnormal findings: Secondary | ICD-10-CM

## 2024-03-24 NOTE — Progress Notes (Signed)
   Melissa Braun 06-24-1975 [id number]   History:  48 y.o. G2P2002 presents for annual exam. Perimenopausal. LMP Jan 2025. Denies menopausal symptoms. 2008 LGSIL, 2018 ASCUS negative HPV. Diabetes managed by PCP.   Gynecologic History No LMP recorded. (Menstrual status: Perimenopausal).   Contraception/Family planning: tubal ligation and vasectomy Sexually active: Yes  Health Maintenance Last Pap: 03/23/2023. Results were: Normal neg HPV Last mammogram: 10/26/2023. Results were: Normal Last colonoscopy: 03/20/2023. Results were: Tubular adenoma Last Dexa: Not indicated     03/24/2024    2:36 PM  Depression screen PHQ 2/9  Decreased Interest 0  Down, Depressed, Hopeless 0  PHQ - 2 Score 0     Past medical history, past surgical history, family history and social history were all reviewed and documented in the EPIC chart. Married. 2 biological children and 2 adopted. Working remote for [employer].   ROS:  A ROS was performed and pertinent positives and negatives are included.  Exam:  Vitals:   03/24/24 1429  BP: 124/80  Pulse: 86  SpO2: 96%  Weight: 209 lb (94.8 kg)  Height: 5' 1.25 (1.556 m)      Body mass index is 39.17 kg/m.  General appearance:  Normal Thyroid :  Symmetrical, normal in size, without palpable masses or nodularity. Respiratory  Auscultation:  Clear without wheezing or rhonchi Cardiovascular  Auscultation:  Regular rate, without rubs, murmurs or gallops  Edema/varicosities:  Not grossly evident Abdominal  Soft,nontender, without masses, guarding or rebound.  Liver/spleen:  No organomegaly noted  Hernia:  None appreciated  Skin  Inspection:  Grossly normal   Breasts: Examined lying and sitting.   Right: Without masses, retractions, discharge or axillary adenopathy.   Left: Without masses, retractions, discharge or axillary adenopathy. Pelvic: External genitalia:  no lesions              Urethra:  normal appearing urethra with no masses,  tenderness or lesions              Bartholins and Skenes: normal                 Vagina: normal appearing vagina with normal color and discharge, no lesions              Cervix: no lesions Bimanual Exam:  Uterus:  no masses or tenderness              Adnexa: no mass, fullness, tenderness              Rectovaginal: Deferred              Anus:  normal, no lesions  Kari Leaven, CMA present as chaperone.   Assessment/Plan:  48 y.o. [id number] for annual exam.   Well female exam with routine gynecological exam - Education provided on SBEs, importance of preventative screenings, current guidelines, high calcium diet, regular exercise, and multivitamin daily. Labs with PCP.   Perimenopausal - LMP Jan 2025. Denies menopausal symptoms.   Cervical cancer screening - 2008 LGSIL, 2018 ASCUS negative HPV. Will repeat at 5-year interval per guidelnes.   Screening for breast cancer - Normal mammogram history.  Continue annual screenings.  Normal breast exam today.  Screening for colon cancer - 2024 colonoscopy. Will repeat at GI's recommended interval.   Return in about 1 year (around 03/24/2025) for Annual.   Annabella DELENA Shutter DNP, 2:59 PM 03/24/2024

## 2024-03-25 DIAGNOSIS — H26491 Other secondary cataract, right eye: Secondary | ICD-10-CM | POA: Diagnosis not present

## 2024-03-25 DIAGNOSIS — M1711 Unilateral primary osteoarthritis, right knee: Secondary | ICD-10-CM | POA: Diagnosis not present

## 2024-03-28 ENCOUNTER — Encounter: Payer: Self-pay | Admitting: Radiology

## 2024-04-04 DIAGNOSIS — H2512 Age-related nuclear cataract, left eye: Secondary | ICD-10-CM | POA: Diagnosis not present

## 2024-04-04 DIAGNOSIS — H35371 Puckering of macula, right eye: Secondary | ICD-10-CM | POA: Diagnosis not present

## 2024-04-04 DIAGNOSIS — H31091 Other chorioretinal scars, right eye: Secondary | ICD-10-CM | POA: Diagnosis not present

## 2024-04-04 DIAGNOSIS — E113591 Type 2 diabetes mellitus with proliferative diabetic retinopathy without macular edema, right eye: Secondary | ICD-10-CM | POA: Diagnosis not present

## 2024-04-08 ENCOUNTER — Encounter: Payer: Self-pay | Admitting: Nurse Practitioner

## 2024-04-11 DIAGNOSIS — D649 Anemia, unspecified: Secondary | ICD-10-CM | POA: Diagnosis not present

## 2024-10-26 ENCOUNTER — Ambulatory Visit
